# Patient Record
Sex: Female | Born: 1965 | Race: Black or African American | Hispanic: Refuse to answer
Health system: Midwestern US, Community
[De-identification: ages and names within clinical notes are randomized; demographics above are authoritative.]

## PROBLEM LIST (undated history)

## (undated) DIAGNOSIS — I10 Essential (primary) hypertension: Secondary | ICD-10-CM

## (undated) DIAGNOSIS — Z973 Presence of spectacles and contact lenses: Secondary | ICD-10-CM

## (undated) DIAGNOSIS — D563 Thalassemia minor: Secondary | ICD-10-CM

## (undated) DIAGNOSIS — Z87442 Personal history of urinary calculi: Secondary | ICD-10-CM

## (undated) DIAGNOSIS — C50919 Malignant neoplasm of unspecified site of unspecified female breast: Secondary | ICD-10-CM

## (undated) DIAGNOSIS — K219 Gastro-esophageal reflux disease without esophagitis: Secondary | ICD-10-CM

## (undated) DIAGNOSIS — F319 Bipolar disorder, unspecified: Secondary | ICD-10-CM

## (undated) DIAGNOSIS — Z8719 Personal history of other diseases of the digestive system: Secondary | ICD-10-CM

## (undated) DIAGNOSIS — R51 Headache: Secondary | ICD-10-CM

## (undated) DIAGNOSIS — K859 Acute pancreatitis without necrosis or infection, unspecified: Secondary | ICD-10-CM

## (undated) DIAGNOSIS — U071 COVID-19: Secondary | ICD-10-CM

## (undated) DIAGNOSIS — I639 Cerebral infarction, unspecified: Secondary | ICD-10-CM

## (undated) DIAGNOSIS — I2510 Atherosclerotic heart disease of native coronary artery without angina pectoris: Secondary | ICD-10-CM

## (undated) DIAGNOSIS — IMO0001 Reserved for inherently not codable concepts without codable children: Secondary | ICD-10-CM

## (undated) DIAGNOSIS — T8859XA Other complications of anesthesia, initial encounter: Secondary | ICD-10-CM

## (undated) DIAGNOSIS — J45909 Unspecified asthma, uncomplicated: Secondary | ICD-10-CM

## (undated) DIAGNOSIS — R519 Headache, unspecified: Secondary | ICD-10-CM

## (undated) DIAGNOSIS — N200 Calculus of kidney: Secondary | ICD-10-CM

## (undated) DIAGNOSIS — D571 Sickle-cell disease without crisis: Secondary | ICD-10-CM

## (undated) DIAGNOSIS — M1990 Unspecified osteoarthritis, unspecified site: Secondary | ICD-10-CM

## (undated) DIAGNOSIS — J189 Pneumonia, unspecified organism: Secondary | ICD-10-CM

## (undated) DIAGNOSIS — J40 Bronchitis, not specified as acute or chronic: Secondary | ICD-10-CM

## (undated) DIAGNOSIS — G4730 Sleep apnea, unspecified: Secondary | ICD-10-CM

## (undated) DIAGNOSIS — F329 Major depressive disorder, single episode, unspecified: Secondary | ICD-10-CM

## (undated) DIAGNOSIS — F32A Depression, unspecified: Secondary | ICD-10-CM

## (undated) DIAGNOSIS — F450 Somatization disorder: Secondary | ICD-10-CM

## (undated) DIAGNOSIS — F419 Anxiety disorder, unspecified: Secondary | ICD-10-CM

## (undated) HISTORY — PX: TUBAL LIGATION: SHX77

## (undated) HISTORY — PX: PARTIAL HYSTERECTOMY: SHX80

## (undated) HISTORY — DX: Gastro-esophageal reflux disease without esophagitis: K21.9

## (undated) HISTORY — PX: MASTECTOMY: SHX3

## (undated) HISTORY — PX: CHOLECYSTECTOMY: SHX55

## (undated) HISTORY — PX: BREAST LUMPECTOMY: SHX2

## (undated) HISTORY — PX: PORT A CATH REVISION: SHX6033

## (undated) HISTORY — PX: CARDIAC CATHETERIZATION: SHX172

## (undated) HISTORY — PX: COLONOSCOPY W/ BIOPSIES AND POLYPECTOMY: SHX1376

---

## 1999-06-15 DIAGNOSIS — N951 Menopausal and female climacteric states: Secondary | ICD-10-CM | POA: Insufficient documentation

## 2000-06-17 DIAGNOSIS — K861 Other chronic pancreatitis: Secondary | ICD-10-CM | POA: Insufficient documentation

## 2000-10-04 DIAGNOSIS — B9681 Helicobacter pylori [H. pylori] as the cause of diseases classified elsewhere: Secondary | ICD-10-CM | POA: Insufficient documentation

## 2000-11-07 DIAGNOSIS — K279 Peptic ulcer, site unspecified, unspecified as acute or chronic, without hemorrhage or perforation: Secondary | ICD-10-CM | POA: Insufficient documentation

## 2001-10-30 DIAGNOSIS — S339XXA Sprain of unspecified parts of lumbar spine and pelvis, initial encounter: Secondary | ICD-10-CM | POA: Insufficient documentation

## 2001-10-30 DIAGNOSIS — S139XXA Sprain of joints and ligaments of unspecified parts of neck, initial encounter: Secondary | ICD-10-CM | POA: Insufficient documentation

## 2007-11-11 DIAGNOSIS — I10 Essential (primary) hypertension: Secondary | ICD-10-CM | POA: Insufficient documentation

## 2007-11-20 DIAGNOSIS — D571 Sickle-cell disease without crisis: Secondary | ICD-10-CM | POA: Insufficient documentation

## 2007-11-20 DIAGNOSIS — F1910 Other psychoactive substance abuse, uncomplicated: Secondary | ICD-10-CM | POA: Insufficient documentation

## 2007-11-26 ENCOUNTER — Ambulatory Visit: Payer: Self-pay | Admitting: Pulmonary Disease

## 2007-11-26 ENCOUNTER — Emergency Department (HOSPITAL_COMMUNITY): Admission: EM | Admit: 2007-11-26 | Discharge: 2007-11-26 | Payer: Self-pay | Admitting: Emergency Medicine

## 2007-11-26 DIAGNOSIS — L02219 Cutaneous abscess of trunk, unspecified: Secondary | ICD-10-CM | POA: Insufficient documentation

## 2007-11-26 DIAGNOSIS — R0602 Shortness of breath: Secondary | ICD-10-CM | POA: Insufficient documentation

## 2007-11-26 DIAGNOSIS — I10 Essential (primary) hypertension: Secondary | ICD-10-CM | POA: Insufficient documentation

## 2007-11-26 DIAGNOSIS — D568 Other thalassemias: Secondary | ICD-10-CM | POA: Insufficient documentation

## 2007-11-26 DIAGNOSIS — F17200 Nicotine dependence, unspecified, uncomplicated: Secondary | ICD-10-CM | POA: Insufficient documentation

## 2007-11-26 DIAGNOSIS — I2510 Atherosclerotic heart disease of native coronary artery without angina pectoris: Secondary | ICD-10-CM | POA: Insufficient documentation

## 2007-11-26 DIAGNOSIS — L03319 Cellulitis of trunk, unspecified: Secondary | ICD-10-CM

## 2007-12-16 ENCOUNTER — Ambulatory Visit: Payer: Self-pay | Admitting: Pulmonary Disease

## 2007-12-16 ENCOUNTER — Inpatient Hospital Stay (HOSPITAL_COMMUNITY): Admission: AD | Admit: 2007-12-16 | Discharge: 2007-12-25 | Payer: Self-pay | Admitting: Pulmonary Disease

## 2007-12-17 ENCOUNTER — Ambulatory Visit: Payer: Self-pay | Admitting: Gastroenterology

## 2007-12-18 ENCOUNTER — Encounter: Payer: Self-pay | Admitting: Gastroenterology

## 2008-01-13 ENCOUNTER — Ambulatory Visit: Payer: Self-pay | Admitting: Gastroenterology

## 2008-01-14 ENCOUNTER — Ambulatory Visit: Payer: Self-pay | Admitting: Pulmonary Disease

## 2008-01-14 DIAGNOSIS — K3184 Gastroparesis: Secondary | ICD-10-CM | POA: Insufficient documentation

## 2008-01-19 ENCOUNTER — Encounter: Payer: Self-pay | Admitting: Gastroenterology

## 2008-01-22 ENCOUNTER — Ambulatory Visit: Payer: Self-pay | Admitting: Pulmonary Disease

## 2008-01-22 ENCOUNTER — Emergency Department (HOSPITAL_COMMUNITY): Admission: EM | Admit: 2008-01-22 | Discharge: 2008-01-22 | Payer: Self-pay | Admitting: Emergency Medicine

## 2008-01-22 ENCOUNTER — Telehealth: Payer: Self-pay | Admitting: Emergency Medicine

## 2008-02-09 ENCOUNTER — Ambulatory Visit: Payer: Self-pay | Admitting: Gastroenterology

## 2008-02-09 ENCOUNTER — Encounter (INDEPENDENT_AMBULATORY_CARE_PROVIDER_SITE_OTHER): Payer: Self-pay | Admitting: *Deleted

## 2008-02-09 ENCOUNTER — Ambulatory Visit: Payer: Self-pay | Admitting: Cardiology

## 2008-02-09 DIAGNOSIS — Z860100 Personal history of colon polyps, unspecified: Secondary | ICD-10-CM | POA: Insufficient documentation

## 2008-02-09 DIAGNOSIS — R1013 Epigastric pain: Secondary | ICD-10-CM | POA: Insufficient documentation

## 2008-02-10 ENCOUNTER — Telehealth: Payer: Self-pay | Admitting: Gastroenterology

## 2008-02-10 ENCOUNTER — Encounter (INDEPENDENT_AMBULATORY_CARE_PROVIDER_SITE_OTHER): Payer: Self-pay | Admitting: Internal Medicine

## 2008-02-10 ENCOUNTER — Ambulatory Visit: Payer: Self-pay | Admitting: Surgery

## 2008-02-11 ENCOUNTER — Inpatient Hospital Stay (HOSPITAL_COMMUNITY): Admission: EM | Admit: 2008-02-11 | Discharge: 2008-02-12 | Payer: Self-pay | Admitting: Emergency Medicine

## 2008-02-16 ENCOUNTER — Telehealth: Payer: Self-pay | Admitting: Gastroenterology

## 2008-03-01 ENCOUNTER — Telehealth: Payer: Self-pay | Admitting: Gastroenterology

## 2008-04-26 ENCOUNTER — Emergency Department (HOSPITAL_COMMUNITY): Admission: EM | Admit: 2008-04-26 | Discharge: 2008-04-26 | Payer: Self-pay | Admitting: Emergency Medicine

## 2008-05-10 ENCOUNTER — Telehealth (INDEPENDENT_AMBULATORY_CARE_PROVIDER_SITE_OTHER): Payer: Self-pay | Admitting: *Deleted

## 2008-05-16 ENCOUNTER — Ambulatory Visit: Payer: Self-pay | Admitting: Internal Medicine

## 2008-05-16 ENCOUNTER — Inpatient Hospital Stay (HOSPITAL_COMMUNITY): Admission: EM | Admit: 2008-05-16 | Discharge: 2008-05-18 | Payer: Self-pay | Admitting: *Deleted

## 2008-05-17 ENCOUNTER — Encounter (INDEPENDENT_AMBULATORY_CARE_PROVIDER_SITE_OTHER): Payer: Self-pay | Admitting: *Deleted

## 2008-06-05 ENCOUNTER — Emergency Department (HOSPITAL_COMMUNITY): Admission: EM | Admit: 2008-06-05 | Discharge: 2008-06-05 | Payer: Self-pay | Admitting: Emergency Medicine

## 2008-06-19 ENCOUNTER — Inpatient Hospital Stay (HOSPITAL_COMMUNITY): Admission: EM | Admit: 2008-06-19 | Discharge: 2008-06-22 | Payer: Self-pay | Admitting: Emergency Medicine

## 2008-06-23 ENCOUNTER — Ambulatory Visit: Payer: Self-pay | Admitting: Gastroenterology

## 2008-07-01 ENCOUNTER — Emergency Department (HOSPITAL_COMMUNITY): Admission: EM | Admit: 2008-07-01 | Discharge: 2008-07-01 | Payer: Self-pay | Admitting: Emergency Medicine

## 2008-07-16 ENCOUNTER — Ambulatory Visit (HOSPITAL_COMMUNITY): Admission: RE | Admit: 2008-07-16 | Discharge: 2008-07-16 | Payer: Self-pay | Admitting: Cardiovascular Disease

## 2008-08-10 ENCOUNTER — Observation Stay (HOSPITAL_COMMUNITY): Admission: EM | Admit: 2008-08-10 | Discharge: 2008-08-11 | Payer: Self-pay | Admitting: Emergency Medicine

## 2008-08-23 ENCOUNTER — Observation Stay (HOSPITAL_COMMUNITY): Admission: EM | Admit: 2008-08-23 | Discharge: 2008-08-23 | Payer: Self-pay | Admitting: Emergency Medicine

## 2008-08-25 ENCOUNTER — Inpatient Hospital Stay (HOSPITAL_COMMUNITY): Admission: EM | Admit: 2008-08-25 | Discharge: 2008-08-26 | Payer: Self-pay | Admitting: Emergency Medicine

## 2008-08-31 ENCOUNTER — Inpatient Hospital Stay (HOSPITAL_COMMUNITY): Admission: EM | Admit: 2008-08-31 | Discharge: 2008-09-05 | Payer: Self-pay | Admitting: Emergency Medicine

## 2008-09-15 ENCOUNTER — Encounter: Payer: Self-pay | Admitting: Emergency Medicine

## 2008-09-16 ENCOUNTER — Ambulatory Visit: Payer: Self-pay | Admitting: Psychiatry

## 2008-09-16 ENCOUNTER — Inpatient Hospital Stay (HOSPITAL_COMMUNITY): Admission: AD | Admit: 2008-09-16 | Discharge: 2008-09-17 | Payer: Self-pay | Admitting: Psychiatry

## 2008-09-18 ENCOUNTER — Emergency Department (HOSPITAL_COMMUNITY): Admission: EM | Admit: 2008-09-18 | Discharge: 2008-09-18 | Payer: Self-pay | Admitting: Emergency Medicine

## 2008-10-15 ENCOUNTER — Emergency Department (HOSPITAL_COMMUNITY): Admission: EM | Admit: 2008-10-15 | Discharge: 2008-10-15 | Payer: Self-pay | Admitting: Emergency Medicine

## 2008-11-02 ENCOUNTER — Emergency Department (HOSPITAL_COMMUNITY): Admission: EM | Admit: 2008-11-02 | Discharge: 2008-11-02 | Payer: Self-pay | Admitting: Emergency Medicine

## 2008-11-17 ENCOUNTER — Emergency Department (HOSPITAL_COMMUNITY): Admission: EM | Admit: 2008-11-17 | Discharge: 2008-11-17 | Payer: Self-pay | Admitting: Emergency Medicine

## 2008-11-23 ENCOUNTER — Emergency Department (HOSPITAL_COMMUNITY): Admission: EM | Admit: 2008-11-23 | Discharge: 2008-11-23 | Payer: Self-pay | Admitting: Emergency Medicine

## 2008-11-25 ENCOUNTER — Emergency Department (HOSPITAL_COMMUNITY): Admission: EM | Admit: 2008-11-25 | Discharge: 2008-11-25 | Payer: Self-pay | Admitting: Emergency Medicine

## 2008-11-30 ENCOUNTER — Emergency Department (HOSPITAL_COMMUNITY): Admission: EM | Admit: 2008-11-30 | Discharge: 2008-11-30 | Payer: Self-pay | Admitting: Emergency Medicine

## 2009-02-14 ENCOUNTER — Inpatient Hospital Stay (HOSPITAL_COMMUNITY): Admission: EM | Admit: 2009-02-14 | Discharge: 2009-02-15 | Payer: Self-pay | Admitting: Emergency Medicine

## 2009-02-14 ENCOUNTER — Ambulatory Visit: Payer: Self-pay | Admitting: Family Medicine

## 2009-03-09 ENCOUNTER — Inpatient Hospital Stay (HOSPITAL_COMMUNITY): Admission: EM | Admit: 2009-03-09 | Discharge: 2009-03-11 | Payer: Self-pay | Admitting: Emergency Medicine

## 2009-03-16 ENCOUNTER — Emergency Department (HOSPITAL_COMMUNITY): Admission: EM | Admit: 2009-03-16 | Discharge: 2009-03-16 | Payer: Self-pay | Admitting: Emergency Medicine

## 2009-03-26 ENCOUNTER — Emergency Department (HOSPITAL_COMMUNITY): Admission: EM | Admit: 2009-03-26 | Discharge: 2009-03-26 | Payer: Self-pay | Admitting: Emergency Medicine

## 2009-03-31 ENCOUNTER — Emergency Department (HOSPITAL_COMMUNITY): Admission: EM | Admit: 2009-03-31 | Discharge: 2009-03-31 | Payer: Self-pay | Admitting: Family Medicine

## 2009-04-20 ENCOUNTER — Observation Stay (HOSPITAL_COMMUNITY): Admission: EM | Admit: 2009-04-20 | Discharge: 2009-04-21 | Payer: Self-pay | Admitting: Emergency Medicine

## 2009-04-20 HISTORY — DX: Depression, unspecified: F32.A

## 2009-04-20 HISTORY — DX: Anxiety disorder, unspecified: F41.9

## 2009-04-20 HISTORY — DX: Thalassemia minor: D56.3

## 2009-04-20 HISTORY — DX: Somatization disorder: F45.0

## 2009-04-20 HISTORY — DX: Major depressive disorder, single episode, unspecified: F32.9

## 2009-04-27 ENCOUNTER — Emergency Department (HOSPITAL_COMMUNITY): Admission: EM | Admit: 2009-04-27 | Discharge: 2009-04-28 | Payer: Self-pay | Admitting: Emergency Medicine

## 2009-05-08 ENCOUNTER — Emergency Department (HOSPITAL_COMMUNITY): Admission: EM | Admit: 2009-05-08 | Discharge: 2009-05-08 | Payer: Self-pay | Admitting: Emergency Medicine

## 2009-05-11 ENCOUNTER — Emergency Department (HOSPITAL_COMMUNITY): Admission: EM | Admit: 2009-05-11 | Discharge: 2009-05-11 | Payer: Self-pay | Admitting: Family Medicine

## 2009-05-17 ENCOUNTER — Emergency Department (HOSPITAL_COMMUNITY): Admission: EM | Admit: 2009-05-17 | Discharge: 2009-05-17 | Payer: Self-pay | Admitting: Emergency Medicine

## 2009-05-25 ENCOUNTER — Emergency Department (HOSPITAL_COMMUNITY): Admission: EM | Admit: 2009-05-25 | Discharge: 2009-05-25 | Payer: Self-pay | Admitting: Emergency Medicine

## 2009-05-31 ENCOUNTER — Ambulatory Visit: Payer: Medicare Other | Admitting: Internal Medicine

## 2009-06-07 ENCOUNTER — Inpatient Hospital Stay: Payer: Medicare Other | Admitting: Internal Medicine

## 2009-06-14 ENCOUNTER — Emergency Department (HOSPITAL_COMMUNITY): Admission: EM | Admit: 2009-06-14 | Discharge: 2009-06-14 | Payer: Self-pay | Admitting: Emergency Medicine

## 2009-06-28 ENCOUNTER — Ambulatory Visit: Payer: Medicare Other | Admitting: Internal Medicine

## 2009-08-09 ENCOUNTER — Emergency Department (HOSPITAL_COMMUNITY): Admission: EM | Admit: 2009-08-09 | Discharge: 2009-08-09 | Payer: Self-pay | Admitting: Emergency Medicine

## 2009-09-21 DIAGNOSIS — I2510 Atherosclerotic heart disease of native coronary artery without angina pectoris: Secondary | ICD-10-CM | POA: Insufficient documentation

## 2009-11-18 IMAGING — CR DG CHEST 2V
2 series · 2 of 2 positions shown · non-contrast
Comparison: 06/19/2008 and earlier.

CLINICAL DATA: 42-year-old female with chest pain, body aches and
fever.
Sickle cell disease.

CHEST - 2 VIEW

[w chest pa]
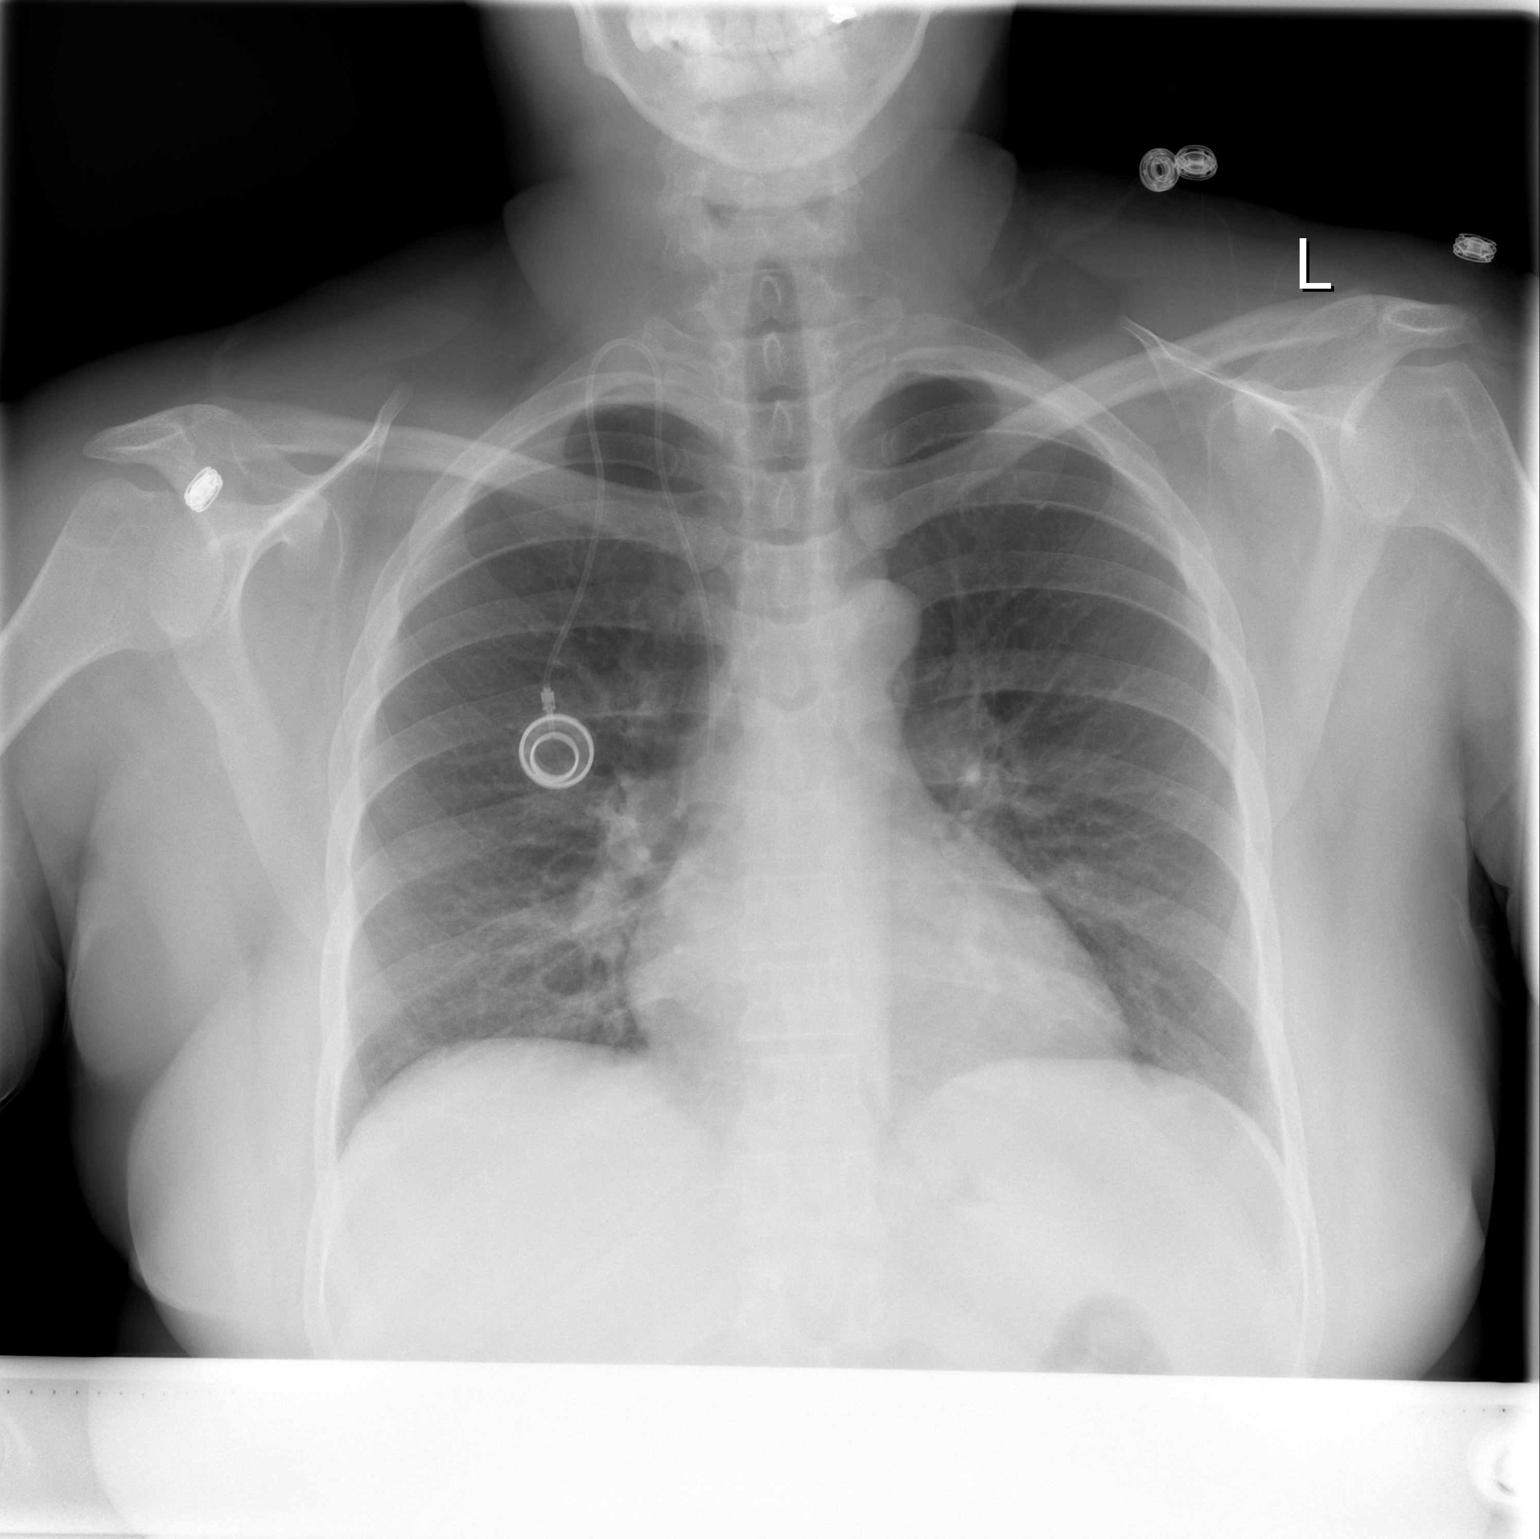

[w chest lat]
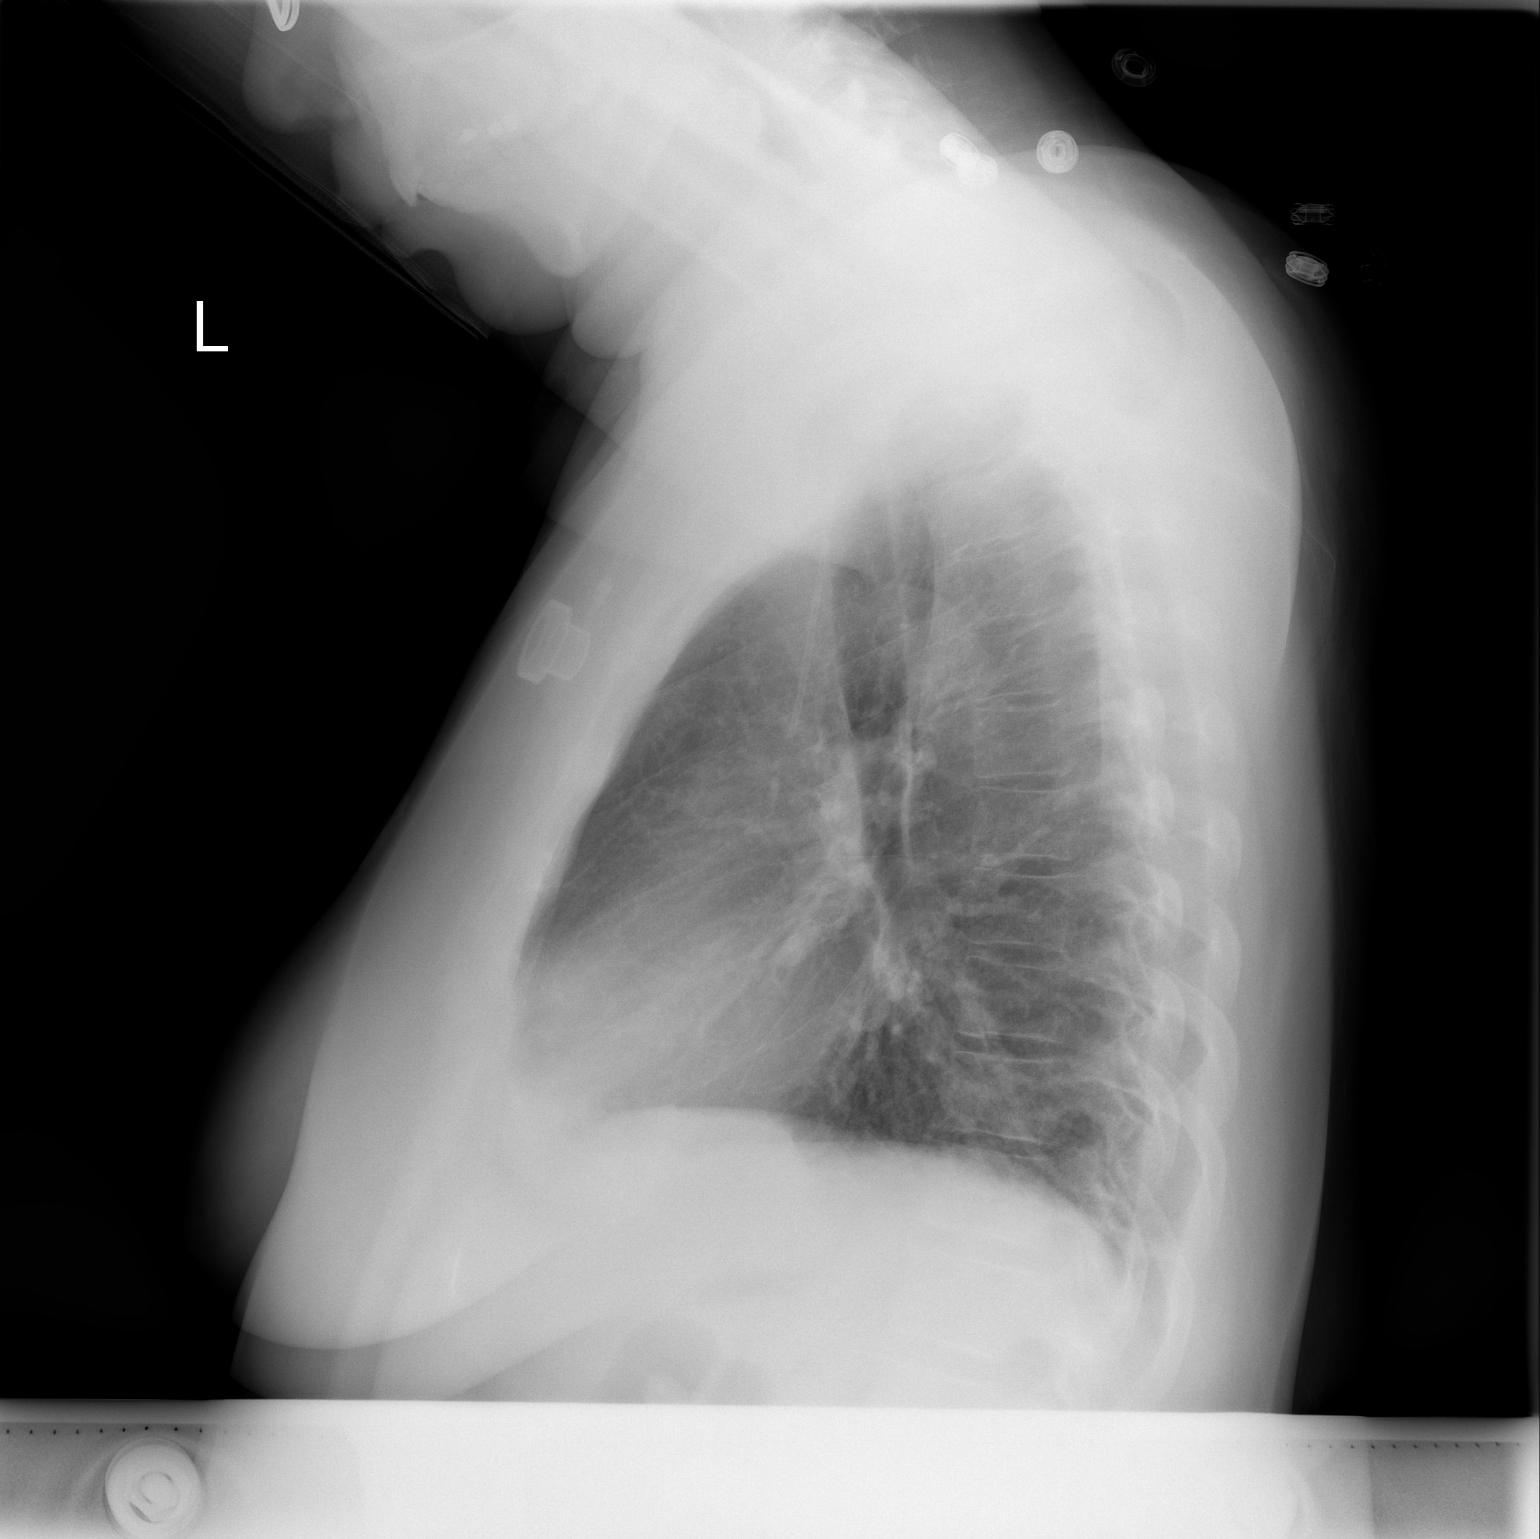

[2 of 2 positions shown; findings below may reference images not displayed]

FINDINGS: Stable right chest Port-A-Cath.  Slightly better lung
inflation.  Stable cardiac size and mediastinal contours.  Cardiac
size within normal limits.  No pneumothorax, pulmonary edema,
pleural effusion or confluent airspace opacity.  Mild chronic
increased interstitial markings/vascular congestion is stable. No
acute osseous abnormality identified.
IMPRESSION: Stable. No acute cardiopulmonary abnormality.

## 2009-12-22 DIAGNOSIS — G8929 Other chronic pain: Secondary | ICD-10-CM | POA: Insufficient documentation

## 2009-12-22 DIAGNOSIS — E876 Hypokalemia: Secondary | ICD-10-CM | POA: Insufficient documentation

## 2009-12-22 DIAGNOSIS — R112 Nausea with vomiting, unspecified: Secondary | ICD-10-CM | POA: Insufficient documentation

## 2009-12-22 DIAGNOSIS — F1120 Opioid dependence, uncomplicated: Secondary | ICD-10-CM | POA: Insufficient documentation

## 2009-12-22 DIAGNOSIS — K219 Gastro-esophageal reflux disease without esophagitis: Secondary | ICD-10-CM | POA: Insufficient documentation

## 2009-12-22 HISTORY — DX: Nausea with vomiting, unspecified: R11.2

## 2010-05-21 ENCOUNTER — Encounter: Payer: Self-pay | Admitting: Gastroenterology

## 2010-05-21 ENCOUNTER — Encounter: Payer: Self-pay | Admitting: Obstetrics

## 2010-07-16 LAB — COMPREHENSIVE METABOLIC PANEL
ALT: 12 U/L (ref 0–35)
AST: 14 U/L (ref 0–37)
Albumin: 3.8 g/dL (ref 3.5–5.2)
Albumin: 3.8 g/dL (ref 3.5–5.2)
BUN: 9 mg/dL (ref 6–23)
CO2: 24 mEq/L (ref 19–32)
Chloride: 101 mEq/L (ref 96–112)
Chloride: 107 mEq/L (ref 96–112)
Creatinine, Ser: 0.56 mg/dL (ref 0.4–1.2)
GFR calc Af Amer: >60 mL/min (ref >60)
GFR calc non Af Amer: >60 mL/min (ref >60)
Sodium: 133 mEq/L — ABNORMAL LOW (ref 135–145)
Total Bilirubin: 0.5 mg/dL (ref 0.3–1.2)
Total Bilirubin: 0.8 mg/dL (ref 0.3–1.2)

## 2010-07-16 LAB — URINALYSIS, ROUTINE W REFLEX MICROSCOPIC
Hgb urine dipstick: NEGATIVE
Nitrite: NEGATIVE
Specific Gravity, Urine: 1.019 (ref 1.005–1.030)
Urobilinogen, UA: 0.2 mg/dL (ref 0.0–1.0)

## 2010-07-16 LAB — CBC
HCT: 30 % — ABNORMAL LOW (ref 36.0–46.0)
HCT: 30.3 % — ABNORMAL LOW (ref 36.0–46.0)
Hemoglobin: 9.8 g/dL — ABNORMAL LOW (ref 12.0–15.0)
Hemoglobin: 9.9 g/dL — ABNORMAL LOW (ref 12.0–15.0)
MCHC: 32.4 g/dL (ref 30.0–36.0)
MCHC: 33 g/dL (ref 30.0–36.0)
MCV: 71.5 fL — ABNORMAL LOW (ref 78.0–100.0)
MCV: 71.7 fL — ABNORMAL LOW (ref 78.0–100.0)
Platelets: 329 10*3/uL (ref 150–400)
Platelets: 409 10*3/uL — ABNORMAL HIGH (ref 150–400)
RBC: 4.2 MIL/uL (ref 3.87–5.11)
RBC: 4.22 MIL/uL (ref 3.87–5.11)
RDW: 18.5 % — ABNORMAL HIGH (ref 11.5–15.5)
RDW: 19.2 % — ABNORMAL HIGH (ref 11.5–15.5)
WBC: 6.2 10*3/uL (ref 4.0–10.5)
WBC: 6.7 10*3/uL (ref 4.0–10.5)

## 2010-07-16 LAB — DIFFERENTIAL
Basophils Absolute: 0 10*3/uL (ref 0.0–0.1)
Basophils Absolute: 0 10*3/uL (ref 0.0–0.1)
Eosinophils Relative: 1 % (ref 0–5)
Lymphocytes Relative: 34 % (ref 12–46)
Lymphs Abs: 2.5 10*3/uL (ref 0.7–4.0)
Monocytes Absolute: 0.4 10*3/uL (ref 0.1–1.0)
Monocytes Relative: 6 % (ref 3–12)
Neutro Abs: 3.7 10*3/uL (ref 1.7–7.7)
Neutro Abs: 3.7 10*3/uL (ref 1.7–7.7)
Neutrophils Relative %: 59 % (ref 43–77)

## 2010-07-16 LAB — POCT CARDIAC MARKERS
CKMB, poc: <1 ng/mL — ABNORMAL LOW (ref 1.0–8.0)
Myoglobin, poc: 44.3 ng/mL (ref 12–200)
Troponin i, poc: <0.05 ng/mL (ref 0.00–0.09)

## 2010-07-16 LAB — LIPASE, BLOOD
Lipase: 30 U/L (ref 11–59)
Lipase: 31 U/L (ref 11–59)

## 2010-07-16 LAB — RAPID URINE DRUG SCREEN, HOSP PERFORMED
Barbiturates: NOT DETECTED
Opiates: NOT DETECTED

## 2010-07-16 LAB — STREP A DNA PROBE

## 2010-07-16 LAB — POCT PREGNANCY, URINE: Preg Test, Ur: NEGATIVE

## 2010-07-19 LAB — URINE MICROSCOPIC-ADD ON

## 2010-07-19 LAB — URINALYSIS, ROUTINE W REFLEX MICROSCOPIC
Glucose, UA: NEGATIVE mg/dL
Glucose, UA: NEGATIVE mg/dL
Protein, ur: NEGATIVE mg/dL
Specific Gravity, Urine: 1.03 (ref 1.005–1.030)
Specific Gravity, Urine: 1.024 (ref 1.005–1.030)
Urobilinogen, UA: 1 mg/dL (ref 0.0–1.0)
Urobilinogen, UA: 1 mg/dL (ref 0.0–1.0)

## 2010-07-19 LAB — DIFFERENTIAL
Basophils Absolute: 0 10*3/uL (ref 0.0–0.1)
Lymphs Abs: 2.4 10*3/uL (ref 0.7–4.0)
Monocytes Absolute: 0.6 10*3/uL (ref 0.1–1.0)
Monocytes Relative: 8 % (ref 3–12)
Neutro Abs: 3.9 10*3/uL (ref 1.7–7.7)

## 2010-07-19 LAB — COMPREHENSIVE METABOLIC PANEL
ALT: 14 U/L (ref 0–35)
AST: 15 U/L (ref 0–37)
Albumin: 3.9 g/dL (ref 3.5–5.2)
Alkaline Phosphatase: 53 U/L (ref 39–117)
CO2: 22 mEq/L (ref 19–32)
Chloride: 107 mEq/L (ref 96–112)
Creatinine, Ser: 0.68 mg/dL (ref 0.4–1.2)
GFR calc Af Amer: >60 mL/min (ref >60)
GFR calc non Af Amer: >60 mL/min (ref >60)
Potassium: 3.4 mEq/L — ABNORMAL LOW (ref 3.5–5.1)
Sodium: 137 mEq/L (ref 135–145)
Total Bilirubin: 0.8 mg/dL (ref 0.3–1.2)

## 2010-07-19 LAB — PREGNANCY, URINE: Preg Test, Ur: NEGATIVE

## 2010-07-19 LAB — CBC
Platelets: 355 10*3/uL (ref 150–400)
RBC: 4.02 MIL/uL (ref 3.87–5.11)
WBC: 6.9 10*3/uL (ref 4.0–10.5)

## 2010-07-19 LAB — POCT CARDIAC MARKERS: Myoglobin, poc: 58.2 ng/mL (ref 12–200)

## 2010-07-31 LAB — DIFFERENTIAL
Band Neutrophils: 0 % (ref 0–10)
Basophils Absolute: 0 10*3/uL (ref 0.0–0.1)
Basophils Relative: 0 % (ref 0–1)
Basophils Relative: 0 % (ref 0–1)
Blasts: 0 %
Eosinophils Absolute: 0.1 10*3/uL (ref 0.0–0.7)
Eosinophils Relative: 1 % (ref 0–5)
Metamyelocytes Relative: 0 %
Monocytes Absolute: 0.5 10*3/uL (ref 0.1–1.0)
Monocytes Relative: 7 % (ref 3–12)
Promyelocytes Absolute: 0 %

## 2010-07-31 LAB — COMPREHENSIVE METABOLIC PANEL
ALT: 15 U/L (ref 0–35)
AST: 22 U/L (ref 0–37)
Albumin: 4 g/dL (ref 3.5–5.2)
Albumin: 4.5 g/dL (ref 3.5–5.2)
Alkaline Phosphatase: 56 U/L (ref 39–117)
BUN: 7 mg/dL (ref 6–23)
Chloride: 105 mEq/L (ref 96–112)
Creatinine, Ser: 0.63 mg/dL (ref 0.4–1.2)
GFR calc Af Amer: >60 mL/min (ref >60)
Potassium: 3.1 mEq/L — ABNORMAL LOW (ref 3.5–5.1)
Potassium: 3.6 mEq/L (ref 3.5–5.1)
Sodium: 137 mEq/L (ref 135–145)
Total Bilirubin: 1.1 mg/dL (ref 0.3–1.2)
Total Protein: 7 g/dL (ref 6.0–8.3)

## 2010-07-31 LAB — CBC
HCT: 33.9 % — ABNORMAL LOW (ref 36.0–46.0)
Hemoglobin: 9.6 g/dL — ABNORMAL LOW (ref 12.0–15.0)
MCHC: 31.4 g/dL (ref 30.0–36.0)
MCV: 72.7 fL — ABNORMAL LOW (ref 78.0–100.0)
Platelets: 360 10*3/uL (ref 150–400)
RBC: 4.2 MIL/uL (ref 3.87–5.11)
WBC: 7.9 10*3/uL (ref 4.0–10.5)

## 2010-07-31 LAB — CK TOTAL AND CKMB (NOT AT ARMC)
CK, MB: 0.9 ng/mL (ref 0.3–4.0)
CK, MB: 0.9 ng/mL (ref 0.3–4.0)
Total CK: 53 U/L (ref 7–177)
Total CK: 64 U/L (ref 7–177)

## 2010-07-31 LAB — BASIC METABOLIC PANEL
Calcium: 8.5 mg/dL (ref 8.4–10.5)
GFR calc Af Amer: >60 mL/min (ref >60)
GFR calc non Af Amer: >60 mL/min (ref >60)
Glucose, Bld: 146 mg/dL — ABNORMAL HIGH (ref 70–99)
Sodium: 133 mEq/L — ABNORMAL LOW (ref 135–145)

## 2010-07-31 LAB — TECHNOLOGIST SMEAR REVIEW

## 2010-07-31 LAB — URINALYSIS, ROUTINE W REFLEX MICROSCOPIC
Hgb urine dipstick: NEGATIVE
Nitrite: NEGATIVE
Protein, ur: NEGATIVE mg/dL
Specific Gravity, Urine: 1.031 — ABNORMAL HIGH (ref 1.005–1.030)
Urobilinogen, UA: 1 mg/dL (ref 0.0–1.0)

## 2010-07-31 LAB — LIPID PANEL
HDL: 34 mg/dL — ABNORMAL LOW (ref >39)
Triglycerides: 71 mg/dL (ref <150)

## 2010-07-31 LAB — CARDIAC PANEL(CRET KIN+CKTOT+MB+TROPI): Troponin I: 0.01 ng/mL (ref 0.00–0.06)

## 2010-07-31 LAB — RETICULOCYTES
RBC.: 3.81 MIL/uL — ABNORMAL LOW (ref 3.87–5.11)
Retic Count, Absolute: 57.2 10*3/uL (ref 19.0–186.0)
Retic Ct Pct: 1.5 % (ref 0.4–3.1)

## 2010-07-31 LAB — LIPASE, BLOOD: Lipase: 37 U/L (ref 11–59)

## 2010-07-31 LAB — HCG, QUANTITATIVE, PREGNANCY: hCG, Beta Chain, Quant, S: 12 m[IU]/mL — ABNORMAL HIGH (ref <5)

## 2010-07-31 LAB — TROPONIN I: Troponin I: 0.01 ng/mL (ref 0.00–0.06)

## 2010-08-02 LAB — CBC
HCT: 28.5 % — ABNORMAL LOW (ref 36.0–46.0)
HCT: 26.9 % — ABNORMAL LOW (ref 36.0–46.0)
Hemoglobin: 9.3 g/dL — ABNORMAL LOW (ref 12.0–15.0)
Hemoglobin: 8.7 g/dL — ABNORMAL LOW (ref 12.0–15.0)
Hemoglobin: 9.8 g/dL — ABNORMAL LOW (ref 12.0–15.0)
MCHC: 32.6 g/dL (ref 30.0–36.0)
MCHC: 32.8 g/dL (ref 30.0–36.0)
MCV: 70.7 fL — ABNORMAL LOW (ref 78.0–100.0)
MCV: 71.1 fL — ABNORMAL LOW (ref 78.0–100.0)
Platelets: 304 10*3/uL (ref 150–400)
Platelets: 320 10*3/uL (ref 150–400)
RBC: 3.81 MIL/uL — ABNORMAL LOW (ref 3.87–5.11)
RBC: 4.34 MIL/uL (ref 3.87–5.11)
RDW: 18.3 % — ABNORMAL HIGH (ref 11.5–15.5)
RDW: 18.2 % — ABNORMAL HIGH (ref 11.5–15.5)
WBC: 6 10*3/uL (ref 4.0–10.5)

## 2010-08-02 LAB — CK TOTAL AND CKMB (NOT AT ARMC)
CK, MB: 6.4 ng/mL — ABNORMAL HIGH (ref 0.3–4.0)
Relative Index: 3.5 — ABNORMAL HIGH (ref 0.0–2.5)
Total CK: 184 U/L — ABNORMAL HIGH (ref 7–177)

## 2010-08-02 LAB — DIFFERENTIAL
Basophils Relative: 1 % (ref 0–1)
Basophils Relative: 1 % (ref 0–1)
Eosinophils Absolute: 0 10*3/uL (ref 0.0–0.7)
Eosinophils Relative: 2 % (ref 0–5)
Eosinophils Relative: 0 % (ref 0–5)
Lymphocytes Relative: 27 % (ref 12–46)
Lymphocytes Relative: 33 % (ref 12–46)
Lymphs Abs: 1.8 10*3/uL (ref 0.7–4.0)
Monocytes Absolute: 0.6 10*3/uL (ref 0.1–1.0)
Monocytes Absolute: 0.5 10*3/uL (ref 0.1–1.0)
Monocytes Relative: 5 % (ref 3–12)
Monocytes Relative: 6 % (ref 3–12)
Neutro Abs: 4.4 10*3/uL (ref 1.7–7.7)
Neutrophils Relative %: 61 % (ref 43–77)
Neutrophils Relative %: 60 % (ref 43–77)

## 2010-08-02 LAB — URINALYSIS, ROUTINE W REFLEX MICROSCOPIC
Glucose, UA: NEGATIVE mg/dL
Protein, ur: NEGATIVE mg/dL
Specific Gravity, Urine: 1.017 (ref 1.005–1.030)
Urobilinogen, UA: 0.2 mg/dL (ref 0.0–1.0)

## 2010-08-02 LAB — CARDIAC PANEL(CRET KIN+CKTOT+MB+TROPI)
Relative Index: 1.4 (ref 0.0–2.5)
Relative Index: 2.5 (ref 0.0–2.5)
Total CK: 185 U/L — ABNORMAL HIGH (ref 7–177)
Total CK: 203 U/L — ABNORMAL HIGH (ref 7–177)
Troponin I: <0.01 ng/mL (ref 0.00–0.06)

## 2010-08-02 LAB — COMPREHENSIVE METABOLIC PANEL
ALT: 15 U/L (ref 0–35)
ALT: 16 U/L (ref 0–35)
AST: 26 U/L (ref 0–37)
Albumin: 3.7 g/dL (ref 3.5–5.2)
Albumin: 3.9 g/dL (ref 3.5–5.2)
Albumin: 3.7 g/dL (ref 3.5–5.2)
Alkaline Phosphatase: 47 U/L (ref 39–117)
Alkaline Phosphatase: 49 U/L (ref 39–117)
Alkaline Phosphatase: 57 U/L (ref 39–117)
BUN: 10 mg/dL (ref 6–23)
BUN: 10 mg/dL (ref 6–23)
Calcium: 8.8 mg/dL (ref 8.4–10.5)
Calcium: 9.1 mg/dL (ref 8.4–10.5)
Chloride: 106 mEq/L (ref 96–112)
Creatinine, Ser: 0.69 mg/dL (ref 0.4–1.2)
Creatinine, Ser: 0.71 mg/dL (ref 0.4–1.2)
GFR calc Af Amer: >60 mL/min (ref >60)
Glucose, Bld: 90 mg/dL (ref 70–99)
Glucose, Bld: 137 mg/dL — ABNORMAL HIGH (ref 70–99)
Glucose, Bld: 85 mg/dL (ref 70–99)
Potassium: 3.9 mEq/L (ref 3.5–5.1)
Potassium: 4 mEq/L (ref 3.5–5.1)
Sodium: 139 mEq/L (ref 135–145)
Sodium: 140 mEq/L (ref 135–145)
Total Bilirubin: 0.8 mg/dL (ref 0.3–1.2)
Total Protein: 7 g/dL (ref 6.0–8.3)
Total Protein: 7.1 g/dL (ref 6.0–8.3)
Total Protein: 7.2 g/dL (ref 6.0–8.3)

## 2010-08-02 LAB — POCT CARDIAC MARKERS
CKMB, poc: <1 ng/mL — ABNORMAL LOW (ref 1.0–8.0)
CKMB, poc: 3.3 ng/mL (ref 1.0–8.0)
Myoglobin, poc: 44.6 ng/mL (ref 12–200)
Troponin i, poc: <0.05 ng/mL (ref 0.00–0.09)
Troponin i, poc: <0.05 ng/mL (ref 0.00–0.09)

## 2010-08-02 LAB — CULTURE, BLOOD (ROUTINE X 2)

## 2010-08-02 LAB — RAPID URINE DRUG SCREEN, HOSP PERFORMED
Amphetamines: NOT DETECTED
Benzodiazepines: NOT DETECTED

## 2010-08-02 LAB — LIPASE, BLOOD: Lipase: 32 U/L (ref 11–59)

## 2010-08-02 LAB — URINE MICROSCOPIC-ADD ON

## 2010-08-03 LAB — COMPREHENSIVE METABOLIC PANEL
ALT: 15 U/L (ref 0–35)
AST: 16 U/L (ref 0–37)
Albumin: 3.9 g/dL (ref 3.5–5.2)
Alkaline Phosphatase: 56 U/L (ref 39–117)
CO2: 24 mEq/L (ref 19–32)
Chloride: 109 mEq/L (ref 96–112)
Creatinine, Ser: 0.62 mg/dL (ref 0.4–1.2)
GFR calc Af Amer: >60 mL/min (ref >60)
GFR calc non Af Amer: >60 mL/min (ref >60)
Potassium: 3.6 mEq/L (ref 3.5–5.1)
Total Bilirubin: 0.6 mg/dL (ref 0.3–1.2)

## 2010-08-03 LAB — CARDIAC PANEL(CRET KIN+CKTOT+MB+TROPI)
CK, MB: 0.5 ng/mL (ref 0.3–4.0)
CK, MB: 0.5 ng/mL (ref 0.3–4.0)
Relative Index: INVALID (ref 0.0–2.5)
Troponin I: 0.01 ng/mL (ref 0.00–0.06)

## 2010-08-03 LAB — CK TOTAL AND CKMB (NOT AT ARMC)
CK, MB: 0.8 ng/mL (ref 0.3–4.0)
Relative Index: INVALID (ref 0.0–2.5)
Total CK: 60 U/L (ref 7–177)

## 2010-08-03 LAB — LIPID PANEL
LDL Cholesterol: 57 mg/dL (ref 0–99)
Total CHOL/HDL Ratio: 3 RATIO
Triglycerides: 82 mg/dL (ref <150)
VLDL: 16 mg/dL (ref 0–40)

## 2010-08-03 LAB — CBC
Hemoglobin: 8.8 g/dL — ABNORMAL LOW (ref 12.0–15.0)
MCV: 71.8 fL — ABNORMAL LOW (ref 78.0–100.0)
Platelets: 298 10*3/uL (ref 150–400)
RBC: 3.77 MIL/uL — ABNORMAL LOW (ref 3.87–5.11)
RBC: 4.02 MIL/uL (ref 3.87–5.11)
RDW: 18.8 % — ABNORMAL HIGH (ref 11.5–15.5)
WBC: 5.7 10*3/uL (ref 4.0–10.5)

## 2010-08-03 LAB — TROPONIN I: Troponin I: 0.01 ng/mL (ref 0.00–0.06)

## 2010-08-03 LAB — BASIC METABOLIC PANEL
BUN: 9 mg/dL (ref 6–23)
Calcium: 8.9 mg/dL (ref 8.4–10.5)
Chloride: 110 mEq/L (ref 96–112)
Creatinine, Ser: 0.71 mg/dL (ref 0.4–1.2)
GFR calc Af Amer: >60 mL/min (ref >60)
GFR calc non Af Amer: >60 mL/min (ref >60)

## 2010-08-03 LAB — POCT CARDIAC MARKERS: Myoglobin, poc: 55.3 ng/mL (ref 12–200)

## 2010-08-03 LAB — TSH: TSH: 1.101 u[IU]/mL (ref 0.350–4.500)

## 2010-08-05 LAB — CBC
HCT: 28.4 % — ABNORMAL LOW (ref 36.0–46.0)
Hemoglobin: 9.1 g/dL — ABNORMAL LOW (ref 12.0–15.0)
RBC: 3.94 MIL/uL (ref 3.87–5.11)
RDW: 16.8 % — ABNORMAL HIGH (ref 11.5–15.5)

## 2010-08-05 LAB — COMPREHENSIVE METABOLIC PANEL
Alkaline Phosphatase: 50 U/L (ref 39–117)
BUN: 12 mg/dL (ref 6–23)
CO2: 24 mEq/L (ref 19–32)
GFR calc non Af Amer: >60 mL/min (ref >60)
Glucose, Bld: 119 mg/dL — ABNORMAL HIGH (ref 70–99)
Potassium: 3.2 mEq/L — ABNORMAL LOW (ref 3.5–5.1)
Total Bilirubin: 0.7 mg/dL (ref 0.3–1.2)
Total Protein: 6.2 g/dL (ref 6.0–8.3)

## 2010-08-05 LAB — POCT CARDIAC MARKERS
CKMB, poc: <1 ng/mL — ABNORMAL LOW (ref 1.0–8.0)
Myoglobin, poc: 26.1 ng/mL (ref 12–200)
Myoglobin, poc: 40.7 ng/mL (ref 12–200)

## 2010-08-05 LAB — DIFFERENTIAL
Basophils Absolute: 0 10*3/uL (ref 0.0–0.1)
Basophils Relative: 0 % (ref 0–1)
Eosinophils Absolute: 0.1 10*3/uL (ref 0.0–0.7)
Neutro Abs: 2.4 10*3/uL (ref 1.7–7.7)
Neutrophils Relative %: 49 % (ref 43–77)

## 2010-08-05 LAB — URINALYSIS, ROUTINE W REFLEX MICROSCOPIC
Glucose, UA: NEGATIVE mg/dL
Ketones, ur: NEGATIVE mg/dL
Nitrite: NEGATIVE
Protein, ur: NEGATIVE mg/dL

## 2010-08-05 LAB — RAPID URINE DRUG SCREEN, HOSP PERFORMED
Amphetamines: NOT DETECTED
Benzodiazepines: NOT DETECTED
Tetrahydrocannabinol: NOT DETECTED

## 2010-08-05 LAB — LIPASE, BLOOD: Lipase: 38 U/L (ref 11–59)

## 2010-08-05 LAB — URINE MICROSCOPIC-ADD ON

## 2010-08-06 LAB — DIFFERENTIAL
Basophils Absolute: 0 10*3/uL (ref 0.0–0.1)
Basophils Absolute: 0 10*3/uL (ref 0.0–0.1)
Basophils Absolute: 0 10*3/uL (ref 0.0–0.1)
Basophils Relative: 0 % (ref 0–1)
Basophils Relative: 0 % (ref 0–1)
Basophils Relative: 1 % (ref 0–1)
Eosinophils Absolute: 0 10*3/uL (ref 0.0–0.7)
Eosinophils Absolute: 0 10*3/uL (ref 0.0–0.7)
Eosinophils Absolute: 0.1 10*3/uL (ref 0.0–0.7)
Eosinophils Absolute: 0 10*3/uL (ref 0.0–0.7)
Eosinophils Relative: 1 % (ref 0–5)
Eosinophils Relative: 1 % (ref 0–5)
Eosinophils Relative: 1 % (ref 0–5)
Lymphocytes Relative: 33 % (ref 12–46)
Lymphocytes Relative: 34 % (ref 12–46)
Lymphocytes Relative: 35 % (ref 12–46)
Lymphocytes Relative: 38 % (ref 12–46)
Lymphs Abs: 2 10*3/uL (ref 0.7–4.0)
Lymphs Abs: 2.1 10*3/uL (ref 0.7–4.0)
Lymphs Abs: 2.4 10*3/uL (ref 0.7–4.0)
Lymphs Abs: 1.7 10*3/uL (ref 0.7–4.0)
Monocytes Absolute: 0.4 10*3/uL (ref 0.1–1.0)
Monocytes Absolute: 0.5 10*3/uL (ref 0.1–1.0)
Monocytes Absolute: 0.5 10*3/uL (ref 0.1–1.0)
Monocytes Relative: 6 % (ref 3–12)
Monocytes Relative: 8 % (ref 3–12)
Monocytes Relative: 8 % (ref 3–12)
Neutro Abs: 3.5 10*3/uL (ref 1.7–7.7)
Neutro Abs: 3.7 10*3/uL (ref 1.7–7.7)
Neutro Abs: 3.8 10*3/uL (ref 1.7–7.7)
Neutrophils Relative %: 56 % (ref 43–77)
Neutrophils Relative %: 57 % (ref 43–77)
Neutrophils Relative %: 60 % (ref 43–77)
Neutrophils Relative %: 53 % (ref 43–77)

## 2010-08-06 LAB — POCT CARDIAC MARKERS
CKMB, poc: <1 ng/mL — ABNORMAL LOW (ref 1.0–8.0)
CKMB, poc: <1 ng/mL — ABNORMAL LOW (ref 1.0–8.0)
Myoglobin, poc: 50.8 ng/mL (ref 12–200)
Myoglobin, poc: 48.8 ng/mL (ref 12–200)
Troponin i, poc: <0.05 ng/mL (ref 0.00–0.09)
Troponin i, poc: <0.05 ng/mL (ref 0.00–0.09)
Troponin i, poc: <0.05 ng/mL (ref 0.00–0.09)

## 2010-08-06 LAB — CBC
HCT: 31 % — ABNORMAL LOW (ref 36.0–46.0)
Hemoglobin: 10.1 g/dL — ABNORMAL LOW (ref 12.0–15.0)
Hemoglobin: 10.5 g/dL — ABNORMAL LOW (ref 12.0–15.0)
Hemoglobin: 9.7 g/dL — ABNORMAL LOW (ref 12.0–15.0)
MCHC: 32.5 g/dL (ref 30.0–36.0)
MCV: 71 fL — ABNORMAL LOW (ref 78.0–100.0)
Platelets: 314 10*3/uL (ref 150–400)
Platelets: 339 10*3/uL (ref 150–400)
Platelets: 341 10*3/uL (ref 150–400)
RBC: 4.17 MIL/uL (ref 3.87–5.11)
RBC: 4.36 MIL/uL (ref 3.87–5.11)
RDW: 16.5 % — ABNORMAL HIGH (ref 11.5–15.5)
RDW: 16.8 % — ABNORMAL HIGH (ref 11.5–15.5)
WBC: 6.2 10*3/uL (ref 4.0–10.5)
WBC: 4.5 10*3/uL (ref 4.0–10.5)

## 2010-08-06 LAB — URINALYSIS, ROUTINE W REFLEX MICROSCOPIC
Bilirubin Urine: NEGATIVE
Glucose, UA: NEGATIVE mg/dL
Glucose, UA: NEGATIVE mg/dL
Glucose, UA: NEGATIVE mg/dL
Ketones, ur: 15 mg/dL — AB
Ketones, ur: NEGATIVE mg/dL
Nitrite: NEGATIVE
Nitrite: POSITIVE — AB
Protein, ur: 100 mg/dL — AB
Protein, ur: 30 mg/dL — AB
Protein, ur: 100 mg/dL — AB
Specific Gravity, Urine: 1.009 (ref 1.005–1.030)
Specific Gravity, Urine: 1.022 (ref 1.005–1.030)
Urobilinogen, UA: 0.2 mg/dL (ref 0.0–1.0)
Urobilinogen, UA: 2 mg/dL — ABNORMAL HIGH (ref 0.0–1.0)
Urobilinogen, UA: 1 mg/dL (ref 0.0–1.0)
pH: 6.5 (ref 5.0–8.0)
pH: 8.5 — ABNORMAL HIGH (ref 5.0–8.0)

## 2010-08-06 LAB — BASIC METABOLIC PANEL
BUN: 7 mg/dL (ref 6–23)
BUN: 9 mg/dL (ref 6–23)
CO2: 23 mEq/L (ref 19–32)
Calcium: 9.3 mg/dL (ref 8.4–10.5)
Calcium: 9.6 mg/dL (ref 8.4–10.5)
Chloride: 107 mEq/L (ref 96–112)
Creatinine, Ser: 0.84 mg/dL (ref 0.4–1.2)
Creatinine, Ser: 0.72 mg/dL (ref 0.4–1.2)
GFR calc Af Amer: >60 mL/min (ref >60)
GFR calc non Af Amer: >60 mL/min (ref >60)
GFR calc non Af Amer: >60 mL/min (ref >60)
GFR calc non Af Amer: >60 mL/min (ref >60)
Glucose, Bld: 87 mg/dL (ref 70–99)
Glucose, Bld: 92 mg/dL (ref 70–99)
Potassium: 3.6 mEq/L (ref 3.5–5.1)
Sodium: 138 mEq/L (ref 135–145)
Sodium: 138 mEq/L (ref 135–145)

## 2010-08-06 LAB — URINE CULTURE: Colony Count: >=100000

## 2010-08-06 LAB — URINE MICROSCOPIC-ADD ON

## 2010-08-06 LAB — COMPREHENSIVE METABOLIC PANEL WITH GFR
ALT: 17 U/L (ref 0–35)
AST: 22 U/L (ref 0–37)
Albumin: 3.8 g/dL (ref 3.5–5.2)
Alkaline Phosphatase: 55 U/L (ref 39–117)
BUN: 7 mg/dL (ref 6–23)
CO2: 26 meq/L (ref 19–32)
Calcium: 9.4 mg/dL (ref 8.4–10.5)
Chloride: 106 meq/L (ref 96–112)
Creatinine, Ser: 0.7 mg/dL (ref 0.4–1.2)
GFR calc non Af Amer: >60 mL/min
Glucose, Bld: 88 mg/dL (ref 70–99)
Potassium: 4 meq/L (ref 3.5–5.1)
Sodium: 139 meq/L (ref 135–145)
Total Bilirubin: 1 mg/dL (ref 0.3–1.2)
Total Protein: 7.1 g/dL (ref 6.0–8.3)

## 2010-08-06 LAB — HEPATIC FUNCTION PANEL
Albumin: 4.1 g/dL (ref 3.5–5.2)
Alkaline Phosphatase: 55 U/L (ref 39–117)
Indirect Bilirubin: 1.2 mg/dL — ABNORMAL HIGH (ref 0.3–0.9)
Total Bilirubin: 1.4 mg/dL — ABNORMAL HIGH (ref 0.3–1.2)
Total Protein: 7.2 g/dL (ref 6.0–8.3)

## 2010-08-06 LAB — RAPID URINE DRUG SCREEN, HOSP PERFORMED
Benzodiazepines: POSITIVE — AB
Cocaine: POSITIVE — AB
Tetrahydrocannabinol: NOT DETECTED

## 2010-08-06 LAB — POCT PREGNANCY, URINE: Preg Test, Ur: NEGATIVE

## 2010-08-06 LAB — LIPASE, BLOOD
Lipase: 29 U/L (ref 11–59)
Lipase: 31 U/L (ref 11–59)

## 2010-08-06 LAB — BRAIN NATRIURETIC PEPTIDE: Pro B Natriuretic peptide (BNP): <30 pg/mL (ref 0.0–100.0)

## 2010-08-06 LAB — HEMOCCULT GUIAC POC 1CARD (OFFICE): Fecal Occult Bld: NEGATIVE

## 2010-08-06 LAB — WET PREP, GENITAL: Yeast Wet Prep HPF POC: NONE SEEN

## 2010-08-07 LAB — POCT CARDIAC MARKERS
CKMB, poc: <1 ng/mL — ABNORMAL LOW (ref 1.0–8.0)
Myoglobin, poc: 42.2 ng/mL (ref 12–200)
Myoglobin, poc: 50.7 ng/mL (ref 12–200)
Troponin i, poc: <0.05 ng/mL (ref 0.00–0.09)

## 2010-08-07 LAB — RAPID URINE DRUG SCREEN, HOSP PERFORMED
Amphetamines: NOT DETECTED
Benzodiazepines: POSITIVE — AB
Cocaine: POSITIVE — AB

## 2010-08-07 LAB — CBC
HCT: 32.4 % — ABNORMAL LOW (ref 36.0–46.0)
MCHC: 32 g/dL (ref 30.0–36.0)
Platelets: 353 10*3/uL (ref 150–400)
RDW: 17.4 % — ABNORMAL HIGH (ref 11.5–15.5)

## 2010-08-07 LAB — DIFFERENTIAL
Lymphocytes Relative: 32 % (ref 12–46)
Lymphs Abs: 1.9 10*3/uL (ref 0.7–4.0)
Monocytes Absolute: 0.5 10*3/uL (ref 0.1–1.0)
Monocytes Relative: 8 % (ref 3–12)
Neutro Abs: 3.4 10*3/uL (ref 1.7–7.7)

## 2010-08-07 LAB — COMPREHENSIVE METABOLIC PANEL
Albumin: 3.9 g/dL (ref 3.5–5.2)
Alkaline Phosphatase: 56 U/L (ref 39–117)
BUN: 8 mg/dL (ref 6–23)
Calcium: 9.5 mg/dL (ref 8.4–10.5)
Creatinine, Ser: 0.62 mg/dL (ref 0.4–1.2)
Potassium: 3.9 mEq/L (ref 3.5–5.1)
Total Protein: 7 g/dL (ref 6.0–8.3)

## 2010-08-07 LAB — URINALYSIS, ROUTINE W REFLEX MICROSCOPIC
Glucose, UA: NEGATIVE mg/dL
Hgb urine dipstick: NEGATIVE
Ketones, ur: NEGATIVE mg/dL
Protein, ur: NEGATIVE mg/dL
pH: 7 (ref 5.0–8.0)

## 2010-08-07 LAB — ETHANOL: Alcohol, Ethyl (B): <5 mg/dL (ref 0–10)

## 2010-08-08 LAB — CBC
HCT: 28.7 % — ABNORMAL LOW (ref 36.0–46.0)
HCT: 28.9 % — ABNORMAL LOW (ref 36.0–46.0)
Hemoglobin: 9.3 g/dL — ABNORMAL LOW (ref 12.0–15.0)
Hemoglobin: 9.3 g/dL — ABNORMAL LOW (ref 12.0–15.0)
Hemoglobin: 10.7 g/dL — ABNORMAL LOW (ref 12.0–15.0)
Hemoglobin: 9.3 g/dL — ABNORMAL LOW (ref 12.0–15.0)
Hemoglobin: 9.5 g/dL — ABNORMAL LOW (ref 12.0–15.0)
MCHC: 32 g/dL (ref 30.0–36.0)
MCHC: 32.3 g/dL (ref 30.0–36.0)
MCHC: 32.4 g/dL (ref 30.0–36.0)
MCHC: 32.1 g/dL (ref 30.0–36.0)
MCV: 72 fL — ABNORMAL LOW (ref 78.0–100.0)
MCV: 72.3 fL — ABNORMAL LOW (ref 78.0–100.0)
MCV: 71.7 fL — ABNORMAL LOW (ref 78.0–100.0)
Platelets: 294 10*3/uL (ref 150–400)
Platelets: 298 10*3/uL (ref 150–400)
RBC: 4.28 MIL/uL (ref 3.87–5.11)
RBC: 4.06 MIL/uL (ref 3.87–5.11)
RBC: 4.67 MIL/uL (ref 3.87–5.11)
RDW: 17.6 % — ABNORMAL HIGH (ref 11.5–15.5)
RDW: 17.6 % — ABNORMAL HIGH (ref 11.5–15.5)
RDW: 17.4 % — ABNORMAL HIGH (ref 11.5–15.5)
RDW: 17.5 % — ABNORMAL HIGH (ref 11.5–15.5)
WBC: 6.4 10*3/uL (ref 4.0–10.5)
WBC: 4.6 10*3/uL (ref 4.0–10.5)

## 2010-08-08 LAB — LIPASE, BLOOD
Lipase: 25 U/L (ref 11–59)
Lipase: 30 U/L (ref 11–59)

## 2010-08-08 LAB — DIFFERENTIAL
Basophils Absolute: 0 10*3/uL (ref 0.0–0.1)
Basophils Absolute: 0 10*3/uL (ref 0.0–0.1)
Basophils Relative: 0 % (ref 0–1)
Basophils Relative: 1 % (ref 0–1)
Eosinophils Absolute: 0 10*3/uL (ref 0.0–0.7)
Eosinophils Absolute: 0.1 10*3/uL (ref 0.0–0.7)
Eosinophils Relative: 1 % (ref 0–5)
Lymphocytes Relative: 25 % (ref 12–46)
Lymphocytes Relative: 38 % (ref 12–46)
Lymphocytes Relative: 30 % (ref 12–46)
Lymphs Abs: 1.6 10*3/uL (ref 0.7–4.0)
Lymphs Abs: 1.9 10*3/uL (ref 0.7–4.0)
Monocytes Absolute: 0.4 10*3/uL (ref 0.1–1.0)
Monocytes Absolute: 0.4 10*3/uL (ref 0.1–1.0)
Monocytes Absolute: 0.4 10*3/uL (ref 0.1–1.0)
Monocytes Absolute: 0.4 10*3/uL (ref 0.1–1.0)
Monocytes Relative: 6 % (ref 3–12)
Monocytes Relative: 7 % (ref 3–12)
Neutro Abs: 2.9 10*3/uL (ref 1.7–7.7)
Neutro Abs: 3 10*3/uL (ref 1.7–7.7)
Neutro Abs: 3.9 10*3/uL (ref 1.7–7.7)
Neutrophils Relative %: 54 % (ref 43–77)
Neutrophils Relative %: 54 % (ref 43–77)
Neutrophils Relative %: 62 % (ref 43–77)

## 2010-08-08 LAB — URINALYSIS, ROUTINE W REFLEX MICROSCOPIC
Bilirubin Urine: NEGATIVE
Bilirubin Urine: NEGATIVE
Glucose, UA: NEGATIVE mg/dL
Glucose, UA: NEGATIVE mg/dL
Ketones, ur: NEGATIVE mg/dL
Ketones, ur: NEGATIVE mg/dL
Nitrite: NEGATIVE
Protein, ur: 100 mg/dL — AB
Specific Gravity, Urine: 1.019 (ref 1.005–1.030)
Urobilinogen, UA: 1 mg/dL (ref 0.0–1.0)
pH: 6.5 (ref 5.0–8.0)
pH: 6 (ref 5.0–8.0)

## 2010-08-08 LAB — URINE CULTURE

## 2010-08-08 LAB — URINE MICROSCOPIC-ADD ON

## 2010-08-08 LAB — COMPREHENSIVE METABOLIC PANEL
ALT: 14 U/L (ref 0–35)
ALT: 15 U/L (ref 0–35)
AST: 16 U/L (ref 0–37)
AST: 15 U/L (ref 0–37)
Albumin: 3.4 g/dL — ABNORMAL LOW (ref 3.5–5.2)
Albumin: 3.5 g/dL (ref 3.5–5.2)
Albumin: 3.9 g/dL (ref 3.5–5.2)
Alkaline Phosphatase: 55 U/L (ref 39–117)
Alkaline Phosphatase: 52 U/L (ref 39–117)
BUN: 9 mg/dL (ref 6–23)
BUN: 11 mg/dL (ref 6–23)
CO2: 25 mEq/L (ref 19–32)
CO2: 26 mEq/L (ref 19–32)
CO2: 24 mEq/L (ref 19–32)
Calcium: 9.2 mg/dL (ref 8.4–10.5)
Calcium: 9.3 mg/dL (ref 8.4–10.5)
Chloride: 109 mEq/L (ref 96–112)
Chloride: 109 mEq/L (ref 96–112)
Chloride: 111 mEq/L (ref 96–112)
Creatinine, Ser: 0.66 mg/dL (ref 0.4–1.2)
Creatinine, Ser: 0.66 mg/dL (ref 0.4–1.2)
GFR calc Af Amer: >60 mL/min (ref >60)
GFR calc Af Amer: >60 mL/min (ref >60)
GFR calc non Af Amer: >60 mL/min (ref >60)
GFR calc non Af Amer: >60 mL/min (ref >60)
GFR calc non Af Amer: >60 mL/min (ref >60)
Glucose, Bld: 102 mg/dL — ABNORMAL HIGH (ref 70–99)
Glucose, Bld: 83 mg/dL (ref 70–99)
Potassium: 3.4 mEq/L — ABNORMAL LOW (ref 3.5–5.1)
Potassium: 3.6 mEq/L (ref 3.5–5.1)
Sodium: 139 mEq/L (ref 135–145)
Sodium: 142 mEq/L (ref 135–145)
Total Bilirubin: 0.6 mg/dL (ref 0.3–1.2)
Total Bilirubin: 0.5 mg/dL (ref 0.3–1.2)
Total Protein: 7.3 g/dL (ref 6.0–8.3)

## 2010-08-08 LAB — APTT: aPTT: 42 seconds — ABNORMAL HIGH (ref 24–37)

## 2010-08-08 LAB — TSH: TSH: 0.698 u[IU]/mL (ref 0.350–4.500)

## 2010-08-08 LAB — RETICULOCYTES
RBC.: 4.6 MIL/uL (ref 3.87–5.11)
Retic Count, Absolute: 112.6 10*3/uL (ref 19.0–186.0)
Retic Count, Absolute: 92.4 10*3/uL (ref 19.0–186.0)
Retic Count, Absolute: 73.6 10*3/uL (ref 19.0–186.0)
Retic Ct Pct: 1.6 % (ref 0.4–3.1)

## 2010-08-08 LAB — PROTIME-INR
INR: 1.1 (ref 0.00–1.49)
Prothrombin Time: 14.1 seconds (ref 11.6–15.2)

## 2010-08-08 LAB — CK TOTAL AND CKMB (NOT AT ARMC)
CK, MB: 0.7 ng/mL (ref 0.3–4.0)
CK, MB: 0.5 ng/mL (ref 0.3–4.0)
Relative Index: INVALID (ref 0.0–2.5)
Total CK: 47 U/L (ref 7–177)

## 2010-08-08 LAB — CARDIAC PANEL(CRET KIN+CKTOT+MB+TROPI)
CK, MB: 0.5 ng/mL (ref 0.3–4.0)
Relative Index: INVALID (ref 0.0–2.5)
Relative Index: INVALID (ref 0.0–2.5)
Total CK: 38 U/L (ref 7–177)
Troponin I: 0.01 ng/mL (ref 0.00–0.06)
Troponin I: 0.02 ng/mL (ref 0.00–0.06)

## 2010-08-08 LAB — SAVE SMEAR

## 2010-08-08 LAB — BASIC METABOLIC PANEL
BUN: 8 mg/dL (ref 6–23)
Chloride: 107 mEq/L (ref 96–112)
Chloride: 109 mEq/L (ref 96–112)
GFR calc non Af Amer: >60 mL/min (ref >60)
GFR calc non Af Amer: >60 mL/min (ref >60)
Glucose, Bld: 100 mg/dL — ABNORMAL HIGH (ref 70–99)
Potassium: 3.9 mEq/L (ref 3.5–5.1)
Potassium: 4.1 mEq/L (ref 3.5–5.1)
Sodium: 139 mEq/L (ref 135–145)
Sodium: 139 mEq/L (ref 135–145)

## 2010-08-08 LAB — RAPID URINE DRUG SCREEN, HOSP PERFORMED
Barbiturates: NOT DETECTED
Benzodiazepines: POSITIVE — AB

## 2010-08-08 LAB — GLUCOSE, CAPILLARY: Glucose-Capillary: 99 mg/dL (ref 70–99)

## 2010-08-08 LAB — POCT CARDIAC MARKERS

## 2010-08-08 LAB — BRAIN NATRIURETIC PEPTIDE: Pro B Natriuretic peptide (BNP): <30 pg/mL (ref 0.0–100.0)

## 2010-08-08 LAB — PREGNANCY, URINE: Preg Test, Ur: NEGATIVE

## 2010-08-08 LAB — TROPONIN I: Troponin I: 0.01 ng/mL (ref 0.00–0.06)

## 2010-08-08 LAB — H. PYLORI ANTIBODY, IGG: H Pylori IgG: 3.8 {ISR} — ABNORMAL HIGH

## 2010-08-09 LAB — COMPREHENSIVE METABOLIC PANEL
ALT: 12 U/L (ref 0–35)
ALT: 13 U/L (ref 0–35)
ALT: 15 U/L (ref 0–35)
AST: 16 U/L (ref 0–37)
AST: 17 U/L (ref 0–37)
CO2: 25 mEq/L (ref 19–32)
Calcium: 9.4 mg/dL (ref 8.4–10.5)
Calcium: 9.5 mg/dL (ref 8.4–10.5)
Chloride: 107 mEq/L (ref 96–112)
GFR calc Af Amer: >60 mL/min (ref >60)
GFR calc Af Amer: >60 mL/min (ref >60)
GFR calc Af Amer: >60 mL/min (ref >60)
GFR calc non Af Amer: >60 mL/min (ref >60)
Glucose, Bld: 83 mg/dL (ref 70–99)
Glucose, Bld: 91 mg/dL (ref 70–99)
Sodium: 138 mEq/L (ref 135–145)
Sodium: 141 mEq/L (ref 135–145)
Sodium: 139 mEq/L (ref 135–145)
Total Bilirubin: 1.1 mg/dL (ref 0.3–1.2)
Total Protein: 6.8 g/dL (ref 6.0–8.3)
Total Protein: 7 g/dL (ref 6.0–8.3)

## 2010-08-09 LAB — RAPID URINE DRUG SCREEN, HOSP PERFORMED: Tetrahydrocannabinol: NOT DETECTED

## 2010-08-09 LAB — URINALYSIS, ROUTINE W REFLEX MICROSCOPIC
Bilirubin Urine: NEGATIVE
Glucose, UA: NEGATIVE mg/dL
Glucose, UA: NEGATIVE mg/dL
Ketones, ur: 40 mg/dL — AB
Leukocytes, UA: NEGATIVE
pH: 6 (ref 5.0–8.0)
pH: 6.5 (ref 5.0–8.0)

## 2010-08-09 LAB — CBC
Hemoglobin: 10.6 g/dL — ABNORMAL LOW (ref 12.0–15.0)
MCHC: 32.2 g/dL (ref 30.0–36.0)
MCHC: 32.5 g/dL (ref 30.0–36.0)
MCV: 72.6 fL — ABNORMAL LOW (ref 78.0–100.0)
Platelets: 294 10*3/uL (ref 150–400)
RBC: 4.47 MIL/uL (ref 3.87–5.11)
RDW: 17.3 % — ABNORMAL HIGH (ref 11.5–15.5)
RDW: 17.4 % — ABNORMAL HIGH (ref 11.5–15.5)
WBC: 6 10*3/uL (ref 4.0–10.5)

## 2010-08-09 LAB — DIFFERENTIAL
Basophils Absolute: 0 10*3/uL (ref 0.0–0.1)
Eosinophils Absolute: 0.1 10*3/uL (ref 0.0–0.7)
Eosinophils Absolute: 0.1 10*3/uL (ref 0.0–0.7)
Eosinophils Absolute: 0 10*3/uL (ref 0.0–0.7)
Eosinophils Relative: 1 % (ref 0–5)
Lymphocytes Relative: 35 % (ref 12–46)
Lymphs Abs: 1.8 10*3/uL (ref 0.7–4.0)
Lymphs Abs: 2 10*3/uL (ref 0.7–4.0)
Monocytes Relative: 7 % (ref 3–12)
Monocytes Relative: 8 % (ref 3–12)
Neutrophils Relative %: 57 % (ref 43–77)
Neutrophils Relative %: 61 % (ref 43–77)

## 2010-08-09 LAB — WOUND CULTURE

## 2010-08-09 LAB — URINE CULTURE: Colony Count: >=100000

## 2010-08-09 LAB — CK TOTAL AND CKMB (NOT AT ARMC)
CK, MB: 0.7 ng/mL (ref 0.3–4.0)
Relative Index: INVALID (ref 0.0–2.5)
Total CK: 56 U/L (ref 7–177)

## 2010-08-09 LAB — LACTIC ACID, PLASMA: Lactic Acid, Venous: 0.7 mmol/L (ref 0.5–2.2)

## 2010-08-09 LAB — POCT CARDIAC MARKERS: Myoglobin, poc: 43.4 ng/mL (ref 12–200)

## 2010-08-09 LAB — HEMOGLOBINOPATHY EVALUATION
Hemoglobin Other: 0 % (ref 0.0–0.0)
Hgb A: 92.7 % — ABNORMAL LOW (ref 96.8–97.8)

## 2010-08-09 LAB — URINE MICROSCOPIC-ADD ON

## 2010-08-09 LAB — RETICULOCYTES: RBC.: 4.56 MIL/uL (ref 3.87–5.11)

## 2010-08-09 LAB — LIPASE, BLOOD: Lipase: 35 U/L (ref 11–59)

## 2010-08-10 LAB — COMPREHENSIVE METABOLIC PANEL
ALT: 13 U/L (ref 0–35)
AST: 15 U/L (ref 0–37)
Alkaline Phosphatase: 51 U/L (ref 39–117)
CO2: 25 mEq/L (ref 19–32)
Chloride: 107 mEq/L (ref 96–112)
Creatinine, Ser: 0.64 mg/dL (ref 0.4–1.2)
GFR calc Af Amer: >60 mL/min (ref >60)
GFR calc non Af Amer: >60 mL/min (ref >60)
Potassium: 3.4 mEq/L — ABNORMAL LOW (ref 3.5–5.1)
Sodium: 140 mEq/L (ref 135–145)
Total Bilirubin: 0.5 mg/dL (ref 0.3–1.2)

## 2010-08-10 LAB — DIFFERENTIAL
Basophils Absolute: 0 10*3/uL (ref 0.0–0.1)
Basophils Relative: 0 % (ref 0–1)
Eosinophils Absolute: 0 10*3/uL (ref 0.0–0.7)
Eosinophils Relative: 1 % (ref 0–5)

## 2010-08-10 LAB — CBC
HCT: 31.2 % — ABNORMAL LOW (ref 36.0–46.0)
MCV: 72.6 fL — ABNORMAL LOW (ref 78.0–100.0)
Platelets: 316 10*3/uL (ref 150–400)
WBC: 6 10*3/uL (ref 4.0–10.5)

## 2010-08-10 LAB — RETICULOCYTES: Retic Ct Pct: 1.6 % (ref 0.4–3.1)

## 2010-08-13 IMAGING — CR DG CHEST 2V
2 series · 2 of 2 positions shown · non-contrast
Comparison: 03/17/19 [DATE]

CLINICAL DATA: Chest pain.Chronic pancreatitis.  Nephrolithiasis.

CHEST - 2 VIEW

[w chest pa]
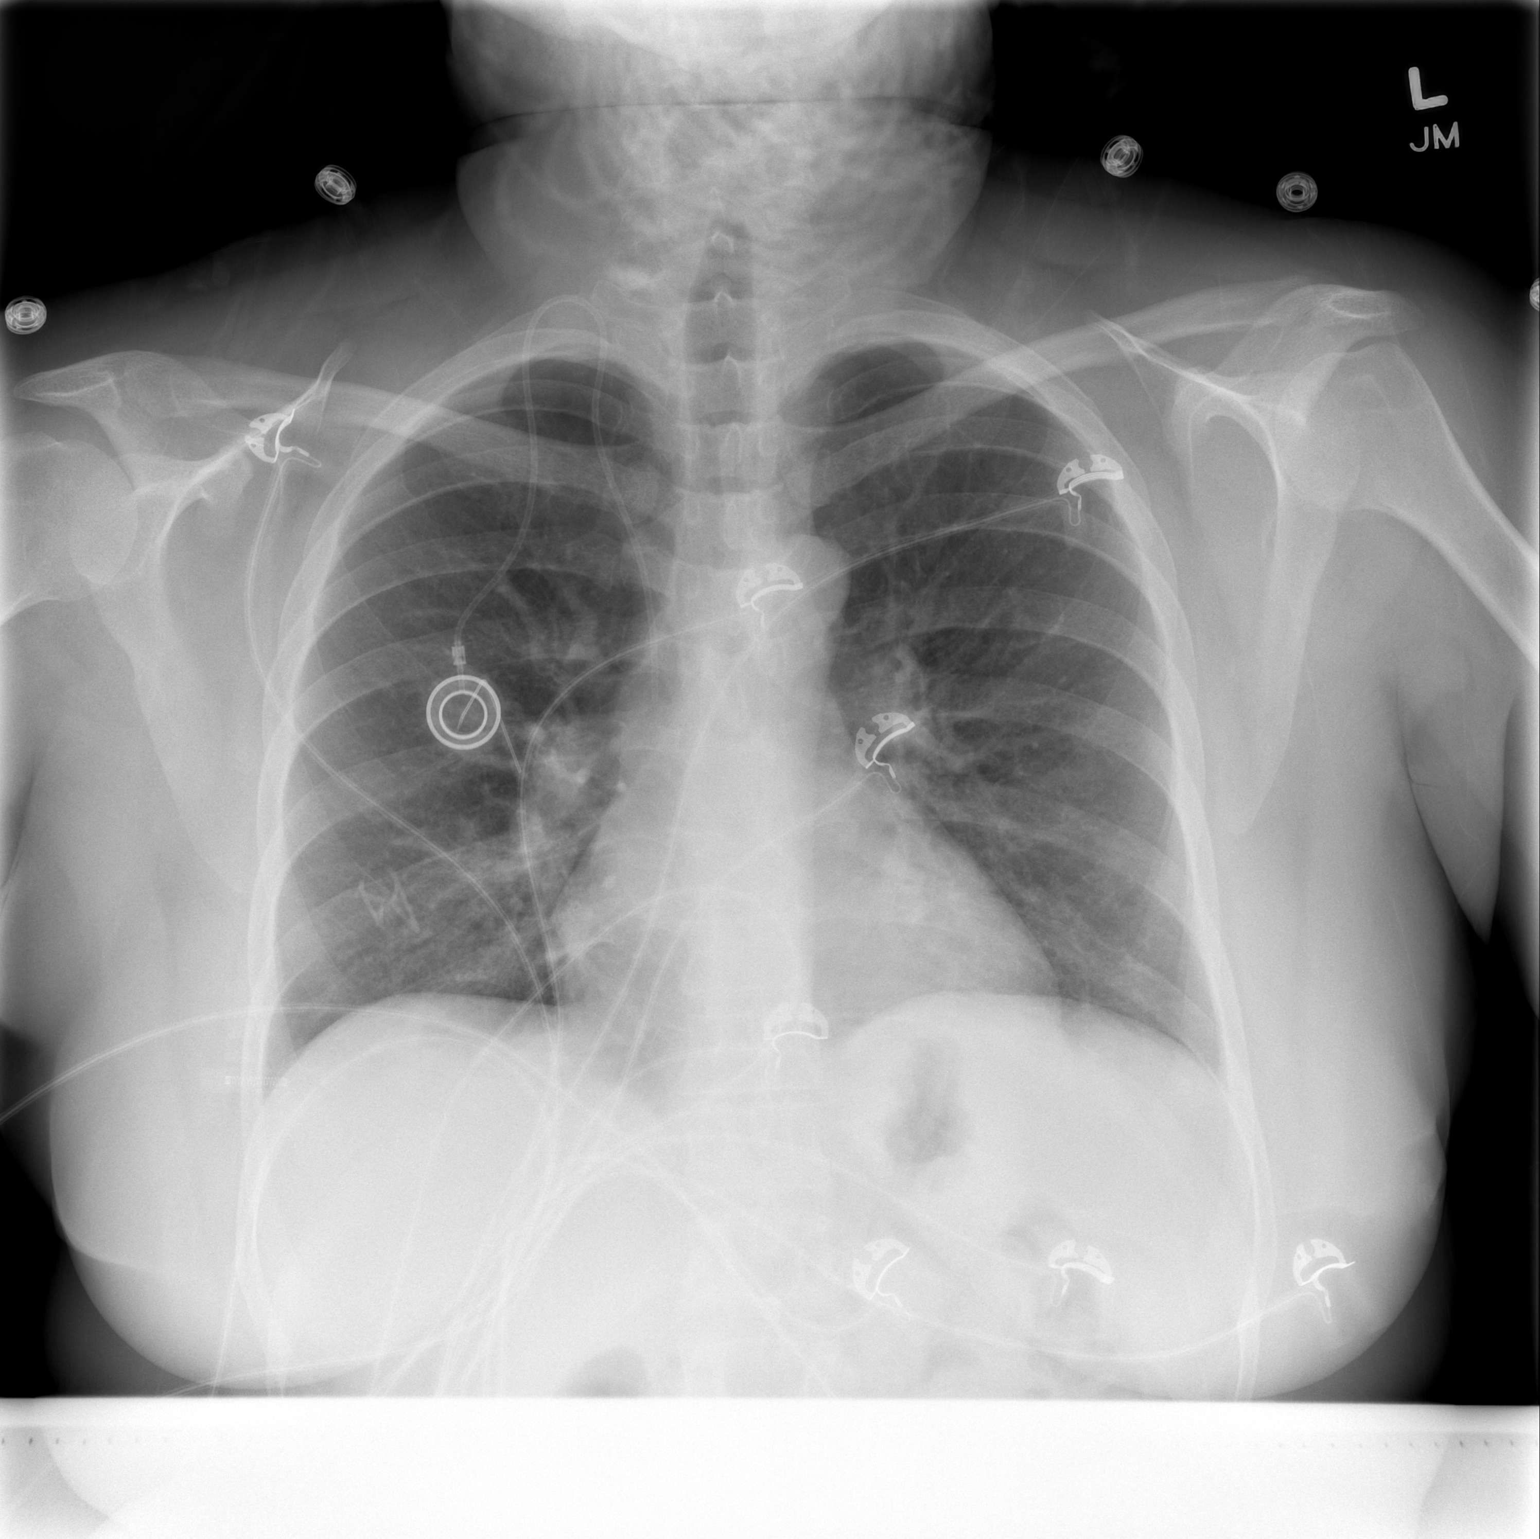

[w chest lat]
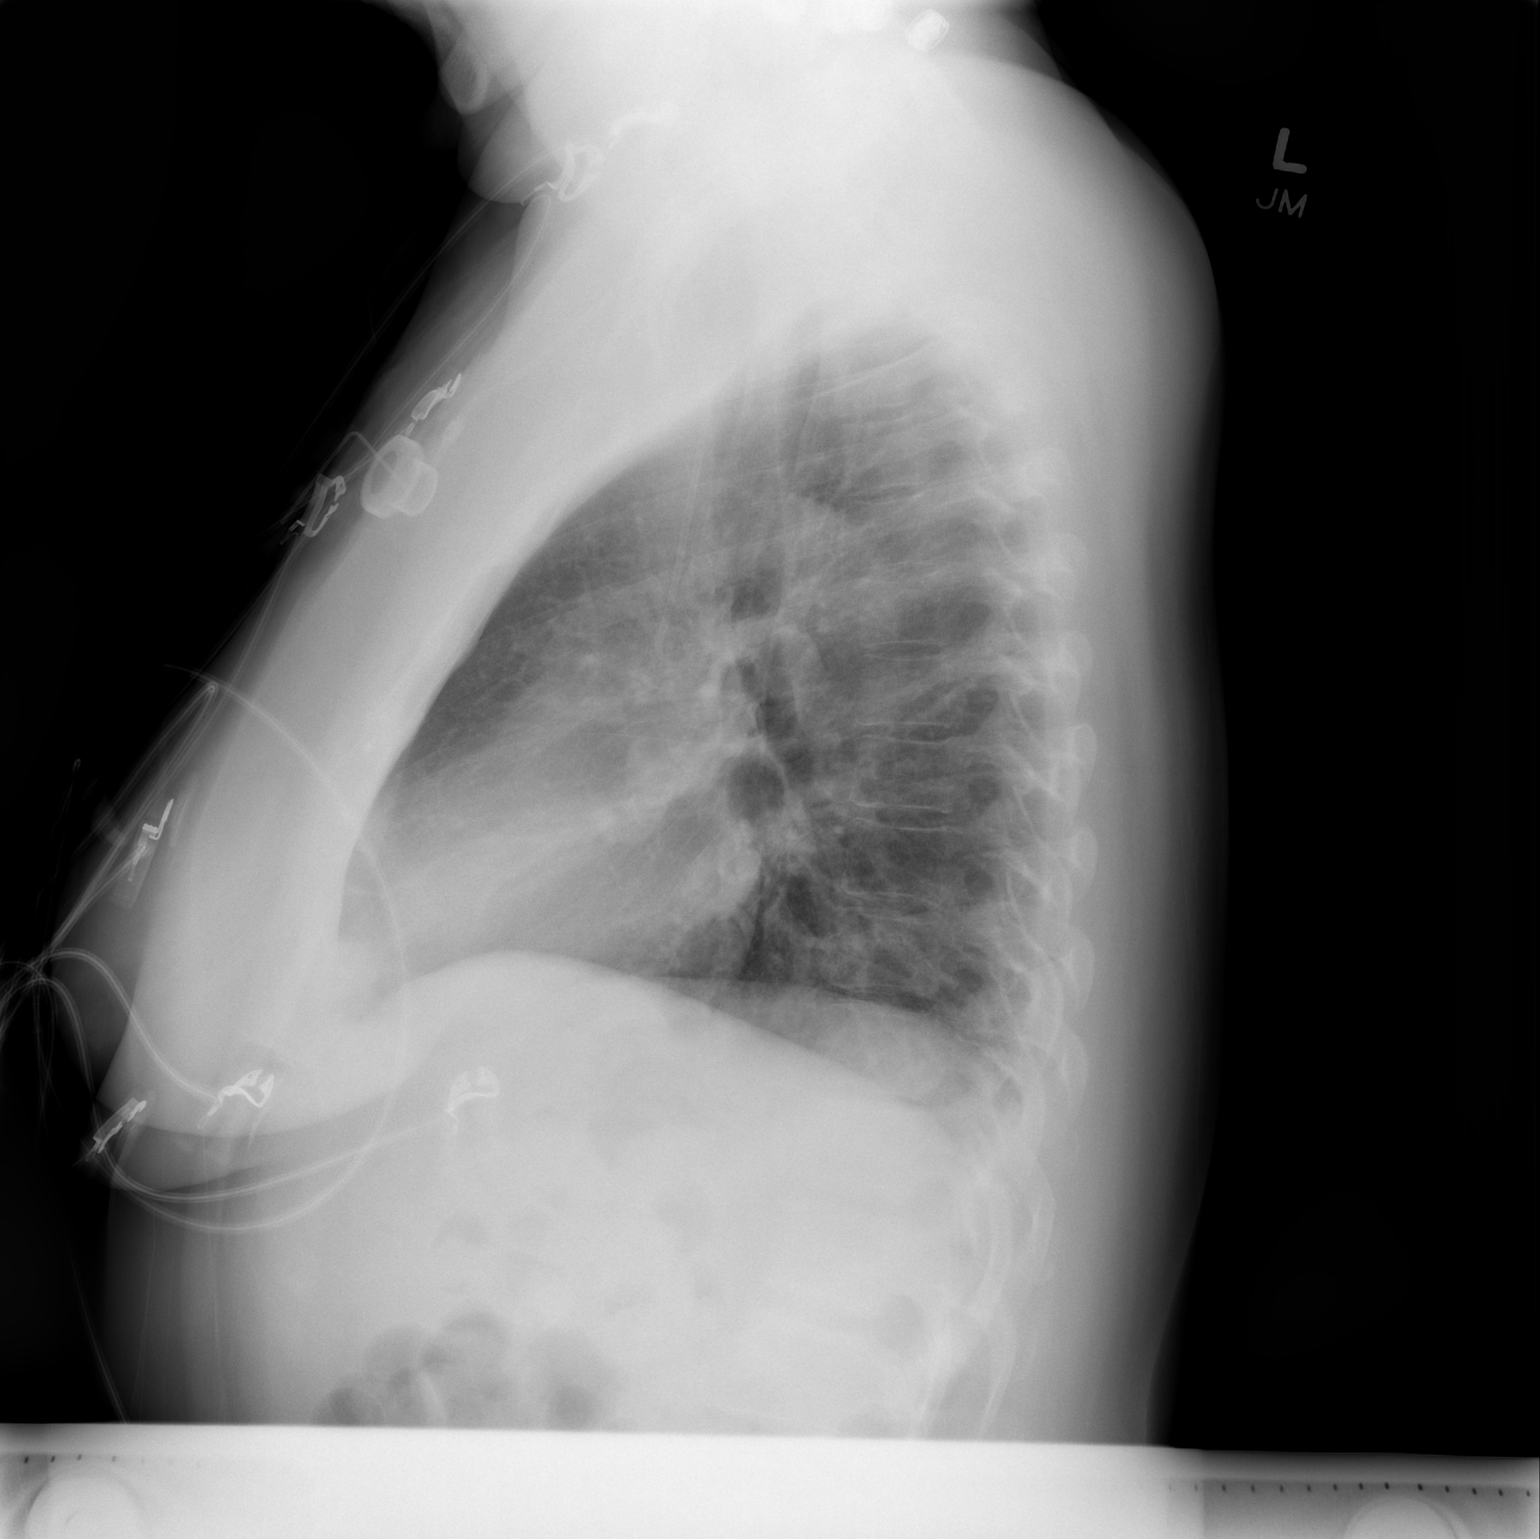

[2 of 2 positions shown; findings below may reference images not displayed]

FINDINGS: Heart size is normal.  Both lungs are clear.  There is no
evidence of pleural effusion.  Right-sided Port-A-Cath remains in
appropriate position.
IMPRESSION: Stable exam.  No active cardiopulmonary disease.

## 2010-08-14 LAB — COMPREHENSIVE METABOLIC PANEL
ALT: 17 U/L (ref 0–35)
Albumin: 3.3 g/dL — ABNORMAL LOW (ref 3.5–5.2)
Alkaline Phosphatase: 54 U/L (ref 39–117)
CO2: 23 mEq/L (ref 19–32)
Calcium: 8.6 mg/dL (ref 8.4–10.5)
Calcium: 9.2 mg/dL (ref 8.4–10.5)
Creatinine, Ser: 0.72 mg/dL (ref 0.4–1.2)
GFR calc Af Amer: >60 mL/min (ref >60)
GFR calc non Af Amer: >60 mL/min (ref >60)
Glucose, Bld: 100 mg/dL — ABNORMAL HIGH (ref 70–99)
Potassium: 2.8 mEq/L — ABNORMAL LOW (ref 3.5–5.1)
Sodium: 136 mEq/L (ref 135–145)
Total Protein: 6.3 g/dL (ref 6.0–8.3)

## 2010-08-14 LAB — BASIC METABOLIC PANEL
BUN: 11 mg/dL (ref 6–23)
CO2: 23 mEq/L (ref 19–32)
Calcium: 8.9 mg/dL (ref 8.4–10.5)
Calcium: 9.1 mg/dL (ref 8.4–10.5)
Chloride: 108 mEq/L (ref 96–112)
Creatinine, Ser: 0.72 mg/dL (ref 0.4–1.2)
Creatinine, Ser: 0.75 mg/dL (ref 0.4–1.2)
GFR calc Af Amer: >60 mL/min (ref >60)
GFR calc non Af Amer: >60 mL/min (ref >60)

## 2010-08-14 LAB — POCT CARDIAC MARKERS
CKMB, poc: <1 ng/mL — ABNORMAL LOW (ref 1.0–8.0)
Myoglobin, poc: 39.6 ng/mL (ref 12–200)

## 2010-08-14 LAB — URINE CULTURE

## 2010-08-14 LAB — DIFFERENTIAL
Lymphocytes Relative: 30 % (ref 12–46)
Lymphs Abs: 2.6 10*3/uL (ref 0.7–4.0)
Neutro Abs: 5.5 10*3/uL (ref 1.7–7.7)
Neutrophils Relative %: 63 % (ref 43–77)

## 2010-08-14 LAB — CARDIAC PANEL(CRET KIN+CKTOT+MB+TROPI)
CK, MB: 0.6 ng/mL (ref 0.3–4.0)
CK, MB: 0.6 ng/mL (ref 0.3–4.0)
Relative Index: INVALID (ref 0.0–2.5)
Relative Index: INVALID (ref 0.0–2.5)
Relative Index: INVALID (ref 0.0–2.5)
Total CK: 47 U/L (ref 7–177)
Total CK: 50 U/L (ref 7–177)
Troponin I: <0.01 ng/mL (ref 0.00–0.06)

## 2010-08-14 LAB — URINALYSIS, ROUTINE W REFLEX MICROSCOPIC
Nitrite: POSITIVE — AB
Specific Gravity, Urine: >1.03 — ABNORMAL HIGH (ref 1.005–1.030)
Urobilinogen, UA: 2 mg/dL — ABNORMAL HIGH (ref 0.0–1.0)

## 2010-08-14 LAB — TSH: TSH: 0.853 u[IU]/mL (ref 0.350–4.500)

## 2010-08-14 LAB — LIPASE, BLOOD: Lipase: 33 U/L (ref 11–59)

## 2010-08-14 LAB — D-DIMER, QUANTITATIVE: D-Dimer, Quant: 0.22 ug/mL-FEU (ref 0.00–0.48)

## 2010-08-14 LAB — PREGNANCY, URINE: Preg Test, Ur: NEGATIVE

## 2010-08-14 LAB — URINE MICROSCOPIC-ADD ON

## 2010-08-14 LAB — CBC
MCHC: 31.5 g/dL (ref 30.0–36.0)
MCV: 71.6 fL — ABNORMAL LOW (ref 78.0–100.0)
RBC: 4.49 MIL/uL (ref 3.87–5.11)

## 2010-08-14 LAB — HEMOGLOBIN A1C: Mean Plasma Glucose: 105 mg/dL

## 2010-08-14 LAB — LIPID PANEL: VLDL: 31 mg/dL (ref 0–40)

## 2010-08-15 LAB — CK TOTAL AND CKMB (NOT AT ARMC)
CK, MB: 0.6 ng/mL (ref 0.3–4.0)
CK, MB: 0.7 ng/mL (ref 0.3–4.0)
Total CK: 78 U/L (ref 7–177)
Total CK: 79 U/L (ref 7–177)

## 2010-08-15 LAB — BASIC METABOLIC PANEL
BUN: 5 mg/dL — ABNORMAL LOW (ref 6–23)
Chloride: 109 mEq/L (ref 96–112)
Creatinine, Ser: 0.83 mg/dL (ref 0.4–1.2)
GFR calc Af Amer: >60 mL/min (ref >60)
GFR calc Af Amer: >60 mL/min (ref >60)
GFR calc non Af Amer: >60 mL/min (ref >60)
Potassium: 3.1 mEq/L — ABNORMAL LOW (ref 3.5–5.1)
Potassium: 3.6 mEq/L (ref 3.5–5.1)

## 2010-08-15 LAB — CBC
HCT: 30.4 % — ABNORMAL LOW (ref 36.0–46.0)
HCT: 26.8 % — ABNORMAL LOW (ref 36.0–46.0)
HCT: 27.3 % — ABNORMAL LOW (ref 36.0–46.0)
HCT: 34.8 % — ABNORMAL LOW (ref 36.0–46.0)
Hemoglobin: 9.9 g/dL — ABNORMAL LOW (ref 12.0–15.0)
Hemoglobin: 11.2 g/dL — ABNORMAL LOW (ref 12.0–15.0)
Hemoglobin: 9 g/dL — ABNORMAL LOW (ref 12.0–15.0)
MCHC: 32.4 g/dL (ref 30.0–36.0)
MCHC: 32.1 g/dL (ref 30.0–36.0)
MCV: 71.4 fL — ABNORMAL LOW (ref 78.0–100.0)
MCV: 72.8 fL — ABNORMAL LOW (ref 78.0–100.0)
MCV: 73.6 fL — ABNORMAL LOW (ref 78.0–100.0)
Platelets: 268 K/uL (ref 150–400)
Platelets: 248 10*3/uL (ref 150–400)
Platelets: 338 K/uL (ref 150–400)
RBC: 4.26 MIL/uL (ref 3.87–5.11)
RBC: 3.65 MIL/uL — ABNORMAL LOW (ref 3.87–5.11)
RBC: 3.76 MIL/uL — ABNORMAL LOW (ref 3.87–5.11)
RBC: 4.73 MIL/uL (ref 3.87–5.11)
RDW: 17.3 % — ABNORMAL HIGH (ref 11.5–15.5)
RDW: 17.7 % — ABNORMAL HIGH (ref 11.5–15.5)
WBC: 5.5 K/uL (ref 4.0–10.5)
WBC: 4.7 10*3/uL (ref 4.0–10.5)
WBC: 5 10*3/uL (ref 4.0–10.5)
WBC: 7.4 K/uL (ref 4.0–10.5)

## 2010-08-15 LAB — DIFFERENTIAL
Basophils Absolute: 0 10*3/uL (ref 0.0–0.1)
Eosinophils Absolute: 0.1 10*3/uL (ref 0.0–0.7)
Eosinophils Relative: 1 % (ref 0–5)
Lymphocytes Relative: 50 % — ABNORMAL HIGH (ref 12–46)
Lymphocytes Relative: 37 % (ref 12–46)
Lymphs Abs: 2.7 10*3/uL (ref 0.7–4.0)
Monocytes Absolute: 0.4 10*3/uL (ref 0.1–1.0)
Monocytes Relative: 7 % (ref 3–12)
Monocytes Relative: 6 % (ref 3–12)
Neutro Abs: 2.4 10*3/uL (ref 1.7–7.7)
Neutro Abs: 4.2 10*3/uL (ref 1.7–7.7)
Neutrophils Relative %: 42 % — ABNORMAL LOW (ref 43–77)

## 2010-08-15 LAB — COMPREHENSIVE METABOLIC PANEL
ALT: 18 U/L (ref 0–35)
AST: 19 U/L (ref 0–37)
Albumin: 3.7 g/dL (ref 3.5–5.2)
Alkaline Phosphatase: 59 U/L (ref 39–117)
BUN: 12 mg/dL (ref 6–23)
Calcium: 8.9 mg/dL (ref 8.4–10.5)
Chloride: 106 mEq/L (ref 96–112)
Creatinine, Ser: 0.74 mg/dL (ref 0.4–1.2)
Creatinine, Ser: 1.05 mg/dL (ref 0.4–1.2)
GFR calc Af Amer: >60 mL/min (ref >60)
GFR calc non Af Amer: >60 mL/min (ref >60)
Glucose, Bld: 133 mg/dL — ABNORMAL HIGH (ref 70–99)
Potassium: 3.6 mEq/L (ref 3.5–5.1)
Sodium: 139 mEq/L (ref 135–145)
Total Bilirubin: 1 mg/dL (ref 0.3–1.2)
Total Protein: 6.4 g/dL (ref 6.0–8.3)
Total Protein: 7 g/dL (ref 6.0–8.3)

## 2010-08-15 LAB — POCT CARDIAC MARKERS
CKMB, poc: <1 ng/mL — ABNORMAL LOW (ref 1.0–8.0)
CKMB, poc: <1 ng/mL — ABNORMAL LOW (ref 1.0–8.0)
Myoglobin, poc: 34.5 ng/mL (ref 12–200)
Myoglobin, poc: 41.6 ng/mL (ref 12–200)
Troponin i, poc: <0.05 ng/mL (ref 0.00–0.09)
Troponin i, poc: <0.05 ng/mL (ref 0.00–0.09)

## 2010-08-15 LAB — RETICULOCYTES
RBC.: 4.26 MIL/uL (ref 3.87–5.11)
Retic Count, Absolute: 80.9 K/uL (ref 19.0–186.0)
Retic Count, Absolute: 85.2 10*3/uL (ref 19.0–186.0)
Retic Ct Pct: 1.9 % (ref 0.4–3.1)

## 2010-08-15 LAB — PROTIME-INR
INR: 1.1 (ref 0.00–1.49)
Prothrombin Time: 14 seconds (ref 11.6–15.2)

## 2010-08-15 LAB — BRAIN NATRIURETIC PEPTIDE
Pro B Natriuretic peptide (BNP): <30 pg/mL (ref 0.0–100.0)
Pro B Natriuretic peptide (BNP): <30 pg/mL (ref 0.0–100.0)

## 2010-08-15 LAB — IRON AND TIBC
Saturation Ratios: 5 % — ABNORMAL LOW (ref 20–55)
TIBC: 318 ug/dL (ref 250–470)

## 2010-08-15 LAB — LIPASE, BLOOD: Lipase: 32 U/L (ref 11–59)

## 2010-08-15 LAB — CARDIAC PANEL(CRET KIN+CKTOT+MB+TROPI)
CK, MB: 0.8 ng/mL (ref 0.3–4.0)
CK, MB: 0.8 ng/mL (ref 0.3–4.0)
Total CK: 94 U/L (ref 7–177)

## 2010-08-15 LAB — TROPONIN I: Troponin I: <0.01 ng/mL (ref 0.00–0.06)

## 2010-08-15 LAB — VITAMIN B12: Vitamin B-12: 332 pg/mL (ref 211–911)

## 2010-08-15 LAB — D-DIMER, QUANTITATIVE: D-Dimer, Quant: <0.22 ug/mL-FEU (ref 0.00–0.48)

## 2010-09-12 NOTE — Discharge Summary (Signed)
NAMEMALEKA, CONTINO              ACCOUNT NO.:  [id number]   MEDICAL RECORD NO.:  [id number]          PATIENT TYPE:  INP   LOCATION:  3032                         FACILITY:  [facility]   PHYSICIAN:  Hind I Elsaid, MD      DATE OF BIRTH:  1965/05/19   DATE OF ADMISSION:  08/25/2008  DATE OF DISCHARGE:  08/26/2008                               DISCHARGE SUMMARY   DISCHARGE DIAGNOSES:  1. Diffuse pain include chest, arm, leg, and abdomen with negative      evaluation.  2. History of beta thalassemia trait, does not have sickle disease, no      evidence hemolysis with normal reticulocytes, and normal      hemoglobin.  3. Possibility of somatization disorder.  4. Bipolar disorder with major depression and anxiety.  5. History of gastroparesis.  6. Status post hysterectomy.   DISCHARGE MEDICATIONS:  The patient signed AMA.  The patient will  continue her home medication.   CONSULTATIONS:  None.   HISTORY OF PRESENT ILLNESS:  Please review the history done by Dr.  Myra Gianotti.  This is a 45 year old female history of presume sickle cell  and beta thalassemia, frequent visit to the emergency room for just pain  and diffuse joint pain, history of coronary artery disease, status post  PCI.  She had cardiac cath in March 2010, that showed normal coronaries  with no evidence of stent and ejection fraction of 60%.  She also has a  history of documented drug-seeking behavior, history of polysubstance  abuse, who presents with diffuse pain.  The patient noted to have joint  and knees, abdominal pain, chest pain, and admitted.  This is symptoms  of her sickle cell crisis.  The patient admitted for evaluation of her  symptoms.  For above symptoms, I did not see the patient has any sickle  cell crises.  The patient's hemoglobin found to be 10.6 with the  hematocrit of 32.7, and MCV of 71.8, platelets 314.  Lipase was normal.  BUN and creatinine within normal.  Reticulocyte count found to be  absolute  80.1.  The patient continued to asking for more and more pain  medication.  I felt the patient has more picture of drug-seeking  behavior.  At that time, I did not feel the patient would benefit from  increasing her pain medication, and the plan was to consult Dr.  Jeanie Sewer to evaluate her somatization, depression, and bipolar  disorder.  The same day, the patient decided to sign AMA.  Myself  notified.  The patient was awake, alert, and oriented x3.  The patient  signed against medical advice.      Hind Bosie Helper, MD  Electronically Signed     Hind Bosie Helper, MD  Electronically Signed    HIE/MEDQ  D:  10/27/2008  T:  10/28/2008  Job:  [id number]

## 2010-09-12 NOTE — Discharge Summary (Signed)
[name], CITRON              ACCOUNT NO.:  [id number]   MEDICAL RECORD NO.:  [id number]          PATIENT TYPE:  IPS   LOCATION:  0500                          [facility]ILITY[facility]  [facility]   PHYSICIAN:  Geoffery Lyons, M.D.      DATE OF BIRTH:  [birth year]   DATE OF ADMISSION:  09/16/2008  DATE OF DISCHARGE:  09/17/2008                               DISCHARGE SUMMARY   CHIEF COMPLAINT AND PRESENT ILLNESS:  This was the first admission to  [facility] for this 45 year old female, history of  depression, initially seen in the emergency room for abdominal pain.  Reported having a stroke while she was there, was seen by Neurology.  Concerned with her history of depression.  She was denying any suicidal  or homicidal ideations, was wanting to go home.  Was asking for  something for pain.  Endorsed feeling depressed, overwhelmed after her  42 year old son killed himself 1 month prior to this admission.   PAST PSYCHIATRIC HISTORY:  First time at [facility].  She sees  __________ at [facility].  Has been seen for approximately 1 month  after her son completed suicide.   SECONDARY HISTORY:  She denies any substance abuse.   MEDICAL HISTORY:  1. Coronary artery disease with stent placed in 2002.  2. Iron deficiency anemia.  3. Chronic pancreatitis.  4. Beta thalassemia.   MEDICATION:  1. Zyprexa 5 mg at bedtime.  2. Celexa 20 mg per day.  3. Ambien for sleep.  4. Trazodone for sleep.  5. Klonopin.  6. Xanax.  7. Roxicodone.   PHYSICAL EXAM:  Failed to reveal any acute findings.   __________ exam reveals alert, cooperative female, fair eye contact.  Speech is soft spoken, sometimes difficult to understand.  Mood is  depressed, affect is constricted.  Thought processes are logical,  coherent and relevant.  Denied any active suicidal or homicidal ideas.  No __________ delusions, hallucinations.  Cognition well-preserved.   ADMISSION DIAGNOSIS:  AXIS I:  Depressive  disorder not otherwise  specified.  AXIS II:  No diagnosis.  AXIS III:  History of coronary artery disease, iron-deficiency anemia,  chronic pancreatitis, beta thalassemia.  AXIS IV:  Moderate.  AXIS V:  On admission 35, high Global Assessment of Functioning in the  last year 60.   COURSE IN THE HOSPITAL:  She was admitted, started in individual and  group psychotherapy.  She claimed that she was taking Xanax 1 mg daily,  Toprol XL 25 mg per day, Celexa 20 mg per day, Zantac 150 mg twice a  day, Plavix 75 mg per day, Zyprexa 5 mg per day, Pancrease 2 caps with  all meals, oxycodone 10/325 every 6 hours as needed for pain, she had 60  filled in April, as well as Ambien 10 mg at bedtime for sleep.  As  already stated, on April 17 her son killed himself.  Endorsed that she  still has thoughts of seeing him in the casket.  She hears his voice.  Her son talks to her, tells her to good things.  After initial medical  stay she went home, things were getting better.  She developed nausea,  vomiting, joint pain.  She came to the emergency room and was admitted  to our unit.  She was given Klonopin and trazodone.  She is on  disability for her thalassemia and depression.  Living with her  boyfriend and his family.  Endorsed she stays to herself, mostly a  loner, does not like to be around people.  She wanted to be discharged,  she was not giving [place] any acute symptomatology, no active suicidal or  homicidal ideations.  She was very connected with outpatient treatment.  We went ahead and discharged to outpatient follow-up.   DISCHARGE DIAGNOSIS:  AXIS I:  Major depression with rule out psychotic  features.  AXIS II:  No diagnosis.  AXIS III:  Coronary artery disease by history, iron-deficiency anemia,  chronic pancreatitis, beta thalassemia.  AXIS IV:  Moderate.  AXIS V:  Upon discharge 50 - 55.   Discharged on:  1. Celexa 20 mg per day.  2. Plavix 75 mg per day.  3. Zantac 150 mg twice a  day.  4. Toprol XL 25 mg per day.  5. Ambien 10 at bedtime for sleep.  6. Zyprexa 5 mg at bedtime.  7. Pancrease with meals.  8. Folic acid 1 mg per day.  9. Iron 325 mg three times a day.   Follow-up at [facility].      Geoffery Lyons, M.D.  Electronically Signed     IL/MEDQ  D:  10/07/2008  T:  10/07/2008  Job:  [id number]

## 2010-09-12 NOTE — Consult Note (Signed)
[name], [name]              ACCOUNT NO.:  [id number]   MEDICAL RECORD NO.:  [id number]          PATIENT TYPE:  EMS   LOCATION:  MAJO                         FACILITY:  [facility]   PHYSICIAN:  Melvyn Novas, M.D.  DATE OF BIRTH:  Jul 15, 1965   DATE OF CONSULTATION:  09/15/2008  DATE OF DISCHARGE:  09/05/2008                                 CONSULTATION   This patient , a 45 year old right ahnded aaf, was just discharged on  Sep 05, 2008, and released into the care of her psychiatrist at the  [facility].   She has a history of diffuse pain, anxiety, somatization disorder, beta  thalassemia trait, history of suicide attempts and ideation, bipolar  disorder, chronic pancreatitis, and gastroparesis.  She status post  hysterectomy.  She has history of colon cancer and has now a Port-A-  Cath.  I do not know if she still receives chemotherapy, and it is unclear to  me where she follos with her cancer care.  .  She is status post recent Helicobacter pylori infection, treatment  was concluded on the last day of April 2010.   She is allergic to latex, penicillin, Reglan, Dilaudid, Compazine,  Zofran, Toradol, and morphine.     She was last discharged on Sep 05, 2008,wiht the following medications:  on folic acid, aspirin 81 mg, Xanax 0.5 t.i.d., Plavix 75 mg daily,  Pancrease three times a day with meals 1 tablet, Ambien at night generic  form 10 mg, Toprol-XL 25 mg daily, Celexa 20 mg daily, Zantac 150 mg  b.i.d., Zyprexa 10 mg nightly, Wellbutrin XL 150 mg in the morning, iron  sulfate 325 mg t.i.d.  The patient follows with her primary care physician, Dr. Dorothyann Peng,  and at her outpatient psychiatric care, the [facility].   Again, the patient presents today on Sep 15, 2008, to the ER as a CODE  STROKE-  where she is frequently seen.  She was sitting in the hall  waiting to be evaluated for a chief complaint of nausea, diarrhea,  vomiting, and generalized malaise.  At 19  hours and 15 minutes, the  patient suddenly claimed to have onset of left-sided weakness.  This did not involve the face, but the left arm and left leg and was  also associated with a profound numbness to all stimuli.  The patient  reported that she felt unwell but noted no pain.  She was brought to room #23 here in the ER for further evaluation and  Dr. Harrison Mons called the Code Stroke.  The patient presented with a blood pressure of 119/77, pulse of 95,  respiration of 22, and these vital signs had been taken upon her being  triaged at about 15 hours today.  It took her 2-1/2 hours of waiting in  the hallway when she developed the onset of left-sided weakness.  The evaluation by Neurology ,after the code stroke was called ,showed an  alert but anxious-appearing African American obese female, right-handed  , who  answered all our questions complacently and did not describe any  acute pain except concern for  the left-sided weakness of her  extremities.  Her Port-A-Cath was accessed by nurse during the neurologic examination  to draw blood.  The patient who was uncomfortable at this procedure was able to flex her  left hand at the wrist and squeeze my hand with the previously paralyzed  left hand.  She shows no peripheral clubbing, cyanosis, or edema.  She had  interestingly equal tone, strength, and mass of all muscles.  When she  had checked into the ER now, there was no strength provided for any of  the maneuvers we asked to performed for [place].  As we performed the NIH stroke scale with the help of the rapid response  team, it became evident that there was no speech or swallowing  difficulty, no dysarthria, no aphasia, and there were full extraocular  movements, full facial movements, full facial sensory.    Tongue and uvula were midline.  No fasciculations.  No deviation.  The patient provided no effort to turn her head to the left and provided  good effort when turning her head towards  the right -this  sternocleidomastoideus sign revealed the non-organic nature of her  complaint the best.  Again, she did not willingly provide any strength in her left upper or  lower extremities.  Her toes were downgoing bilaterally to plantar stimulation and her deep  tendon reflexes remained symmetric.    She did not feel able to resist gravity and while she maintained arm  extension initially, she suddently  showed a rapid drift down to the  mattress level. Did never hit he face.  The same is true for her lower extremities, except that she has actively  resisted having the leg lifted with so-called positive Hover's sign.    Peripheral pulses were all normal.  No clubbing, cyanosis.  No oozing.  No rash.    The patient did not change in mental status or speech or swallowing  ability.  We asked Dr. Harrison Mons to cancel  the Code Stroke.    The CT of the head showed no abnormalities.  It was reviewed after the  physical examination was completed.  I was not able to address the  patient's social or family history, but since this patient has multiple  admissions already within the last month, I gained this information from  previous admission notes.   Surgical and medical history:  According to those, she has a surgical  history of ectopic pregnancy, bilateral tubal ligation, hysterectomy,  colonoscopy, colon cancer was found in 2009 in December ----in [place],  [place].  She states that she has a Port-A-Cath placed for further treatment, but  reported also not following up for continued treatments(?) She is an  active  smoker.  Social history : She denies illegal drug use or drinking.  I am not  aware if the patient lives alone.   Her family history : is positive for coronary artery disease in her  mother,  who died of a heart attack at age 31, positive for  hypertension in two out of two sisters and two out of three brothers.   ASSESSMENT:  Non-organic left-sided weakness in a  patient with  reportedly long psychiatric history including SOMATIZATION disorder,  colon cancer, anxiety.  As to her colon cancer history, information has  been spotty and the patient was supposedly [place] in a hospital [place].  She also claims to have had methicillin-resistant Staphylococcus aureus  cellulitis, pneumonia, and sickle cell crisis.  The patient  believes that she had a stroke in the past and has stated  multiple times in previous admission and ER evaluation, yet her CT shows  no evidence of previous strokes.    In October 2009, she had undergone an MRI of the brain with diffusion-  weighted image.  Again after presenting with slurred speech and left-  sided numbness and weakness,-- it showed no abnormality.  Recommend continued psychiatric care.  The overall time spent on thi  patients care and the intensive panel of test included in a code stroke,  make this a level 5 consult.   CONVERSION  DISORDER   Neurologic followup will not be needed.      Melvyn Novas, M.D.  Electronically Signed     CD/MEDQ  D:  09/15/2008  T:  09/16/2008  Job:  [id number]   cc:   Candyce Churn. Allyne Gee, M.D.  Antonietta Breach, M.D.  [facility]

## 2010-09-12 NOTE — Consult Note (Signed)
NAMESOFIE, SCHENDEL              ACCOUNT NO.:  [id number]   MEDICAL RECORD NO.:  [id number]          PATIENT TYPE:  OBV   LOCATION:  1430                         FACILITY:  [facility]   PHYSICIAN:  Antonietta Breach, M.D.  DATE OF BIRTH:  03-08-1966   DATE OF CONSULTATION:  02/11/2008  DATE OF DISCHARGE:                                 CONSULTATION   REASON FOR CONSULTATION:  Rule out chemical-seeking. Also rule out  somatoform disorder as well as address depression and anxiety.   REQUESTING PHYSICIAN:  [facility].   HISTORY OF PRESENT ILLNESS:  [name] is a 45 year old single  female admitted to the [facility] on February 09, 2008 for  chest pain, nausea and vomiting.   Her mood is within normal limits.  Her interests are intact.  She has  solid hope for the future.  She does not have delusions or  hallucinations.  She is oriented to all spheres.  She is cooperative  with care.  However, she has continued with some somatic complaints  which are not confirmed to have any normal general medical etiologies.  She has continued to wax and wane in a nausea pattern. At some times she  will pass for ice cream while she has just been complaining of nausea.   She also presented with a left-sided paresis complaint, but neurologic  workup was not consistent with any physiologic problem.   She has been maintained on her Celexa which has been effective for anti-  depression.   PAST PSYCHIATRIC HISTORY:  She has no history of mania.  She does have a  history of anxiety which is now controlled with her Celexa.  She has not  made any suicide attempts.  She does have a history of severe depression  that did involve hallucinations. The hallucinations went away as she was  treated for depression.  She did undergo four psychiatric admissions for  depression.   FAMILY PSYCHIATRIC HISTORY:  None known.   SOCIAL HISTORY:  She has four children.  She is divorced.  She just  moved from [place] to live with her niece and her family up in  [place], [place].  She uses alcohol occasionally with no  complications.  She does not use any illegal drugs.   PAST MEDICAL HISTORY:  Please see above.  The patient does express some  indifference about her complaints of neurologic symptoms.   MEDICATIONS:  The MAR are is reviewed.   REVIEW OF SYSTEMS:  Noncontributory.   MENTAL STATUS EXAM:  [name] is alert.  Her eye contact is good.  Her attention span is normal. Concentration normal. Affect broad and  appropriate. Mood within normal limits.  She is oriented to all spheres.  Memory is intact to immediate recent and remote.  Speech within normal  limits.  Thought process logical, coherent, goal-directed.  No looseness  of associations.  Thought content:  No thoughts of harming herself. No  thoughts of harming others.  Insight is partial in regards for  depression history.  Judgment is intact.   ASSESSMENT:  AXIS I:  Depressive  disorder, not otherwise specified,  stable.  Anxiety disorder, not otherwise specified, stable.  Rule out  somatoform disorder, not otherwise specified.  AXIS II:  Deferred.  AXIS III:  See past medical history.  AXIS IV:  Primary support group with a recent move. However, the patient  feels very comfortable and supported with her family.  AXIS V:  55.   [name] is not at risk to harm herself or others.  She agrees to  call emergency services immediately for thoughts of harming herself,  thoughts of harming others or distress.   The undersigned recommended followup with a psychiatrist locally for  medication management as well as a Veterinary surgeon. The patient agreed.   RECOMMENDATIONS:  Would have the social worker set Mrs. Fraser Din with a  psychiatric pharmacotherapy management appointment as soon as possible  within the first week of discharge. Would also have the patient set up  with a local counselor who is trained and capable  in managing somatoform  disorders along with cognitive behavioral therapy combined with deep  breathing and progressive muscle relaxation.   Regarding the patient's psychotropic medication she can be discharged  with her current psychotropic medication. Regarding the Xanax for anti-  acute anxiety would only discharge her with a 1 week prescription and  enough refills to get her to her psychiatric appointment.  She agrees to  not drive if drowsy, and she understands the risk of dependence with her  Xanax. The goal will be to eliminate the Xanax need once she is under  therapy with Celexa combined with the psychotherapy mentioned above.   Regarding any tendency for substance abuse would have her enrolled in  one of the chemical dependence intensive outpatient programs locally.  They are available at [facility], [facility] or [facility].      Antonietta Breach, M.D.  Electronically Signed     JW/MEDQ  D:  02/12/2008  T:  02/12/2008  Job:  [id number]

## 2010-09-12 NOTE — H&P (Signed)
NAMEAYSLIN, KUNDERT              ACCOUNT NO.:  [id number]   MEDICAL RECORD NO.:  [id number]          PATIENT TYPE:  INP   LOCATION:  2004                         FACILITY:  [facility]   PHYSICIAN:  Coralyn Helling, MD        DATE OF BIRTH:  24-Apr-1966   DATE OF ADMISSION:  12/16/2007  DATE OF DISCHARGE:                              HISTORY & PHYSICAL   ADMISSION DIAGNOSIS:  Dyspnea.   [name] is a 45 year old female who I initially had seen on November 26, 2007, for evaluation of her dyspnea.  She had recently moved from  [place] to [place], [place] because of worsening health status.  She has a history of sickle cell with beta thalassemia as well as  coronary artery disease.  She was treated for pneumonia in March 2009 as  well as a sickle cell crisis in June 2009.  She had been getting  progressively more short of breath, and at the time of my initial  evaluation, she had a cellulitis in her right lower abdominal region and  was treated with a course of Avelox, this had improved.  She currently  is planning for chest tightness and wheezing as well as nausea,  vomiting, abdominal pain, and diarrhea.  She says that she has been  having 3-4 episodes of vomitings per day essentially every time she  eats.  She has also been having 3-4 bowel movements a day.  She denies  any hematemesis or melena.  She has been getting soreness in her sternum  as well as in her scapular region.  She does not think she is having any  more fevers.  She is not having any cough.  She continues to smoke  cigarettes.  She has been getting dizzy when she sits up.   Her past medical history is significant for:  1. Coronary disease status post angioplasty with stenting.  2. Hypertension.  3. Gastroesophageal reflux disease.  4. Pneumonia.  5. Anxiety.  6. CVA.  7. Sickle beta thalassemia.  8. MRSA cellulitis.   Past surgical history is significant for right subclavian Port-A-Cath  placement.   Family  history is significant for her sister and grandmother with  clotting disorder.  Her grandmother and sister have breast cancer.  Her  mother has sickle cell disease, colon cancer, and heart disease.  Her  father has sickle cell trait, and her daughter who has sickle cell  disease and a clotting disorder.   SOCIAL HISTORY:  She is single.  She lives with fiance.  She is  currently on disability.  She used to work as a Engineer, civil (consulting).  She currently  smokes 2-3 cigarettes a day, used to smoke up to 1-1/2 pack of  cigarettes per day.   Her current medications are Lopressor 50 mg daily, Plavix 75 mg daily,  Lortab 10/500 as needed, sublingual nitroglycerin as needed, Nexium 40  mg daily, folic acid 5 mg daily, Wellbutrin XL 150 mg b.i.d., and  furosemide 40 mg daily.   Allergies are to LATEX, PENICILLIN, REGLAN, COMPAZINE, TORADOL,  DILAUDID, and ZOFRAN.   PHYSICAL EXAMINATION:  She is seen in my office.  Temperature is 98.3, heart rate is 89, blood pressure is 98/60, ox  saturation is 98% on room air, respiratory rate is 26.  HEENT:  Pupils reactive.  There is no sinus tenderness.  She has a clear  nasal discharge.  There are no oral lesions.  NECK:  No lymphadenopathy.  HEART:  S1 and S2.  No murmur.  CHEST:  She had decreased breath sounds with no wheezing or rales.  ABDOMEN:  Soft, tender in the epigastric area, and decreased bowel  sounds.  There is no rebound or guarding.  EXTREMITIES:  There is no edema, cyanosis, or clubbing.  NEUROLOGIC:  She had normal strength.   Chest x-ray, EKG, and laboratory tests are pending.   IMPRESSION:  1. Dyspnea.  she may have underlying obstructive lung disease.  I will      check her chest x-ray and monitor her oxygen saturation.  I will      also start her empirically on nebulizer therapy.  I have also      strongly encouraged her to be completely abstinent from cigarette      use.  2. Chest pain with a history of coronary disease, hypertension,  and      cerebrovascular accident.  This could be related to underlying      coronary disease.  It is possible that it could also be related to      worsening of her sickle cell disease with pain crisis.  I will      check her EKG and cardiac enzymes.  I will continue her on      Lopressor and Plavix.  Depending upon the results of this, she may      need further cardiology evaluation.  3. Hypertension.  We will need to monitor her blood pressure and this      may limit the use of her Lopressor.  I will start her on      intravenous fluids.  4. Sickle/beta thalassemia.  I am concern that some of her symptoms      may be related to pain crisis.  I will continue her on intravenous      fluid, supplemental oxygen, and pain medications.  5. Nausea, vomiting, abdominal pain, and diarrhea.  This could be      related to her pain crisis.  I will continue her on intravenous      fluids, clear liquid diet, and Protonix.      Coralyn Helling, MD  Electronically Signed     VS/MEDQ  D:  12/16/2007  T:  12/17/2007  Job:  [id number]

## 2010-09-12 NOTE — Discharge Summary (Signed)
NAMEJESIKA, [name]              ACCOUNT NO.:  [id number]   MEDICAL RECORD NO.:  [id number]          PATIENT TYPE:  INP   LOCATION:  6709                         FACILITY:  [facility]   PHYSICIAN:  Coralyn Helling, MD        DATE OF BIRTH:  05/30/1965   DATE OF ADMISSION:  12/16/2007  DATE OF DISCHARGE:  12/25/2007                               DISCHARGE SUMMARY   ADDENDUM   Please add it to discharge job [id number].   FINAL DIAGNOSES:  1. Beta thalassemia trait.  2. Drug seeking.  3. History of coronary artery disease.  4. Gastroparesis.   DISCHARGE LABORATORY DATA:  Hemoglobin evaluation; hemoglobin A2 is 5.3,  hemoglobin S 1.9, and hemoglobin A is 92.8%.  On December 24, 2007, sodium  141, potassium 4.2, chloride 109, CO2 27, glucose 95, BUN 9, and  creatinine 0.69.   HISTORY:  [name] had been cleared for home on December 19, 2007,  however, continued to have significant hallucinations and difficulty  with orientation, necessitating transfer to intensive care for over  concern for acute psychosis and concern for self-harm.  She was seen  emergently by Dr. Jeanie Sewer, who initiated therapy with improvement in  symptomatology to the point where on December 23, 2007, she was actually  cleared for outpatient therapy and counseling.   HOSPITAL COURSE BY DISCHARGE DIAGNOSES:  1. Beta thalassemia trait.  This was initially diagnosed as a what the      patient told us was prior history of sickle cell with sickle cell      crisis.  Hemoglobin electrophoresis tests were done in consistent      with beta thalassemia trait.  It was felt likely, symptomatology      obtained was more related to pain medication seeking behaviors than      actual sickle cell crisis.  She did have atypical chest pain as      well, this was ruled out with cardiac enzymes and with no evidence      of ischemia by 12-lead EKG.  She therefore will be discharged to      home on maintenance analgesics and followup in the  outpatient      setting.  2. Drug-seeking behavior.  As mentioned above.  3. History of coronary artery disease.  No acute evidence of ischemia.      Plan for this will be to discharge to home on maintenance beta      blockade, and Plavix.  4. Gastroparesis.  This is much improved on EryPed 200 mg twice daily.      She has been followed by Dr. Christella Hartigan in the inpatient setting.  She      will be discharged to home with follow up with GI.  5. Acute delirium.  This is resolved following the additional      pharmaceutical therapy including Celexa and Zyprexa as initiated by      Dr. Jeanie Sewer.   DISCHARGE INSTRUCTIONS:   DIET:  As tolerated.   ACTIVITY:  As tolerated.   FOLLOW UP:  Dr. Rob Bunting on January 13, 2008,  at 2:30, and Dr.  Coralyn Helling on January 14, 2008, at 2:30.  She also will have an  outpatient followup with Psychiatry.   DISCHARGE MEDICATIONS:  1. Celexa 20 mg daily.  2. EryPed 5 mL twice a day.  3. Lopressor 25 mg twice a day.  4. Plavix 75 mg daily.  5. Nexium 40 mg daily.  6. Zyprexa 5 mg before bed.  7. Lortab 500/7.5 one tablet every 6 hours as needed for pain.   DISPOSITION:  [name] has met maximum benefit from inpatient  hospital care.  She will be discharged to home with outpatient follow up  as mentioned above.      Zenia Resides, NP      Coralyn Helling, MD  Electronically Signed    PB/MEDQ  D:  12/25/2007  T:  12/25/2007  Job:  [id number]

## 2010-09-12 NOTE — Consult Note (Signed)
NAMESHANELE, NISSAN              ACCOUNT NO.:  [id number]   MEDICAL RECORD NO.:  [id number]          PATIENT TYPE:  INP   LOCATION:  2040                         FACILITY:  [facility]   PHYSICIAN:  Antonietta Breach, M.D.  DATE OF BIRTH:  Apr 15, 1966   DATE OF CONSULTATION:  12/23/2007  DATE OF DISCHARGE:                                 CONSULTATION   [name] is no longer having delusions that her son has died.  She  also is no longer having auditory hallucinations.   Her memory function is intact also her orientation is intact.  She is  cooperative with staff care.   REVIEW OF SYSTEMS:  NEUROLOGIC:  No stiffness or other extrapyramidal  side effects.   EXAMINATION:  VITAL SIGNS:  Temperature 98.2, pulse 106, respiratory  rate 18, blood pressure 113/70, O2 saturation on room air 98%.   MENTAL STATUS EXAM:  [name] is alert.  She is oriented to all  spheres.  Her concentration is mildly decreased.  Her affect is mildly  flat.  Her mood is within normal limits.  Memory 3/3 immediate, 3/3 at 3-  minutes. Speech involves normal rate and prosody without dysarthria.  Thought process logical, coherent, goal-directed.  No looseness of  associations.  Thought content with no thoughts of harming herself, no  thoughts of harming others, no delusions, no hallucinations.  Insight is  intact. Judgment is intact.   ASSESSMENT:  1.)  293.00  Delirium, not otherwise specified, now  resolved.  2.)  Rule out 293.82 Psychotic, not otherwise specified, now resolved.  3.)  293.84  Anxiety disorder, not otherwise specified   [name] agrees to call emergency services immediately for any  thoughts of harming herself, thoughts of harming others or  hallucinations.   Ms.  [name] is psychiatrically cleared for discharge if she continues  with her current stable mental status exam.   RECOMMENDATIONS:  1. Would increase her Celexa for antianxiety to 20 mg daily.  2. Would continue the Zyprexa  5 mg nightly for anti psychosis at this      time, to be tried off within the first month of discharge.  3. Would discontinue her sitter.  4. Would have this patient set up with outpatient psychiatric      treatment within the first week of discharge.      Antonietta Breach, M.D.  Electronically Signed    JW/MEDQ  D:  12/23/2007  T:  12/23/2007  Job:  [id number]

## 2010-09-12 NOTE — Discharge Summary (Signed)
[name], [name]              ACCOUNT NO.:  [id number]   MEDICAL RECORD NO.:  [id number]          PATIENT TYPE:  INP   LOCATION:  4713                         FACILITY:  [facility]   PHYSICIAN:  Elmore Guise., M.D.DATE OF BIRTH:  July 30, 1965   DATE OF ADMISSION:  05/16/2008  DATE OF DISCHARGE:  05/18/2008                               DISCHARGE SUMMARY   DISCHARGE DIAGNOSES:  1. History of drug-seeking behavior.  2. History of a beta thalassemia trait.  3. History of depression and anxiety.  4. History of somatization disorder.  5. History of coronary artery disease with percutaneous coronary      intervention in 2002.   HISTORY OF PRESENT ILLNESS:  [name] is a 45 year old African  American female, who has had multiple admissions since last summer for  evaluation of chest pain.  Her admissions are very similar.  She will  come in with 10/10 chest pain and only have relief by requesting either  large doses of morphine or Demerol.  She went to [facility] for  evaluation on May 16, 2008.  Her EKG was normal.  Her enzymes were  negative.  However, because of her symptoms, a transfer was requested.  On arrival here, the patient had had nitroglycerin with no relief in her  symptoms and again required morphine for treatment.  Prior to arrival,  she had been given 14 mg of morphine to help stabilize her pain.  She  was seen by the on-call physician and was hemodynamically stable without  objective evidence of ongoing heart problems.  Her opioids were changed  to oral at her home dosage.  She was gently hydrated.  Again, her  enzymes continued to be negative.  After she was told she would not  receive any further Demerol, the patient requested to sign out against  medical advice.  However, after discussion, she decided to stay.  The  patient was seen early the next morning, her pain had resolved.  An  echocardiogram was performed, which showed vigorous LV function and  an  EF of 65%.  She had no wall motion abnormalities and no significant  valvular heart disease.  The patient was transferred to the floor for a  further evaluation.  She remained hemodynamically stable and again was  requesting IV narcotics.  After a long discussion was held with the  patient, we discussed that no further IV narcotics would be given.  She  would take her Lortab, however, on a schedule of 1-2 tablets every 6-8  hours rather than every 4-6 hours.  The patient again wanted to leave  the hospital and was subsequently discharged later during the day on  May 18, 2008.  We will set her up for an outpatient stress test to  evaluate for any ischemic heart disease.  The patient has been seen by  another cardiologist in the past and has failed to show up for routine  office visits.  I discussed that I think she is overmedicated with  significant amount of polypharmacy, and she has significant opioid  dependence.  We actually talked  about getting her some either outpatient  or inpatient help; however, the patient denies that she is having any  further problems at this time.  Her blood work during her  hospitalization showed hemoglobin A1c of 5.3, TSH is 0.8, a BUN and  creatinine of 8 and 0.7 with a potassium of 3.7.  Her cardiac markers  were negative with a peak CPK of 50.  Her BNP was negative.  Her D-dimer  also was negative.  Her CBC showed a white count of 8.8, hemoglobin of  10.1, and platelet count of 350, which is really no change from her last  hospitalization.   She will be discharged home on the following medications:  1. Toprol-XL 25 mg daily.  This is a new medicine for her ask.  I have      asked her to discontinue her Lopressor 50 mg twice daily.  2. Plavix 75 mg daily.  3. Aspirin 81 mg daily.  4. Lortab 5/500, #20 was given, this is down from her normal dosage of      10/500, which she takes every 4-6 hours.  5. She could also take her Flexeril on an  as-needed basis.  6. She was taking both Nexium and Prilosec.  We will discontinue the      Nexium, and she will only continue her Prilosec at this time.  7. We will continue her Lipitor 40 mg daily.  8. Wellbutrin 150 mg daily.  9. Ambien 10 mg nightly p.r.n.  10.Premarin 0.625 mg daily.  11.Claritin 10 mg daily.  12.Her Lasix will be changed from daily dosing to only as needed for      lower extremity edema.  I discussed I think some of her symptoms      could be from over diuresis when she has poor p.o. intake.  Her      dehydration could set off a pain crisis spell.  13.She will also continue her Zyprexa at 25 mg daily.  14.She does also take folic acid daily.   She is on multiple psychiatric meds.  I did discuss she needs to be seen  by psychiatric professional to help see if she can decrease some of her  medications.  She understands all of these recommendations.  Her  followup appointment will be with Dr. Reyes Ivan at [facility] in  2 weeks.  She is to call if she has any further problems.  I did  encourage her to have complete tobacco cessation.      Elmore Guise., M.D.  Electronically Signed     TWK/MEDQ  D:  05/18/2008  T:  05/19/2008  Job:  [id number]

## 2010-09-12 NOTE — Discharge Summary (Signed)
NAMEGEMA, RINGOLD              ACCOUNT NO.:  [id number]   MEDICAL RECORD NO.:  [id number]          PATIENT TYPE:  INP   LOCATION:  5531                         FACILITY:  [facility]   PHYSICIAN:  Coralyn Helling, MD        DATE OF BIRTH:  10-08-1965   DATE OF ADMISSION:  12/16/2007  DATE OF DISCHARGE:  12/20/2007                               DISCHARGE SUMMARY   DISCHARGE DIAGNOSES:  1. Gastroparesis with history of peptic ulcer disease and      pancreatitis.  2. Dyspnea with history of tobacco use.  3. Sickle cell disease with acute crisis.  4. History of coronary artery disease.  5. Probable situational depression.   LABORATORY DATA:  Hemoglobin 10.3, hematocrit 32.3, white blood cell  count 4, platelet count 284.  Sodium 142, potassium 3.9, chloride 113,  CO2 of 24, BUN 8, and creatinine 0.7.   CONSULTANTS:  1. Dr. Leone Payor with Gastroenterology.  2. Dr. Rob Bunting with Gastroenterology.   PROCEDURES:  Esophagogastroduodenoscopy completed on December 18, 2007.   FINDINGS:  Moderate to large amount of retained solid and liquid food  supporting gastroparesis.   BRIEF HISTORY:  The patient 45 year old female patient followed by Dr.  Craige Cotta in the outpatient setting for most of her medical therapy presented  initially for evaluation of dyspnea.  She has a known history of sickle  cell with beta thalassemia as well as coronary artery disease.  Presented with progressive shortness of breath on December 16, 2007 as  well as chest tightness, wheezing, nausea, vomiting, and abdominal pain  as well as diarrhea.  She had been complaining of progressive soreness  in her sternum as well as her scapular region.  No fevers, no coughs.  She is an active smoker.  She was admitted for further evaluation and  therapy.   HOSPITAL COURSE BY DISCHARGE DIAGNOSES:  1. Sickle cell crisis.  Ms. Diven was admitted to the regular      medical unit.  She was treated with IV aggressive hydration, and  symptomatic support with narcotics.  She has now improved upon the      time of discharge with typical follow up as indicated.  2. Gastroparesis.  This was diagnosed by EGD as mentioned above.  For      this, she has been initiated on oral erythromycin.  Upon time of      discharge, her gastric tolerance is continuing to improve, however,      she still has significant nausea.  Gastroenterology is following,      she has follow up in the outpatient setting for this.  3. Situational depression.  Unfortunately, the day Ms. Malbrough was      admitted, she also had a tragic loss of her son.  Since this time,      her hospitalization has been complicated by auditory      hallucinations, mood alteration.  For this Dr. Jeanie Sewer was      consulted.  Medication regimens have been changed.  She continues      on a Wellbutrin twice daily, and also has  Xanax p.r.n. for      situational anxiety and depression.  4. Dyspnea with chest discomfort.  Cardiac enzymes have been negative.      Symptoms have improved with symptomatic support, as well as      hydration.  She has required some p.r.n. beta agonist, however, no      other significant therapy.  5. History of coronary artery disease.  This will be managed primarily      with p.r.n. nitroglycerin.  6. Tobacco abuse.  Again, she has been counseled about the importance      of smoking cessation.   DISCHARGE INSTRUCTIONS:  Diet as tolerated.   FOLLOWUP:  Dr. Rob Bunting, [place], GI, January 13, 2008, Dr.  Coralyn Helling on December 25, 2007, at 2:30 p.m.   DISCHARGE MEDICATIONS:  1. Folic acid 1 mg daily.  2. Lopressor 25 mg twice a day.  3. Plavix 75 mg daily.  4. Nexium 40 mg daily.  5. Lortab 10/651 every 4 hours as needed for pain.  6. Wellbutrin 150 mg tablet twice a day.  7. Xanax 0.5 mg tablet every 8 hours as needed.  8. Nitroglycerin SL as directed.  9. EryPed 5 mL twice a day.   DISPOSITION:  Ms. Sonnier will be discharged to home  as soon as she is  tolerating her regular diet.  We will follow up as mentioned.      Zenia Resides, NP      Coralyn Helling, MD  Electronically Signed    PB/MEDQ  D:  12/19/2007  T:  12/20/2007  Job:  [id number]

## 2010-09-12 NOTE — H&P (Signed)
[name], [name]              ACCOUNT NO.:  [id number]   MEDICAL RECORD NO.:  [id number]          PATIENT TYPE:  INP   LOCATION:  2602                         FACILITY:  [facility]   PHYSICIAN:  Della Goo, M.D. DATE OF BIRTH:  09-Oct-1965   DATE OF ADMISSION:  06/19/2008  DATE OF DISCHARGE:                              HISTORY & PHYSICAL   PRIMARY CARE PHYSICIAN:  Unassigned.  Her primary care physician in  [place], Dr. Yetta Barre.   CHIEF COMPLAINTS:  Chest pain.   HISTORY OF PRESENT ILLNESS:  This is a 45 year old female with multiple  medical problems including sickle cell anemia and beta thalassemia,  along with coronary artery disease status post stent placement x1, who  presents to the emergency department secondary to complaints of chest  pain which is substernal and has been occurring off and on for the past  24 hours.  The patient rates the pain as being an 8-9/10.  She also  states that she has had nausea and vomiting associated with her  symptoms.  She denies having any shortness of breath or pleuritic chest  pain.  She does state that she has had increased pain in her joints and  this has been constant pain for 1 month.  She states this is similar to  her sickle cell crisis pain.   The patient was evaluated initially by the emergency department  physician and during this workup, the patient suffered an episode of  left-sided weakness and a Code Stroke was called.  The neurologist on  call was consulted to see this patient.  The patient was evaluated at  that time, had a stat CT scan of the head which returned negative.  A  CVA/TIA workup was then initiated.  The patient's symptoms resolved.   PAST MEDICAL HISTORY:  Significant for:  1. Sickle cell anemia, beta thalassemia.  Per the medical records the      patient has a self-reported history of sickle cell disease.  A      workup was performed with a hemoglobin electrophoresis which      revealed that the  patient has beta thalassemia trait only.  2. Coronary artery disease status stent placement x1.  3. Chronic pancreatitis.  4. Reported history of colon cancer.  5. Somatization disorder.  6. Anxiety and depression.  7. Reported history of a CVA in the past.  8. Documented drug seeking behavior.   PAST SURGICAL HISTORY:  History of surgery for an ectopic pregnancy.  Also history of a bilateral tubal ligation.  The patient also reports  having a colonoscopy performed in December 2009 at the [place],  [place], hospital in which she states she was told that she had colon  cancer and reports not following up for continued evaluation or  treatment.   MEDICATIONS:  At this time include:  1. Metoprolol XL 25 mg one p.o. daily.  2. Plavix 75 mg one p.o. daily.  3. Wellbutrin 150 mg one p.o. daily.  4. Lasix 40 mg one p.o. daily.  5. Xanax 0.5 mg one p.o. t.i.d.  6. Folic acid 1 mg p.o.  daily.  7. Celexa and Zyprexa, dosages need to be further verified.  8. Flexeril 10 mg one p.o. and the interval needs to be further      verified.  9. Pancrease/lipase enzymes 4500 units 1 tablet p.o. t.i.d. 30 minutes      before meals.  10.Omeprazole 20 mg one p.o. daily.  11.Aspirin 325 mg one p.o. daily.  12.Lortab 10/650 one p.o. q.6 h p.r.n.  13.Ambien 10 mg one p.o. q.h.s.  14.Also of note, this patient is on Premarin; however she does not      report having a hysterectomy.  The dosage of Premarin is 0.625 mg      one p.o. daily.   ALLERGIES:  Multiple:  The patient has reported ALLERGIES OF LATEX,  PENICILLIN, REGLAN, AND DILAUDID which cause swelling of the tongue rash  and itching.  She has allergies to COMPAZINE, ZOFRAN, TORADOL, MORPHINE  AND FENTANYL which cause itching and rash.   SOCIAL HISTORY:  The patient is a smoker.  She smokes 4 to 5 cigarettes  daily.  She denies any drinking or illicit drug usage.   FAMILY HISTORY:  Positive for coronary artery disease in her mother who   died of a heart attack.  Positive hypertension in 2 sisters and 2  brothers.  No history of diabetic disease in her family.  Positive  cancer in her grandmother.   REVIEW OF SYSTEMS:  Pertinents are mentioned above.   PHYSICAL EXAMINATION:  FINDINGS:  This is a 45 year old overweight, well-  developed female in no visible discomfort or acute distress.  VITAL SIGNS:  Temperature 98.4, blood pressure 105/74, heart rate 90,  respirations 24, O2 saturation 98% on room air.  HEENT EXAMINATION:  Normocephalic, atraumatic.  Pupils are reactive to light bilaterally.  There is no scleral icterus.  Extraocular movements are intact.  Funduscopic benign.  Nares are patent bilaterally.  Oropharynx is clear.  NECK:  Supple full range of motion.  No thyromegaly, adenopathy, or  jugular venous distention.  CARDIOVASCULAR:  Regular rate and rhythm.  Positive systolic ejection  murmur heard no gallops.  No rubs.  LUNGS:  Clear to auscultation bilaterally.  ABDOMEN: Positive bowel sounds, soft, nontender, nondistended.  EXTREMITIES: Without cyanosis, clubbing or edema.  NEUROLOGIC EXAMINATION:  Alert and oriented x3.  Cranial nerves are  intact.  There are no focal deficits on examination.   LABORATORY STUDIES:  White blood cell count 7.4, hemoglobin 11.2,  hematocrit 34.8, MCV 73.6, platelets 338, neutrophils 56%, lymphocytes  37%.  Sodium 140, potassium 3.6, chloride 106, carbon dioxide 28, BUN  12, creatinine 1.05, glucose 94, calcium level 9.0.  Point care cardiac  markers with a myoglobin of 41.6, CK-MB less than 1.0, troponin less  than 0.05.  Cardiac enzymes with a total CK of 78, CK-MB 0.6 and  troponin less than 0.01.  Beta natriuretic peptide less than 30.0, D-  dimer less than 0.22.  Pro time 14.0, INR 1.1 and PTT 28.   Chest x-ray reveals no acute disease findings.  The patient has a Port-A-  Cath present.  CT scan of the head performed and revealed no acute  intracranial abnormality.  EKG  reveals a normal sinus rhythm without and  nonspecific ST-segment changes are seen.   ASSESSMENT:  A 45 year old female being admitted with:  1. Chest pain.  2. Episodic left-sided weakness/possible transient ischemic attack.  3. Mild microcytic anemia.  4. Coronary artery disease history.   PLAN:  The patient will be  admitted to telemetry area for cardiac  monitoring.  A cardiac workup will be initiated.  The patient has been  ordered nitro paste therapy, however, she has refused this.  She has  also been placed on aspirin therapy and supplemental oxygen therapy.  The patient will also be placed on DVT and GI prophylaxis.  A CVA versus  TIA workup will be started as well.  Please refer to the consultation  note by the neurologist on call,  Dr. Roseanne Reno.  The patient's regular  medications have been reviewed and most of her medications have been  continued except a few will have to be further verified.  Medical  records from [place], [place], will also be requested to follow up on  the patient's reported history of colon cancer.   ADDENDUM:  Note the patient suffered an allergic reaction which is noted  to morphine which causes her to itch and was medicated with multiple  doses of IV Benadryl therapy without relief as well.  An order was given  to administered IV Pepcid therapy in addition to the Benadryl to help  decrease to her allergic reaction/itching; however, the patient refused  this and stated that Pepcid is a stomach medication and not an  medication for allergies.  Nursing was asked to document the patient's  refusal of the medications.      Della Goo, M.D.  Electronically Signed     HJ/MEDQ  D:  06/20/2008  T:  06/20/2008  Job:  [id number]

## 2010-09-12 NOTE — Cardiovascular Report (Signed)
[name], [name]              ACCOUNT NO.:  [id number]   MEDICAL RECORD NO.:  [id number]          PATIENT TYPE:  OIB   LOCATION:  2508                         FACILITY:  [facility]   PHYSICIAN:  Nanetta Batty, M.D.   DATE OF BIRTH:  1966-02-06   DATE OF PROCEDURE:  07/16/2008  DATE OF DISCHARGE:  07/16/2008                            CARDIAC CATHETERIZATION   [name] is a 45 year old mildly overweight divorced African American  female, mother of 4, grandmother to 1 grandchild, referred by Dr.  Concepcion Elk for a cardiovascular evaluation because of chest pain.  Ms.  Trawick is disabled because of sickle cell beta thalassemia trait as  well as depression.  Risk factors included 25-pack years of tobacco  abuse, hypertension.  Her mother did die at age 49 of an MI.  She  apparently had a stroke in [place] in 2006 and had a stent at [facility] in 2002 according to the patient.  Over the last several weeks,  she has had acceleration in substernal chest pain and with increasing  shortness of breath and diaphoresis.  This has awakened her from sleep.  She presents now for outpatient diagnostic coronary arteriography to  define her anatomy and rule out an ischemic etiology.   DESCRIPTION OF PROCEDURE:  The patient was brought to the second floor  of [facility] in the postabsorptive state.  She was  premedicated with p.o. Valium and IV Versed.  Her right groin was  prepped and shaved in the usual sterile fashion.  Xylocaine 1% was used  for local anesthesia.  A 6-French right and left Judkins diagnostic  catheters as well as a 6-French pigtail catheter were used for selective  cholangiography and left ventriculography respectively.  Visipaque dye  was used for the entirety of the case.  The aortic pressures were  monitored at the end of the case.  It should be noted that the patient  received IV Pepcid, Benadryl, and Solu-Medrol for a contrast allergy  prophylaxis.   Non-latex gloves were also worn because of LATEX allergy.  Venous sheath was placed for venous access because of poor prophylaxis.   HEMODYNAMICS:  1. Aortic systolic pressure 120, diastolic pressure 66.  2. Left ventricular systolic pressure 100 and end-diastolic pressure      of 3.   SELECTIVE CORONARY ANGIOGRAPHY:  1. Left main normal.  2. LAD normal.  3. Left circumflex normal.  4. Right coronary was dominant and normal.  5. Left ventriculography; RAO left ventriculogram was performed using      25 mL of Visipaque dye at 12 mL per second.  The overall LVEF was      estimated greater than 60% without focal wall motion abnormalities.   IMPRESSION:  [name] has normal coronary arteries, normal left  ventricular function.  I cannot visualize a stent and wonder whether  this was ever done.  Her chest pain is clearly noncardiac.  The arterial  sheath was pulled  and pressure was held in the groin to achieve hemostasis.  The patient  left the lab in stable condition.  She  will be hydrated via her venous  sheath for the next 5 hours and discharged in the morning.  The patient  left the lab in stable condition.       Nanetta Batty, M.D.  Electronically Signed     JB/MEDQ  D:  07/16/2008  T:  07/17/2008  Job:  [id number]   cc:   Second Floor [facility].  [facility].  Fleet Contras, M.D.

## 2010-09-12 NOTE — H&P (Signed)
[name], CONREY              ACCOUNT NO.:  [id number]   MEDICAL RECORD NO.:  [id number]          PATIENT TYPE:  OBV   LOCATION:  3032                         FACILITY:  [facility]   PHYSICIAN:  Darryl D. Prime, MD    DATE OF BIRTH:  01/13/1966   DATE OF ADMISSION:  08/25/2008  DATE OF DISCHARGE:                              HISTORY & PHYSICAL   CODE STATUS:  Full code.   PRIMARY CARE PHYSICIAN:  Dr. Dorothyann Peng.   CHIEF COMPLAINT:  The patient is here for pain in the chest, arms and  legs.   HISTORY OF PRESENT ILLNESS:  [name] is a 45 year old female with a  history of presumed sickle cell disease and beta thalassemia with  frequent visits to the emergency room for chest pain and diffuse joint  pain.  She notes a history of coronary artery disease status post PCI in  2002 in [place].  She had a cardiac catheterization in March 2010  showing normal coronaries with no evidence of a stent, EF was 60%.  She  has a history of documented drug seeking behavior, history of possible  polysubstance abuse who presents with diffuse pain.  The patient notes  diffuse pain in the joint knees for the last week with associated nausea  and vomiting.  She notes chest pain as well abdominal pain that has been  progressive.  She notes no fevers.  No dysuria or hematuria.  She notes  no cough.  She notes significant shortness of breath, however.  The  patient notes she saw her primary care physician and he suggested that  she get a CT scan, and she brought that order with her.  The CT scan is  of the abdomen to rule out significant abdominal disease.  The patient,  in the emergency room, was given 30 mg of IV morphine, Phenergan was  also given Benadryl, ciprofloxacin for urinary tract infection with  minimal relief.  She did come into the emergency room 2 days ago with  abdominal pain that resolved after analgesics.   PAST MEDICAL/SURGICAL HISTORY:  1. History of pneumonia in March of  2009.  2. History of anxiety and depression.  3. History of status post hysterectomy.  4. History of gastroparesis.  5. History of chronic pancreatitis.  6. History of stroke.  7. History of iron-deficiency anemia.  8. History of colon cancer per the patient, but does not follow up on      this.  9. History of bilateral tubal ligation.  10.History of ectopic pregnancy.  11.History of chest pain.   ALLERGIES:  SHE IS ALLERGIC TO LATEX, PENICILLIN, REGLAN, DILAUDID,  COMPAZINE, ZOFRAN, TORADOL, MORPHINE, ALTHOUGH SHE DOES TAKE MORPHINE  AND FENTANYL.   MEDICATIONS:  1. Xanax 0.5 mg to 1 mg three times a day as needed.  2. Aspirin 81 mg daily.  3. Celexa 10 mg daily.  4. Plavix 75 mg daily.  5. Metoprolol in the form of Toprol XL 25 mg daily.  6. Omeprazole 20 mg daily.  7. Creon before meals.  8. Wellbutrin XL 150 mg daily.  9. Iron sulfate 325 mg three times a day.  10.Lortab 10/650 every 6 hours as needed.   SOCIAL HISTORY:  History of tobacco abuse.  She denies any other illicit  drug use or any alcohol use.  She smokes about 4-6 cigarettes a day.  The patient's family history is positive for coronary artery disease and  hypertension.   REVIEW OF SYSTEMS:  A 14-point review of systems negative unless stated  above.   PHYSICAL EXAMINATION:  VITAL SIGNS: The patient's blood pressure is  118/81, pulse of 95, respiratory rate 18, temperature 97.8 with sats 95%  on room air.  GENERAL:  She is a chronically ill-appearing female sitting upright in  no acute distress.  HEENT: Normocephalic, atraumatic.  Pupils equal,  round and reactive to light.  Extraocular being intact.  No scleral  icterus.  NECK:  Supple with full range of motion.  No thyromegaly or adenopathy.  No jugular venous distention.  CARDIOVASCULAR:  Regular rhythm and rate.  She has a positive 1/6  systolic ejection murmur.  No gallops or rubs.  LUNGS:  Clear to auscultation bilaterally.  ABDOMEN:  Soft,  nontender, nondistended.  No splenomegaly.  On deep  palpation she notes diffuse abdominal pain.  EXTREMITIES:  No clubbing, cyanosis or edema.  NEUROLOGICAL:  She is alert and oriented x4.  Cranial nerves II-XII  grossly intact.  Strength and sensation grossly intact.   LABORATORY DATA:  Her urinalysis shows WBCs 21-50, but there is many  squamous cells.  RBCs 21-50, bacteria many, nitrate positive, leukocyte  esterase, large blood, bili moderate, ketone trace.  Sodium is 138 with  potassium 3.5, chloride 105, bicarb 25, BUN 6, creatinine 0.69, glucose  83, otherwise normal LFTs.  Cardiac markers at 1427 were unremarkable.  White count was 5.7 with hemoglobin of 10.6, hematocrit 32.7, platelets  314 which is around her baseline, segs are 57%.  She had a reticulocyte  count of 1.9% in percentage.  The patient is in normal range.  Urine  pregnancy test HCG was negative.  She had lipase 2 days ago but was 32.   LABORATORY DATA:  Urine drug screen is positive for benzodiazepines,  cocaine and opiates.  Her lactic acid was 0.7.  Cardiac markers drawn  again at 22, 100 were unremarkable.  The patient's abdomen acute showed  a nonobstructive bowel gas pattern.  Chest x-ray showed trachea midline.  Heart size normal with a right IJ Port-A-Cath projecting over the SVC.  Lungs are clear.   ASSESSMENT/PLAN:  This is a patient with a history of possible sickle  cell anemia with a possible sickle cell crisis now.  She does have a  history of multiple admissions for pain.  She will be admitted for pain  control, and will get a hemoglobin electrophoresis to further define her  sickle cell disease.  We will get place her on IV fluids, and will treat  her urinary tract infection with a fluoroquinolone.  Will give her  antiemetics.  DVT and GI prophylaxis  will be ordered.  Will get an EKG regarding her chest pain.  The patient  did bring an order for a CT scan of the abdomen that she was requesting.   She then decided not to have a CT of the abdomen done for uncertain  reasons when asking her.  We will follow her symptomatically.      Darryl D. Prime, MD  Electronically Signed     DDP/MEDQ  D:  08/26/2008  T:  08/26/2008  Job:  [id number]
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
## 2010-09-12 NOTE — H&P (Signed)
[name], [name]              ACCOUNT NO.:  [id number]   MEDICAL RECORD NO.:  [id number]          PATIENT TYPE:  IPS   LOCATION:  0500                          FACILITY:  [facility]   PHYSICIAN:  Geoffery Lyons, M.D.      DATE OF BIRTH:  [birth year]   DATE OF ADMISSION:  09/16/2008  DATE OF DISCHARGE:                       PSYCHIATRIC ADMISSION ASSESSMENT   A 45 year old female voluntarily admitted on Sep 16, 2008.   HISTORY OF PRESENT ILLNESS:  The patient presents with history of  depression.  The patient was initially seen in the emergency department  for abdominal pain.  Reported having a stroke while she was there.  Was  seen by Neurology.  There were concerns with her history of depression.  The patient will be admitted for decompensation.  She denies any  hallucinations.  Denies any suicidal or homicidal thoughts and was  wanting to go home.  She is asking for something for pain.  The patient also reports feeling very depressed and feeling very  overwhelmed after her 25 year old son killed himself approximately 1  month ago.   PAST PSYCHIATRIC HISTORY:  First admission to the [facility].  Has a therapist, Danice Goltz at [employer].  Has been  seeing her for approximately 1 month after her son completed suicide.   SOCIAL HISTORY:  This is a 45 year old female.  She lives in [place].  She is on disability.  She lives with her boyfriend.   FAMILY HISTORY:  Unknown.   ALCOHOL/DRUG HISTORY:  The patient smokes.  Denies any alcohol or drug  use.   PRIMARY CARE Emannuel Vise:  [facility] and Dr. Lelon Perla.   MEDICAL PROBLEMS:  1. The patient reports a history of coronary artery disease and had a      stent placed in 2002. 2.  Iron deficiency anemia.  2. Chronic pancreatitis and beta thalassemia, minor trait.   MEDICATIONS:  1. Zyprexa 5 mg at bedtime.  2. Celexa 20 mg daily.  3. Ambien for sleep.  4. Trazodone for sleep.  5. Klonopin.  6.  Xanax.  7. Roxicodone.   DRUG ALLERGIES:  1. LATEX.  2. PENICILLIN.  3. REGLAN.  4. COMPAZINE.  5. TORADOL.  6. DILAUDID.  7. ZOFRAN.   PHYSICAL EXAMINATION:  GENERAL:  This is a middle-aged female fully  assessed at [facility] Emergency Department.  She today is complaining  of some abdominal pain stating that she is in a crisis.  VITAL SIGNS:  Temperature 98.4, 80 heart rate, 20 respirations, blood  pressure is 110/60.   MENTAL STATUS EXAM:  The patient is in bed at this time.  She is fully  alert.  Fair eye contact.  Her speech is soft-spoken and sometimes  difficult to understand.  Mood is neutral.  The patient's affect is  flat.  Thought processes are coherent and goal directed.  Denies any  suicidal thoughts.  No delusional statements made.  Cognitive  functioning is intact.  The patient sometimes gives confusing history as  to providers and current medications.  Judgment and insight are fair.   AXIS I:  Depressive disorder not otherwise specified.  AXIS II:  Deferred.  AXIS III:  History of coronary artery disease, iron deficiency anemia,  chronic pancreatitis and beta thalassemia, minor trait.  AXIS IV:  Psychosocial problems related to grief with death of her son  and other medical problems.  AXIS V:  Current is 35-40.   PLAN:  Our plan at this time is to clarify her medications, resume her  medications.  We will clarify her support.  Verify her followups with  her therapist and psychiatrist.  The patient will be advised to follow  up her medical problems with her primary care Najiyah Paris and specialist as  advised.  Her tentative of stay at this time is 1-2 days.      Landry Corporal, N.P.      Geoffery Lyons, M.D.  Electronically Signed    JO/MEDQ  D:  09/17/2008  T:  09/17/2008  Job:  [id number]

## 2010-09-12 NOTE — Consult Note (Signed)
[name], [name]              ACCOUNT NO.:  [id number]   MEDICAL RECORD NO.:  [id number]          PATIENT TYPE:  INP   LOCATION:  1430                         FACILITY:  [facility]   PHYSICIAN:  Deanna Artis. Hickling, M.D.DATE OF BIRTH:  [birth year]   DATE OF CONSULTATION:  02/10/2008  DATE OF DISCHARGE:                                 CONSULTATION   CHIEF COMPLAINT:  Code stroke.   HISTORY OF PRESENT CONDITION:  I was called by rapid response nurse from  [name] at [phone].  Unfortunately, because her phone was busy, I was  unable to contact her and thus unable to find out about the nature of  the problem or where the patient was located.  I was called again by  rapid response nurse at 0550 from the patient's room (1430 at [name]).  I arrived at between 0615 and 0620 from home.  The patient was  last known normal around 0430.  At 0505, the patient while awake  experienced weakness in her left arm and called nursing stat, which  noted her to have left-sided weakness and slurred speech.   Dr. Vania Rea, who admitted her, was called to see the patient  and felt that the symptoms were functional.  Nonetheless, code stroke  was allowed to progress.   I was asked to see the patient determine the nature of her left-sided  weakness and slurred speech and to determine workup and treatment for  this patient.   PAST MEDICAL HISTORY:  Diagnosis of sickle cell in 1983.  A recent  hemoglobin electrophoresis on December 22, 2007 showed hemoglobin 82:5.3,  hemoglobin F 1.9, hemoglobin A 92.8.  This is consistent with beta  thalassemia trait.  The patient had two hospitalizations August 18-21  which showed gastroparesis, peptic ulcer disease, pancreatitis, drug  seeking behavior, coronary artery disease and situational depression.  On the 25th to 27th, the patient was admitted with delirium and  hallucinations.  She again was discharged with a diagnosis of beta  thalassemia trait,  drug-seeking behavior, coronary artery disease,  gastroparesis and delirium with hallucinations.   The patient states that she had two stroke events; one in 2006 that was  associated with stroke and multiple TIAs.  She was hospitalized in  [place] at that time and she says that MRI of the brain showed a spot.  She has a repeat MRI scan in 2008 for similar symptoms.  Symptoms were  always left-sided.   The patient came to the emergency room and was seen August 13 with 10  over 10 chest pain radiating into her neck and left arm, nausea and  vomiting and joint pain and dyspnea on exertion.  MI has been ruled out.  She is noted to be rather comfortable in bed making phone calls to  family members.   PAST SURGICAL HISTORY:  Hysterectomy.   MEDICATIONS:  Prilosec, Celexa, Zyprexa, Lopressor, Plavix, Wellbutrin,  aspirin, Premarin, Lortab and nitroglycerin.   DRUG ALLERGIES:  LATEX PENICILLIN, REGLAN, COMPAZINE, TORADOL, DILAUDID,  ZOFRAN, FENTANYL, ALL OF WHICH CAUSE HIVES.  PENICILLIN, REGLAN AND  FENTANYL ALSO  CAUSE SWELLING OF HER TONGUE.   FAMILY HISTORY:  She has four children.  Her sister and grandmother  apparently had some form of clotting disorder.  We do not know the  details.  There is also history of breast cancer.  Mother died of  myocardial infarction.  History of hypertension and cirrhosis in  siblings.  Her oldest child, apparently by her history, has sickle cell  beta thalassemia.   REVIEW OF SYSTEMS:  A 12 system review is negative except as noted above  for the symptoms discussed previously.   PHYSICAL EXAMINATION:  GENERAL:  On examination today, this is a right-  handed, pleasant woman in no acute distress.  VITAL SIGNS:  Temperature 98.2, blood pressure 114/80 resting pulse 62,  respirations 16, capillary glucose 101, oxygen saturation 100%.  Her  height is 64 inches.  I estimate her weight at 180 pounds, but  I am not  certain.  EAR, NOSE, THROAT:  No  infections.  NECK:  Supple neck.  No bruits.  LUNGS:  Clear.  HEART:  Pulses normal.  She has a systolic ejection murmur at the left  sternal border that radiates up towards the suprasternal notch.  This  may be aortic in nature.  ABDOMEN:  Protuberant.  Bowel sounds normal.  No hepatosplenomegaly.  EXTREMITIES:  Normal without edema or cyanosis.   NEUROLOGIC EXAMINATION:  The patient is awake, alert.  Her dysarthria  was more of a stuttering of her speech where she forced the words out.  She had no dysphasia.  She is able to name objects, repeat phrases and  follow commands.   CRANIAL NERVES:  Round reactive pupils.  Visual fields full to double  simultaneous stimuli, extraocular movements full and conjugate.  Symmetric facial strength, midline tongue and uvula, air conduction  greater than bone conduction bilaterally.   MOTOR EXAMINATION:  The patient had normal functional strength in all  muscle groups tested right and left.  Despite this, she had drift of her  left arm and appeared to have weakness, clumsiness in the left arm.  She  showed fairly good fine motor skills with stereoagnosis.  She is able  oppose her thumb and her fingers better with the right hand than left  hand.   She also drifted down with her left leg, but again showed excellent  strength on formal testing.   Sensory examination split the midline.  This is true for pinprick and  cold.  She also split the midline and sensed tuning fork more prominent  on the right side than the left side.  She had good stereognosis  bilaterally.  She had good proprioception bilaterally.  Deep tendon  reflexes symmetrically diminished.  The patient had bilateral flexor  plantar response.  I did not get her up to walk.  She had good finger-to-  nose and heel-knee-shin on the right.  To the limits of her weakness  place she had good finger-to-nose and good heel-knee-shin on the left  as well.   IMPRESSION:  I believe that this  is functional left-sided weakness.  It  is maybe a hysterical conversion reaction or malingering.  I cannot  tell.  (342.1)   PLAN:  Check MRI, MRA.  If positive, we will complete the workup for  stroke.  If negative, will discontinue other further workup.  I will  discuss this with Dr. Pearlean Brownie who is on-call for our practice today.  She  passed her swallowing screen.  We will sedate  her with Xanax 1 mg  because she has claustrophobia.   LABORATORY STUDIES:  Lipid profile:  Cholesterol 145, triglycerides 165,  HDL 40, LDL 72, VLDL 33.  Creatinine kinase is 61, CK-MB 0.9, troponin  less than 0.01.  Comprehensive metabolic panel:  Sodium 139, potassium  3.9, chloride 108, CO2 25, glucose 128, BUN 12, creatinine 0.65, total  bilirubin 1.0, alkaline phosphatase 61, AST 18, ALT 16, total protein  6.3, albumin 3.6, calcium 8.9.  PTT 28, prothrombin time 13.8, INR 1.0.  CBC  with differential:  White blood cell count 6800, hemoglobin 10.3  hematocrit is 32.0, MCV 72.9, platelet count 279,000.  These are very  consistent with the beta thalassemia trait.  The patient's EKG showed a  prolonged QT interval, a normal sinus rhythm.  No signs of cardiac  ischemia.      Deanna Artis. Sharene Skeans, M.D.  Electronically Signed     WHH/MEDQ  D:  02/10/2008  T:  02/10/2008  Job:  [id number]   cc:   Vania Rea, M.D.   Georga Hacking, M.D.  Fax: [phone]  Email: [email or web address]   Antonietta Breach, M.D.

## 2010-09-12 NOTE — H&P (Signed)
NAMEJAKYIA, GACCIONE              ACCOUNT NO.:  [id number]   MEDICAL RECORD NO.:  [id number]          PATIENT TYPE:  INP   LOCATION:  2912                         FACILITY:  [facility]   PHYSICIAN:  Wendi Snipes, MD DATE OF BIRTH:  August 03, 1965   DATE OF ADMISSION:  05/16/2008  DATE OF DISCHARGE:                              HISTORY & PHYSICAL   PRIMARY CARE PHYSICIAN:  Dr. Yetta Barre in [place], [place].  This  admission to Dr. Reyes Ivan, cardiology.   CHIEF COMPLAINTS:  Chest pain.   HISTORY OF PRESENT ILLNESS:  This is a 45 year old African American  female with a history of coronary disease status post PCI in 2002 and a  history of atypical chest pain and somatoform disorder who presents here  with severe substernal chest pain beginning acutely today.  The patient  reports that her pain began while she was lying in her bed approximately  45 minutes prior to her presentation to the ED at [facility].  She  reports the pain began as a pinching feeling and slowly progressed to  crushing chest pain that is now 10/10 in magnitude.  She states that her  associated symptoms are nausea, vomiting for 2 days prior to her onset  of chest pain.  She also reports mild chest pain after exertion that has  been progressive over the past few months.  She recently moved from  [place] and is here and needs to establish with a primary care  physician.   PAST MEDICAL HISTORY:  1. Coronary artery disease status post PCI in 2002.  2. Somatization disorder.  3. Depression and anxiety.  4. History of drug-seeking behavior.  5. Beta thalassemia trait.   ALLERGIES:  LATEX, PENICILLIN, REGLAN, COMPAZINE, TORADOL, DILAUDID,  ZOFRAN, and FENTANYL.   MEDICATIONS ON ADMISSION:  1. Lopressor 50 mg.  2. Plavix 75 mg.  3. Lortab 10/625 mg.  4. Nexium 40 mg.  5. Wellbutrin 150 mg.  6. Ambien 10 mg.  7. Premarin 0.625 mg.  8. Claritin.  9. Lasix 40 mg.  10.Xanax.  11.Folic acid.  12.Prilosec.  13.Zyprexa.  14.Celexa.  15.Flexeril.   FAMILY HISTORY:  Her mom died of an MI at age 97.   SOCIAL HISTORY:  She smokes 2 to 3 cigarettes per day.  She is  unemployed.  She just moved from [place], [place].  She lives in  [place].   REVIEW OF SYSTEMS:  All 14 systems were reviewed and were negative  except as mentioned in detail in the HPI.   PHYSICAL EXAM:  VITAL SIGNS:  Blood pressure is 120/70, her heart rate  is 90, her respiratory rate 16.  She is satting 96% on 2L nasal cannula.  GENERAL:  She is a 45 year old African American female appearing stated  age in no acute distress.  HEENT:  Moist mucous membranes.  Pupils equal, round, and reactive to  light and accommodation.  Anicteric sclera.  NECK:  No jugular venous distention.  No thyromegaly.  CARDIOVASCULAR:  Regular rate and rhythm.  No murmurs, rubs or gallops.  ABDOMEN:  Nontender, nondistended.  Positive bowel sounds.  No masses.  EXTREMITIES:  No clubbing, cyanosis, or edema.  NEUROLOGIC:  Alert and oriented x3.  Cranial nerves II-XII grossly  intact.  No focal neurologic deficit.  SKIN:  Warm, dry and intact.  No rashes.  PSYCH:  Mood is depressed, affect is flat.   LABORATORY:  On admission the white blood cell count is 8.8, hematocrit  is 32.1.  Her platelets 350,000.  Her potassium is 3.1.  Her creatinine  is 3.7.  Her D-dimer 0.22.  Troponin is less than 0.05.  EKG shows  normal sinus rhythm with no ST or T-wave abnormalities suggestive of  ischemia.   ASSESSMENT/PLAN:  This is a 45 year old female with coronary artery  disease status post PCI in 2002 with atypical chest pain.   No objective evidence of ischemia.  Atypical chest pain.  Her history as  above presents a dilemma.  She is not currently on maximal medical  therapy for existing coronary disease.  She is establishing routine care  here and we will need to acquire her medical records from her PCP.  In  context of her atypical chest pain,  she would likely benefit from  noninvasive risk stratification and, if she rules out and low risk, she  would need a referral for chronic pain management.  Will monitor very  carefully and continue current medical regimen for existing coronary  disease with the addition of a statin medication.  We should check her  for other age appropriate risk factors screening and modification.  We  will replace her potassium and obtain a stat EKG for continued chest  pain.  Otherwise, will continue her nitroglycerin IV infusion and  continue p.r.n. pain medications at this time.  She received up to 12 mg  IV morphine at the outside hospital without much improvement in her  symptoms.      Wendi Snipes, MD  Electronically Signed     BHH/MEDQ  D:  05/16/2008  T:  05/16/2008  Job:  [id number]

## 2010-09-12 NOTE — Discharge Summary (Signed)
NAMERAYETTA, [name]              ACCOUNT NO.:  [id number]   MEDICAL RECORD NO.:  [id number]          PATIENT TYPE:  INP   LOCATION:  3017                         FACILITY:  [facility]   PHYSICIAN:  Lonia Blood, M.D.DATE OF BIRTH:  Sep 23, 1965   DATE OF ADMISSION:  08/31/2008  DATE OF DISCHARGE:  09/05/2008                               DISCHARGE SUMMARY   PRIMARY CARE PHYSICIAN:  Robyn N. Allyne Gee, M.D.   PSYCHIATRIST:  [facility].   DISCHARGE DIAGNOSES:  1. Diffuse pains to include chest, arms, legs, abdomen.      a.     Negative evaluation.      b.     Resolved.  2. Beta thalassemia trait.      a.     The patient DOES NOT have sickle cell disease.      b.     No evidence of hemolysis whatsoever during hospital stay.      c.     Not clinically significant.  3. Transient reported suicidal ideation.      a.     Suicide precautions followed during hospital stay.      b.     Inpatient psychiatric consultation cleared the patient for       discharge home.      c.     Follow up with well established outpatient psychiatric care.  4. Bipolar disorder with major depression and anxiety disorder.  5. Reported chronic pancreatitis.  6. Gastroparesis.  7. Status post hysterectomy.  8. History of colon cancer with Port-A-Cath - details unclear.  9. Status post bilateral tubal ligation.  10.Chronic iron deficiency anemia.  11.Possible history of somatoform disorder.  12.Recent history of Helicobacter pylori infection, status post      treatment within the last 2 weeks.   ALLERGIES:  REPORTED TO LATEX, PENICILLIN REGLAN, DILAUDID, COMPAZINE,  ZOFRAN, TORADOL AND MORPHINE.   DISCHARGE MEDICATIONS:  Please note, discharge medications have not been  changed from the patient's admission regimen.  As a result, no new  prescriptions were given at discharge.  1. Folic acid 1 mg daily.  2. Aspirin 81 mg p.o. daily.  3. Xanax 0.5 mg t.i.d. p.r.n.  None provided at discharge.  4. Plavix 75  mg daily.  5. Pancrease 1 tablet three times a day with meals.  6. Ambien 10 mg q.h.s.  7. Toprol-XL 25 mg daily.  8. Celexa 20 mg daily.  9. Zantac 150 mg b.i.d.  10.Zyprexa 10 mg q.h.s.  11.Wellbutrin XL 150 mg daily.  12.Iron sulfate 325 mg t.i.d.   FOLLOW UP:  1. The patient is advised to follow up with Dr. Dorothyann Peng, her      medical doctor, within 7-10 days for routine medical reevaluation.  2. The patient is advised to follow up at the [facility] as per her      scheduled appointment for this week.  She is advised that no      adjustments will be made in her psychiatric medications by the      inpatient physician group, as it is felt most appropriate to  refer      this to her outpatient consistent psychiatric Kerrick Miler.   PROCEDURE:  None.   CONSULTATIONS:  Inpatient psychiatry.   HOSPITAL COURSE:  [name] is a 45 year old female who  presented to the hospital reporting a history of sickle cell anemia with  diffuse chest pain, arm, legs and abdominal pain.  She was requesting  pain management.  Review of recent records, however, reveal the patient  had recently undergone a hemoglobin electrophoresis which in fact was  most consistent with beta thalassemia trait and was not consistent  whatsoever with sickle cell disease.  This was confirmed with further  studies to evaluate for possible hemolysis during this stay and were all  found to be negative.  The patient was educated on the fact that she  clearly does not have sickle cell disease.  There was significant  concern that the patient's pain might in fact be psychosomatic.  The  patient was informed that titration of pain medications would not be  carried out unless a specific physiologic etiology of the patient's pain  could be identified.  With a negative physical exam and a negative  biochemical evaluation, further workup was not felt to be indicated.  An  H. pylori screen was obtained due to the  patient's complaints of  abdominal plain, and in fact the patient's antibody titer was elevated.  Further history, however, reveals that the patient was recently  diagnosed with H. pylori and apparently has just recently completed a  treatment course.  As a result, one would actually expect that the  patient's H. pylori antibody would remain elevated, and no further  intervention was necessary.  Additionally, the patient was diagnosed  with urinary tract infection.  There was no evidence of severe  pyelonephritis.  The patient has completed a 6 to 7-day course of  antibiotics during her hospital stay and further antibiotics not  indicated at this time.   At such time that a full evaluation was completed and no physiologic  basis for the patient's pain was identified, the patient then began to  report to the nurses that she was hearing voices.  She stated to the  nurses that the voices were telling her to harm herself and that she was  considering suicide.  As a result, the patient was placed on suicide  precautions.  Unfortunately, these symptoms were first noted and  communicated to the medical staff on a Friday.  Inpatient psychiatry  consultation was requested Friday around 2:00 p.m.  Due to a high census  at the [facility], the inpatient psychiatric team was not  able to evaluate the patient until the evening hours of Saturday.  At  that time the patient was then denying suicidal ideation and actually  was reporting that the voice she was hearing was that of her son who  recently committed suicide and that it was in fact comforting to her to  hear his voice.  The attending psychiatrist did not feel that titration  of the patient's psychiatric medications would be indicated.  It should  be noted that the patient made a specific request to the psychiatrist  that she be given escalating doses of Xanax.  The patient was advised  that this would be inappropriate for her  diagnosis.   On Sep 05, 2008, having been cleared by psychiatry, the patient was  deemed to be stable for discharge home.  The patient's vital signs were  stable and she  was afebrile.  She was tolerating full diets and  completely emptying her tray.  No changes are being made in her  medication regimen and no prescriptions are being provided at the time  of her  discharge.  It is stressed to the patient that she should keep her  scheduled followup at the [facility].  She reports that this  appointment is for this week and states that she will be compliant and  follow up.  She is otherwise instructed to follow up with her primary  care physician as discussed above.      Lonia Blood, M.D.  Electronically Signed     JTM/MEDQ  D:  09/05/2008  T:  09/05/2008  Job:  [id number]   cc:   Candyce Churn. Allyne Gee, M.D.  [facility]

## 2010-09-12 NOTE — Discharge Summary (Signed)
NAMEMEMPHIS, CRESWELL              ACCOUNT NO.:  [id number]   MEDICAL RECORD NO.:  [id number]          PATIENT TYPE:  INP   LOCATION:  3032                         FACILITY:  [facility]   PHYSICIAN:  Peggye Pitt, M.D. DATE OF BIRTH:  Jul 11, 1965   DATE OF ADMISSION:  06/19/2008  DATE OF DISCHARGE:  06/22/2008                               DISCHARGE SUMMARY   DISCHARGE DIAGNOSES:  1. Chest pain, ruled out for acute myocardial infarction.  2. Iron-deficiency anemia.  3. Beta thalassemia minor trait.  4. Coronary artery disease, status post percutaneous transluminal      coronary angioplasty in 2002.  5. History of chronic pancreatitis.  6. History of anxiety.  7. History of depression.  8. Reported history of cerebrovascular accident in the past.  9. Documented drug-seeking behavior.  10.Iron-deficiency anemia.  11.Transient left-sided weakness; a code stroke was called.   DISCHARGE MEDICATIONS:  1. Xanax 0.5 mg up to 3 times a day as needed for anxiety.  2. Iron sulfate 325 mg 3 times a day.  3. Aspirin 81 mg daily.  4. Wellbutrin 150 mg daily.  5. Celexa 10 mg daily.  6. Plavix 75 mg daily.  7. Metoprolol 25 mg daily.  8. Lortab 10/650 mg 1 tablet every 6 hours as needed for pain.  9. Omeprazole 20 mg daily.  10.Creon 1 tablet before meals.   DISPOSITION AND FOLLOWUP:  Mrs. Kent is discharged home in stable  condition.  Please note that she has refused all evaluation including 2-  D echoes and carotid Dopplers as well as any further cardiac workup.   CONSULTATIONS ON THIS HOSPITALIZATION:  1. [facility].  2. Noel Christmas, MD of Neurology.   IMAGES AND PROCEDURES PERFORMED DURING THIS HOSPITALIZATION:  Chest x-  ray on June 19, 2008, that showed no acute disease.  A CT scan of  the head without contrast on June 19, 2008, that showed no acute  intracranial abnormalities.   HISTORY AND PHYSICAL EXAMINATION:  For full details,  please refer to  history and physical dictated on June 20, 2008, by Dr. Della Goo.  In brief, Mrs. Apel is a 45 year old African American woman  with multiple medical problems including beta thalassemia minor trait as  well as coronary artery disease and history of drug-seeking behavior,  who presented secondary to complaints of chest pain that she described  as substernal occurring on and off for the past 24 hours.  Initially  stated that her pain radiated up to her jaw and into her left arm.  While in the emergency department, she experienced left transient  weakness and a code stroke was called and Neurology consulted the  patient.  Because of her code stroke and her chest pain, the Hospitalist  Service was called to admit her for further evaluation and management.   HOSPITAL COURSE BY ACTIVE PROBLEM:  1. Chest pain.  She ruled out for an acute myocardial infarction with      3 sets of negative cardiac enzymes as well as with an EKG that      showed no acute  EKG changes.  She did have complaints on and off      chest pain throughout the hospitalization; however, she refused any      interventions including nitroglycerin, stating that only narcotics      would relieve her pain.  On day prior to discharge, because of      continued chest pain, a cardiology consultation was performed in      the name of [facility], who saw the      patient and did not believe that the chest pain was cardiac in      origin.  Today on my interview with the patient, she is refusing      all further cardiac workup and hence, we will discharge the patient      from the hospital.  2. Iron-deficiency anemia.  She had a hemoglobin of 9.5 throughout her      hospitalization.  An anemia panel was drawn that showed a ferritin      level of 5 that is low.  For this reason, we will start her on      ferrous sulfate 325 mg 3 times a day.  3. Transient left-sided weakness.  A  code stroke was called.      Neurology was consulted.  CT scan of the head did not show any      changes, we were unable to obtain an MRI secondary to the patient      being extremely claustrophobic despite use of benzodiazepines.  She      refused further workup including carotid Dopplers and 2-D echoes.   The rest of her chronic medical issues were not a problem this  hospitalization.   VITAL SIGNS ON DAY OF DISCHARGE:  Blood pressure 110/77, heart rate 80,  respirations 19, O2 sats 97% on room air with a temperature of 98.3.   LABORATORIES ON DAY OF DISCHARGE:  Sodium 137, potassium 3.6, chloride  109, bicarb 25, BUN 5, creatinine 0.78 with a glucose of 91.  WBCs 5,  hemoglobin 9 with a platelet count of 263.      Peggye Pitt, M.D.  Electronically Signed     EH/MEDQ  D:  06/22/2008  T:  06/22/2008  Job:  [id number]   cc:   Noel Christmas, MD

## 2010-09-12 NOTE — Consult Note (Signed)
[name], [name]              ACCOUNT NO.:  [id number]   MEDICAL RECORD NO.:  [id number]          PATIENT TYPE:  INP   LOCATION:  1430                         [facility]ACILITY:  [facility]   PHYSICIAN:  Darryl D. Prime, MD    DATE OF BIRTH:  1965-12-14   DATE OF CONSULTATION:  02/10/2008  DATE OF DISCHARGE:                                 CONSULTATION   TIME SEEN:  1:30 a.m. to 2:30 a.m.   REASON FOR CONSULTATION:  Chest pain with a history of coronary artery  disease status post PCI.   HISTORY OF PRESENT ILLNESS:  [name] is a 45 year old female with a  history of coronary disease status post PCI in 2002.  She has a history  of possible stroke.  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  The patient's electrophoresis was  unremarkable for sickle cell trait or disease, however, and she did have  beta thalassemia.  The patient has a history in August 2009 of peptic  ulcer disease, pancreatitis, a history of gastroparesis.  She has a  history of depression with psychotic features after the death of her  son, admitted in August 2009 for this.  The patient presents with chest  pain.  The patient notes the chest pain started about 4:30 p.m. on  February 09, 2008 while sitting, a pressure-like sensation radiating to  he neck and to the left arm, severe, 9/10, that lasted for several  hours.  The patient notes since then the chest pain has been off and on  for several hours, associated with nausea and vomiting.  Emesis has been  significant.  She notes fever to 103, as well over the last few days,  but no cough.  The patient notes dyspnea on exertion that has been  worsening.  The patient in the emergency room was given Phenergan and  morphine.   PAST MEDICAL AND SURGICAL HISTORY:  1. History of MRSA cellulitis.  2. History of pneumonia in March of 2009.  3. History of stroke in 2006 with left-sided weakness.  4. History of anorexia.  5. History  of anxiety disorder not otherwise specified.  6. History of gastroesophageal reflux disease.  7. History of coronary artery disease status post PCI as above.  8. History of colon cancer.  9. History of a partial hysterectomy.  10.History of right subclavian Port-A-Cath.  11.History of possible narcotic dependence.   ALLERGIES:  1. LATEX.  2. PENICILLIN.  3. REGLAN.  4. COMPAZINE.  5. TORADOL.  6. DILAUDID.  7. ZOFRAN.   SOCIAL HISTORY:  The patient is single.  Lives with her fiance.  Is on  disability.  She is a former Astronomer.  She smokes on an average of 2-3  cigarettes a day to half a pack a day since the age of 7.  No alcohol  or illicit drug use.   FAMILY HISTORY:  Positive per patient of sister and grandmother with a  clotting disorder.  Mother had sickle cell disease and father had sickle  cell trait.  Daughter with sickle  cell disease.  She has family history  of colon cancer.   REVIEW OF SYSTEMS:  A 14-point review of systems was negative unless  stated above.  The patient, however, does note diffuse joint pain, as  well, associated with the chest pain.   PHYSICAL EXAMINATION:  VITAL SIGNS:  Blood pressure is 126/69, pulse of  84, respiratory rate of 18, temperature of 97.1.  Her saturations are  95% on room air.  GENERAL:  Chronically ill appearing, sitting upright in no acute  distress.  HEENT:  Normocephalic and atraumatic.  Pupils equal, round and reactive  to light with extraocular movements being intact.  The oropharynx shows  no posterior oropharyngeal lesions.  NECK:  Supple with no lymphadenopathy or thyromegaly.  Jugular venous  distention is 7-8 cm of water.  LUNGS:  Clear to auscultation bilaterally.  CARDIOVASCULAR:  Regular rhythm and rate.  There are no murmurs, rubs,  or gallops.  Normal S1 and S2.  No S3 or S4.  CHEST:  The patient has a Port-A-Cath in place in the right upper chest.  ABDOMEN:  Obese, soft, nontender, nondistended with normoactive  bowel  sounds.  EXTREMITIES:  No clubbing or cyanosis.  She has 1+ bilateral lower  extremity edema.   LABORATORY DATA:  She has a white count of 6.7 with a hemoglobin of  10.8, hematocrit 32.9.  Retic count is normal at 2.5.  The patient's  platelets were 302.  The patient's LFT's were normal.  Sodium 136,  potassium 3.9, chloride 107, bicarbonate 24, BUN 10, creatinine 0.53,  glucose 88.  Cardiac markers at 2330 were negative.  Lipase was 33.  Chest x-ray showed mild peribronchial thickening and right-sided Port-A-  Cath, unchanged from prior chest x-ray, January 22, 2008.  The patient  has an EKG showing normal sinus rhythm, an event rate of 87 beats per  minute, pulse rate interval of 140, QRS 80, QT corrected 464 with no  significant change from prior EKG.   ASSESSMENT AND PLAN:  The patient with a history of coronary artery  disease status post PCI with recurrent chest pain.  She has copious  amounts of emesis in the room of undigested food, which may be related  to her chest pain and gastroparesis, as her chest pain is atypical,  given her history of peptic ulcer disease, as well.  Aspirin and Plavix  is suggested to be continued, as well as Lopressor.  Recommend getting  cardiac markers x3, placing her on telemetry.  Given the patient's  history of peptic ulcer disease, it is recommended she undergo further  risk stratification by stress testing.  Also recommend continuing  aspirin, Plavix and Lopressor, it is suggested that she have cardiac  markers be checked x3, as you are doing, and placing her on telemetry.  Lastly, it is suggested that her LV function be assessed.      Darryl D. Prime, MD  Electronically Signed     DDP/MEDQ  D:  02/10/2008  T:  02/10/2008  Job:  [id number]

## 2010-09-12 NOTE — Consult Note (Signed)
[name], [name]              ACCOUNT NO.:  [id number]   MEDICAL RECORD NO.:  [id number]          PATIENT TYPE:  INP   LOCATION:  2602                         FACILITY:  [facility]   PHYSICIAN:  Noel Christmas, MD    DATE OF BIRTH:  [birth year]   DATE OF CONSULTATION:  06/19/2008  DATE OF DISCHARGE:                                 CONSULTATION   REFERRING PHYSICIAN:  Annye Rusk, MD   REASON FOR CONSULTATION:  Code stroke, slurred speech, and left-sided  weakness.   HISTORY OF PRESENT ILLNESS:  This is a 45 year old African American lady  with sickle cell anemia and beta thalassemia who was in the emergency  room for complaints of pain, primarily chest pain.  Following return  from x-ray, she complained of slurred speech and weakness involving the  left side.  The patient has a history of stroke several years ago, which  also affected the left side.  CT of her head showed no acute changes  including no hemorrhage.  There is also no indication of an old cerebral  infarction on today's study.  She has continued to complain of chest  pain as well as left-sided weakness.  She has been on Plavix 75 mg per  day.  She was complaining of numbness involving the left side as well.   PAST MEDICAL HISTORY:  Remarkable for chronic anxiety, coronary artery  disease, colon carcinoma, depression, old cerebral infarction,  hypertension, sickle cell anemia, and beta thalassemia.   CURRENT MEDICATIONS:  1. Lopressor 50 mg per day.  2. Plavix 75 mg per day.  3. Lortab 10/650 p.r.n.  4. Nexium 40 mg per day.  5. Wellbutrin 150 mg per day.  6. Ambien 10 mg at bed time.  7. Premarin 0.625 mg per day.  8. Claritin-D 24-Hour p.r.n.  9. Lasix 40 mg per day.  10.Xanax.  11.Folic acid.  12.Zyprexa.  13.Celexa.  14.Flexeril.   Doses of lot of medication is unclear.   FAMILY HISTORY:  Positive for coronary artery disease otherwise,  noncontributory.   PHYSICAL EXAMINATION:  GENERAL:   Appearance is that of an obese, fairly  middle aged appearing lady, who was alert and cooperative, and in no  acute distress other than, complaints of pain, primarily chest pain.  She was well oriented to time as well as place.  There was no evidence  of expressive nor receptive language problem.  Affect was somewhat flat.  HEENT:  Pupils were equal, and reactive normally to light.  Extraocular  movements and visual fields were normal.  There was no facial weakness.  The patient had asymmetry of her mouth.  However, this was due to  asymmetry of underlying upper incisors.  Hearing was normal.  Speech was  not dysarthric.  She had somewhat inconsistent hesitancy for speech  output.  However, she has no difficulty with speech when taking on the  phone, which occurred each time she was left alone in a room.  EXTREMITIES:  Motor exam showed poor voluntary effort in evaluating left  arm and left leg.  She had particular difficulty with lifting her  left  arm and left leg against gravity.  Her strength on a horizontal plane  was normal with biceps and triceps testing.  She had poor antagonistic  movement of her right lower extremity with attempting to lift the left  lower extremity.  Muscle tone was normal and symmetrical.  Deep plantar  responses were mute.  Sensory exam showed complete loss of modalities  involving face and left extremities.  There was splitting of the midline  including with perforation of frontal region.  Carotid auscultation was  normal.   CLINICAL IMPRESSION:  1. No objective signs of acute ischemic cerebral event.  2. History of old right cerebral infarction.  However, CT today was      unremarkable with no signs of an old infarct.  3. Significant clinical risk, however, was stroke including sickle      cell anemia and hypertension.   RECOMMENDATIONS:  1. MRI and MRA of the brain without contrast.  2. Carotid Doppler.  3. Echocardiogram.  4. Continue Plavix 75 mg  per day.   Thank you for asking me to evaluate Ms. Fraser Din.      Noel Christmas, MD  Electronically Signed     CS/MEDQ  D:  06/19/2008  T:  06/20/2008  Job:  [id number]

## 2010-09-12 NOTE — Consult Note (Signed)
NAMENOELIA, LENART              ACCOUNT NO.:  [id number]   MEDICAL RECORD NO.:  [id number]          PATIENT TYPE:  INP   LOCATION:  5531                         FACILITY:  [facility]   PHYSICIAN:  Antonietta Breach, M.D.  DATE OF BIRTH:  [birth year]   DATE OF CONSULTATION:  12/19/2007  DATE OF DISCHARGE:                                 CONSULTATION   Ms. Maulding has been leaving the floor.  She acknowledges that she is  leaving the floor in order to find her son who is calling to her.   She has insomnia and also continues with depression with decreased  energy, poor concentration, anhedonia and thoughts of hopelessness.  She  does not have any thoughts of harming herself or others.   She did receive Ativan 2 mg twice just to help calm her feeling on edge,  this has helped some.   REVIEW OF SYSTEMS:  NEUROLOGIC:  No slurring of speech.   LABORATORY DATA:  Sodium 142, potassium 3.9, BUN 8, creatinine 0.78,  glucose 102, WBC 4, hemoglobin 10.3, platelet count 284, SGOT 11, SGPT  15.   EXAMINATION:  VITAL SIGNS:  Temperature 98.1, pulse 85, respiratory rate  20, blood pressure 109/76, O2 saturation on room air 99%.   MENTAL STATUS EXAM:  Ms. Profit is alert.  She has good eye contact.  She is oriented to all spheres, her affect is anxious, her mood is  depressed.  Her memory is intact to immediate recent and remote.  Her  speech is soft with a mildly flat prosody, no dysarthria.  Thought  process is coherent, thought content please see the history of present  illness.  Insight is poor, judgment is impaired.   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  293.83 mood disorder not otherwise specified.  Rule out 296.33 major depressive disorder recurrent severe.  293.84 anxiety disorder not otherwise specified.   DISCUSSION:  Ms. Mermelstein does not have the capacity for self-care due to  her impaired judgment as part of her psychosis, she would be at risk  for  lethal self-neglect until her mental status improves.   RECOMMENDATIONS:  1. Would admit to an inpatient psychiatric unit if her psychosis      continues once medically cleared.  2. Would discontinue the Wellbutrin.  3. Start Celexa 10 mg q.a.m. for anti-depression/antianxiety and      increase to 20 mg q.a.m. in 3 days as tolerated.  4. Would start Zyprexa 5 mg p.o. or IM nightly for anti-psychosis and      augmenting Celexa in anti-depression and antianxiety.  5. Would check a QTC and discontinue Zyprexa if greater than 500      milliseconds.  Also, either a sitter can be utilized for preventing      elopement or the telemetry could be utilized to prevent elopement      (Ms. Fernholz is at risk to elope from the hospital under the      influence of her hallucinations).  6. Discontinue Xanax.  7. Utilize Ativan 1-2 mg p.o. IM or IV q.4 hours p.r.n.  anxiety or      agitation.  8. Ambien 10 mg p.o. nightly p.r.n. insomnia.  9. Low stimulation ego support.      Antonietta Breach, M.D.  Electronically Signed     JW/MEDQ  D:  12/20/2007  T:  12/20/2007  Job:  [id number]

## 2010-09-12 NOTE — H&P (Signed)
[name], [name]              ACCOUNT NO.:  [id number]   MEDICAL RECORD NO.:  [id number]          PATIENT TYPE:  INP   LOCATION:  3741                         FACILITY:  [facility]   PHYSICIAN:  Darryl D. Prime, MD    DATE OF BIRTH:  1965/12/29   DATE OF ADMISSION:  08/31/2008  DATE OF DISCHARGE:                              HISTORY & PHYSICAL   The patient is full code.   PRIMARY CARE PHYSICIAN:  Robyn N. Allyne Gee, M.D.   CHIEF COMPLAINT:  The patient is here for chest pain, pain in the arms  and the legs, abdominal pain, nausea and vomiting.   HISTORY OF PRESENT ILLNESS:  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  She had  an electrophoresis of her hemoglobin performed on August 26, 2008 when  she presented with sickle cell crisis showing no evidence of sickle cell  disease.  It did show signs that she may be heterozygous for beta  thalassemia.  She has a history of cocaine use, cocaine positive on last  admission, August 26, 2008, history of possible drug-seeking behavior,  history of chest pain with possible PCI per patient with stenting, who  presents with chest pain, arm and leg pain, abdominal pain, nausea,  vomiting.  The patient had a cardiac catheterization in March of 2010  showing normal coronary artery disease with no evidence of a stent.  EF  of 60%.  The patient left against medical advice when she was admitted  on last, and notes she did well up until two days ago when she developed  again abdominal pain, nausea and vomiting, unable to keep her  medications down.  The patient notes afterwards developing significant  joint pain diffusely.  The patient within the last 12 hours notes chest  pain radiating to the left arm and left neck.  She took two  nitroglycerin with no relief and decided to come in.  She denies any  diarrhea or fever.  The patient in the emergency room was given  Phenergan, aspirin,  morphine, Benadryl, sublingual nitroglycerin with no  relief of her pain.  She was then admitted.   PAST MEDICAL AND PAST SURGICAL HISTORY:  1. As above.  2. History of pneumonia in March of 2009.  3. History of anxiety.  4. History of depression.  5. Status post hysterectomy.  6. History of gastroparesis.  7. History of chronic pancreatitis.  8. History of stroke.  9. History of iron-deficiency anemia.  10.History of colon cancer per patient, but does not follow up for      this.  11.History of bilateral tubal ligation.  12.History of ectopic pregnancy.  13.History of chest pain as above.   ALLERGIES:  1. LATEX.  2. PENICILLIN.  3. REGLAN.  4. DILAUDID.  5. COMPAZINE.  6. ZOFRAN.  7. TORADOL.  8. MORPHINE (although she does tolerate fentanyl and morphine).   MEDICATIONS:  1. Xanax 0.5 mg to 1 mg three times a day.  2. Aspirin 81 mg daily.  3. Celexa 10 mg daily.  4. Plavix 75 mg daily.  5. Metoprolol in the form of Toprol XL 25 mg daily.  6. Omeprazole 20 mg daily.  7. Creon before meals.  8. Wellbutrin XL 150 mg daily.  9. Iron sulfate 325 mg three times a day.  10.Lortab 10/650 every 6 hours as needed.   SOCIAL HISTORY:  History of tobacco abuse, history of cocaine abuse, but  denies any alcohol use.  She smokes about six cigarettes a day.   FAMILY HISTORY:  Positive for coronary artery disease and hypertension.   REVIEW OF SYSTEMS:  A 14-point review of systems negative unless stated  above.   PHYSICAL EXAMINATION:  VITAL SIGNS:  Temperature is 97.3 with a blood  pressure of 98/58, pulse of 75, respiratory rate of 16, saturations of  98% on room air.  GENERAL:  She is a chronically ill-appearing female sitting upright in  no acute distress.  HEENT:  Normocephalic and atraumatic.  Pupils are equal, round and  reactive to light with extraocular movements being intact with no  scleral icterus.  NECK:  Supple with full range of motion.  No thyromegaly or   lymphadenopathy.  No jugular venous distention.  No carotid bruits.  CARDIOVASCULAR:  Regular rhythm and rate with a 1/6 systolic ejection  murmur.  No gallops or rubs.  LUNGS:  Clear to auscultation bilaterally.  ABDOMEN:  Soft.  There is mild diffuse tenderness to deep palpation.  No  tenderness to gentle palpation.  No signs of rebound.  No splenomegaly.  EXTREMITIES:  No clubbing, cyanosis or edema.  NEUROLOGIC:  She is alert and oriented x4 with cranial nerves II-XII  grossly intact.  Strength and sensation are grossly intact.   LABORATORY DATA:  B type natruretic peptide is less than 30.  Retic  count is normal.  Cardiac markers at 1541 and 1450 were negative.  Lipase was 37.  White count of 6.3 with hemoglobin of 10.7, hematocrit  33.3, platelets of 337, segs of 62, lymphocytes 30.  Sodium is 137 with  potassium of 4, chloride 108, bicarbonate 25, BUN 11, creatinine 0.66,  glucose 83, otherwise normal LFTs.  EKG showed normal sinus rhythm.  Chest x-ray showed no acute cardiopulmonary disease.   ASSESSMENT/PLAN:  This is a patient who notes possible sickle cell  disease and beta thalassemia who presents with diffuse pain.  The  hemoglobin electrophoresis suggests that she does not have sickle cell  disease.  At this time, she will be admitted for hydration, analgesics,  antiemetics and get hematology to help with this.  Will get cardiac  markers x3 and give her aspirin.  Urine drug screen is checked and again  is positive for cocaine.  DVT and GI  prophylaxis will be ordered.  The patient has a urinary tract infection.  On the last visit, left against medical advice before full treatment  could be given.  We will restart ciprofloxacin now.  The patient's urine  culture on August 25, 2008 showed signs of contamination; will get  another urine culture.      Darryl D. Prime, MD  Electronically Signed     DDP/MEDQ  D:  09/01/2008  T:  09/01/2008  Job:  [id number]

## 2010-09-12 NOTE — Consult Note (Signed)
[name], WOS              ACCOUNT NO.:  [id number]   MEDICAL RECORD NO.:  [id number]          PATIENT TYPE:  INP   LOCATION:  3032                         FACILITY:  [facility]   PHYSICIAN:  Nanetta Batty, M.D.   DATE OF BIRTH:  [birth year]   DATE OF CONSULTATION:  06/21/2008  DATE OF DISCHARGE:  06/22/2008                                 CONSULTATION   CHIEF COMPLAINT:  Chest pain.   HISTORY OF PRESENT ILLNESS:  [name] is a 45 year old female who  moved here from [place] in 2009.  She has a history of stent in 2006  at [facility] in [place].  She moved here this summer.  Since  moving here, she has had multiple admissions for different ailments  including chest pain.  Dr. Reyes Ivan had just admitted her in January of  this year for chest pain.  He had arranged for a followup with her as an  outpatient.  Dr. Reyes Ivan told me that the patient had fired him because  he would not prescribe her narcotics.  She was admitted on June 19, 2008, with multiple complaints including chest pain.  Since admission,  she has been noncompliant and has left the floor intermittently.  She  describes substernal chest pain as pressure and also is sharp.   Her past medical history is remarkable in that she has had 2 episodes of  code stroke which in fact are felt to be functional.  The patient says  she has had a previous right brain stroke, but her MRI and CTs were  negative for this.  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  Dr. Reyes Ivan indicates she does have a history of drug-seeking  behavior.  She is status post tubal ligation.   CURRENT MEDICATIONS:  1. Metoprolol 25 mg a day.  2. Plavix 75 mg a day.  3. Aspirin 325 mg a day.  4. Pancrease t.i.d.  5. Wellbutrin 150 mg a day.  6. Celexa 10 mg a day.  7. Xanax 0.5 three times a day.  8. Flexeril 10 mg 3 times a day p.r.n.  9.  Omeprazole 20 mg a day.  10.Folic acid daily.  11.Lortab p.r.n.   She has multiple drug allergies including LATEX, PENICILLIN, REGLAN,  DILAUDID, COMPAZINE, ZOFRAN, TORADOL, MORPHINE, and FENTANYL.   SOCIAL HISTORY:  She is single.  She smokes less than a half pack a day.   REVIEW OF SYSTEMS:  Essentially unremarkable except for noted above,  please refer to complete history and physical done on June 19, 2008.   PHYSICAL EXAMINATION:  VITAL SIGNS:  Blood pressure 98/55, pulse 62, and  temperature 97.3.  GENERAL:  She is an obese [place] American female, somewhat sleepy but  in no acute distress.  HEENT:  Normocephalic.  She wears glasses.  NECK:  Without JVD or bruit.  CHEST:  Clear to auscultation and percussion.  CARDIAC:  Regular rate and rhythm without obvious murmur, rub, or  gallop.  Normal S1 and S2.  ABDOMEN:  Obese and nontender.  EXTREMITIES:  Without edema.  Distal pulses are intact.  NEURO:  Grossly intact.   LABORATORY DATA:  Sodium 138, potassium 3.1, BUN 5, and creatinine 0.83.  White count 4.7, hemoglobin 8.7, hematocrit 26.8, and platelets 248.  TSH 2.2.  EKG shows sinus rhythm without acute changes.  A 2-D  echocardiogram done this admission shows normal LV function with no wall  motion abnormality.  Troponins were negative.   IMPRESSION:  1. Chest pain, question cardiac.  2. Questionable history of coronary artery disease, the patient      refused to sign a release of medical records, so we can investigate      this further.  She did give 2 different dates on 2 different      occasions of when she had her angioplasty, one was in 2002 and      again to me in 2006.  3. 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  4. Drug-seeking behavior.  5. Obesity.  6. Questionable history of previous right brain stroke, MRI and CT      were negative.   PLAN:  The patient was seen by Dr. Allyson Sabal  and myself.  We were going to  work her up based on her data from [facility], but the  patient refused to have her records released.  She was  discharged on  June 22, 2008.      Abelino Derrick, P.A.      Nanetta Batty, M.D.  Electronically Signed    LKK/MEDQ  D:  06/22/2008  T:  06/22/2008  Job:  [id number]

## 2010-09-12 NOTE — H&P (Signed)
[name], [name]              ACCOUNT NO.:  [id number]   MEDICAL RECORD NO.:  [id number]          PATIENT TYPE:  INP   LOCATION:  1430                         FACILITY:  [facility]   PHYSICIAN:  Vania Rea, M.D. DATE OF BIRTH:  1965-09-21   DATE OF ADMISSION:  02/10/2008  DATE OF DISCHARGE:                              HISTORY & PHYSICAL   PRIMARY CARE PHYSICIAN:  Dr. Naida Sleight in [place].   CHIEF COMPLAINT:  Chest pain, nausea and vomiting.   HISTORY OF PRESENT ILLNESS:  This is a 45 year old African American lady  with a self-reported history of coronary artery disease status post  stenting who previously self-reported sickle cell disease, but who was  shown to have only beta thalassemia traits on hemoglobin electrophoresis  at a previous admission.  At that admission, the patient was also  thought to have anxiety, depression and drug seeking behavior.  She is  followed up by Dr. Daiva Huge, pulmonologist, as an outpatient.  She  has not yet followed up with a psychiatrist.  The patient reports that  she was at her baseline until yesterday afternoon when she started  having severe 10/10 chest pain radiating up into her neck and down into  her left arm, unrelieved by sublingual nitirglycerine. She has also been  having nausea and vomiting and pain in all of her joints.  She denies  fever or chills.  She reports a scratchy throat, but no runny nose,  sneezing or cough.  She reports increasing dyspnea on exertion.   At her last admission in August, the patient also had upper endoscopy  which revealed gastroparesis and she was started on Ery-Ped.  However,  the patient denies any knowledge of Ery-Ped and has not been taking it.   While in the emergency room, the patient has been noted to be appearing  very comfortable, checking her cell phone messages until the physician  comes into the room, and then she is suddenly in severe pain.  Nurse  reports that the  patient has also not vomited since being in the  emergency room and has been asking for crackers.  The patient reports  that she has been vomiting.   Patient lives in [place] and reports that she is in [place] staying  with friends over the weekend, which is her reason for coming to this  emergency room.   PAST MEDICAL HISTORY:  1. 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  2. Gastroparesis.  3. Anxiety and depression.  4. History of drug seeking behavior.  5. History of dyspnea on exertion  6. Hysterectomy.   MEDICATIONS:  1. Prilosec 20 mg twice daily.  2. Celexa 20 mg daily.  3. Zyprexa 5 mg bedtime.  4. Lopressor 25 mg twice daily.  5. Plavix 75 mg daily.  6. Wellbutrin 150 mg twice daily.  7. Aspirin 81 mg daily.  8. Premarin unknown dose one daily.  9. Lortab 10 1-2 tablets every 4-6 hours.  10.Nitroglycerin tablets sublingually p.r.n.   ALLERGIES:  LATEX, ALSO ALLERGIES TO PENICILLIN, REGLAN, COMPAZINE,  TORADOL, DILAUDID, ZOFRAN AND FENTANYL.  ALL OF  THESE SUBSTANCES CAUSED  ITCHING AND HIVES.  PENICILLIN, REGLAN AND FENTANYL ALSO CALL SWELLING  OF HER TONGUE.   SOCIAL HISTORY:  She continues to smoke 2-3 cigarettes per day down from  one and a half packs per day.  No alcohol or illicit drug use.  She is  on disability, lives alone.  She has four children.   FAMILY HISTORY:  Significant for sister and grandmother with clotting  disorder and breast cancer.  Mother who died of a heart attack.  She has  six siblings, two of which have hypertension and cirrhosis.  Her oldest  child has by her report sickle beta thalassemia.   REVIEW OF SYSTEMS:  A 10-point review of systems significant for  episodic headaches, scratchiness of her throat, dyspnea on exertion more  recently, and pain without swelling in her knees, ankles, elbows and  shoulders and puffiness of her hands which is the reason she says she  takes Lasix.   PHYSICAL EXAMINATION:  GENERAL:   This is an anxious looking young  African American lady sitting up in the stretcher.  She is really not in  acute distress.  VITALS:  Temperature is 98.1, pulse 85, respirations 17, blood pressure  116/78.  She is saturating 100% on room air.  HEENT:  Pupils are round and equal.  Mucous membranes pink.  Anicteric.  She has no cervical lymphadenopathy or thyromegaly. CHEST:  Clear to  auscultation bilaterally.  CARDIOVASCULAR:  She has a regular rhythm and 3/6 systolic flow murmur.  ABDOMEN:  Obese, soft and nontender.  EXTREMITIES:  Without edema.  She has 2+ pulses bilaterally.  She has  arthritic knees, but there is no warmth or swelling.  There is no warmth  or swelling of her ankles, wrists or elbows.  She has no bony joint  tenderness.  CENTRAL NERVOUS SYSTEM: Cranial nerves II-XII are grossly intact and she  has no focal neurologic deficit.   LABORATORY DATA:  His white count is 6.7, hemoglobin is 10.8.  Her red  cell count is normal at 4.67.  Her MCV is low at 72.6, platelet count is  302, and she has a normal differential on her white count.  Serum  chemistry is completely normal.  Her liver function tests are completely  normal.  Her albumin is 3.9.  Her total bilirubin is 1.0.  Her lipase is  normal at 33.  Her reticulocyte count is completely normal.  Her cardiac  enzymes completely normal with myoglobin of 50 undetectable CK-MB and  undetectable troponins.  Chest x-ray shows no acute cardiopulmonary  disease.  EKG shows normal sinus rhythm with slightly prolonged QT.   ASSESSMENT:  1. Atypical chest pain.  2. History of gastroparesis.  3. Persistent nausea and vomiting without electrolyte derangements.  4. History of anxiety and depression   PLAN:  Will bring this lady on observation for cardiac rule out.  Will  get a copy of her medical records from [facility] in [place],  [place], and will get cardiologist to evaluate her for atypical chest  pain and  coronary artery disease. Have reinforced the importance of  following up with Psychiatrist.   NB: Patients record from [place] did not comfirm Cardiac stent, but it  is not clear records are complete. They do document bipolar disorder and  refusal to perform cardiac stress-testing.      Vania Rea, M.D.  Electronically Signed     LC/MEDQ  D:  02/10/2008  T:  02/10/2008  Job:  [id number]   cc:   Coralyn Helling, MD  [address]  [place], [place] [address]   [place], [place] Dr. Naida Sleight

## 2010-09-12 NOTE — Discharge Summary (Signed)
NAMEKYMBERLEE, VIGER              ACCOUNT NO.:  [id number]   MEDICAL RECORD NO.:  [id number]          PATIENT TYPE:  OBV   LOCATION:  1430                         FACILITY:  [facility]   PHYSICIAN:  Hind I Elsaid, MD      DATE OF BIRTH:  January 13, 1966   DATE OF ADMISSION:  02/10/2008  DATE OF DISCHARGE:  02/12/2008                               DISCHARGE SUMMARY   PRIMARY CARE PHYSICIAN:  Dr. Naida Sleight in [place], [place].   DISCHARGE DIAGNOSES:  1. Atypical chest pain.  2. History of gastroparesis.  3. Anxiety and depression.  4. Somatization disorder.  5. Anxiety disorder.  6. History of drug-seeking behavior.  7. History of beta thalassemia trait.  8. Anemia with low MCV.  Workup to be done as outpatient.  Most likely      secondary to thalassemia trait.   DISCHARGE MEDICATIONS:  1. Prilosec 20 mg twice daily.  2. Celexa 20 mg daily.  3. Zyprexa 5 mg at bedtime.  4. Lopressor 25 mg twice daily.  5. Plavix 75 mg daily.  6. Wellbutrin 150 mg twice daily.  7. Lortab 10/500 mg 1 tab q.4-6 h.  8. Xanax 0.5 mg twice daily to cover 1 week.  9. AeroBid 5 mL twice daily.   CONSULTATIONS:  1. Cardiology consulted.  2. Neurology consulted, as a code stroke was called at the middle of      the night.  3. Dr. Alben Spittle consulted.   BRIEF HISTORY:  This is a 45 year old African American female with self-  reported history of coronary artery disease who is status post a  stenting who previously reported sickle cell disease who was shown to  have only beta thalassemia trait on hemoglobin electrophoresis.  Also,  the patient has a history of anxiety and depression, drug-seeking  behavior.  The patient admitted to the hospital with chest pain  radiating up into her neck and down to her left arm, not relieved by  sublingual nitroglycerin and also admitted nausea, vomiting, and pain in  all of her joints.  Denies any fever.  Admitted for evaluation of her  chest pain.  Patient  admitted to Telemetry.  Cardiac enzymes were  recycled and EKG, which did not show any evidence of active cardiac  issue.  Cardiology did see and evaluate the patient.  As the patient has  history of drug-seeking behavior, they recommend no further inpatient  workup needed, and they recommended outpatient stress test.  On October  13th the patient admitted she complained of left-sided weakness.  Was  evaluated by the hospitalist on call where he found the patient possibly  with acute hysteria and anxiety, and he recommended CT scan of the brain  and proceed with a stroke code.  Dr. Sharene Skeans did evaluate the patient.  Recommend MRI of the brain.  MRI of the brain without acute infarct, and  the patient felt secondary to somatization disorder, and Neurology  signed off.  After that, Dr. Alben Spittle consulted for evaluation of  patient's symptoms and somatization where he recommended somatoform  disorder.  Dr. Jeanie Sewer recommended the patient  to receive Xanax for 1  week, to have an appointment with Psychiatry as outpatient.  Also he  recommended chemical dependency intensive outpatient program at the  local area at [facility], [place], or [facility].  Also, he recommended social worker for psychiatric pharmacotherapy  management appointment as soon as possible, within 1 week of discharge  would also help, and recommended the patient to contact the local  counselor who is trained and capable in managing somatoform disorder  along with cognitive behavior therapy combined with deep breathing and  progressive muscle relaxation.  Accordingly, social worker contacted and  in the process of providing the above services.   1. Nausea and vomiting.  The patient declared nausea and vomiting, but      the patient continued to eat as per nursing evaluation.  The      patient has a history of gastroparesis and will be discharged with      reglan twice daily.  No further recommendation or  workup needed.  2. Anemia, felt could be secondary to thalassemia, and the patient is      to follow this with her primary care physician.  Anyhow, her      hemoglobin remained stable from previous admission.   At this time, we felt the patient is medically stable to be discharged  home.  Follow with her primary care physician as outpatient.  Also need  to make an appointment for a stress test if she has recurrence of her  chest pain.      Hind Bosie Helper, MD  Electronically Signed     HIE/MEDQ  D:  02/12/2008  T:  02/12/2008  Job:  [id number]

## 2010-10-19 DIAGNOSIS — F17200 Nicotine dependence, unspecified, uncomplicated: Secondary | ICD-10-CM | POA: Insufficient documentation

## 2010-10-19 DIAGNOSIS — D5700 Hb-SS disease with crisis, unspecified: Secondary | ICD-10-CM | POA: Insufficient documentation

## 2010-12-11 DIAGNOSIS — D5740 Sickle-cell thalassemia without crisis: Secondary | ICD-10-CM | POA: Insufficient documentation

## 2010-12-11 DIAGNOSIS — Z79899 Other long term (current) drug therapy: Secondary | ICD-10-CM | POA: Insufficient documentation

## 2010-12-27 IMAGING — CR DG ABDOMEN ACUTE W/ 1V CHEST
3 series · 3 of 3 positions shown · non-contrast
Comparison: 09/18/2008

CLINICAL DATA: Chest pain and vomiting. History of sickle cell
disease.  History of hypertension.  History of cardiac stent
placement.

ACUTE ABDOMEN SERIES (ABDOMEN 2 VIEW & CHEST 1 VIEW)

[w chest pa]
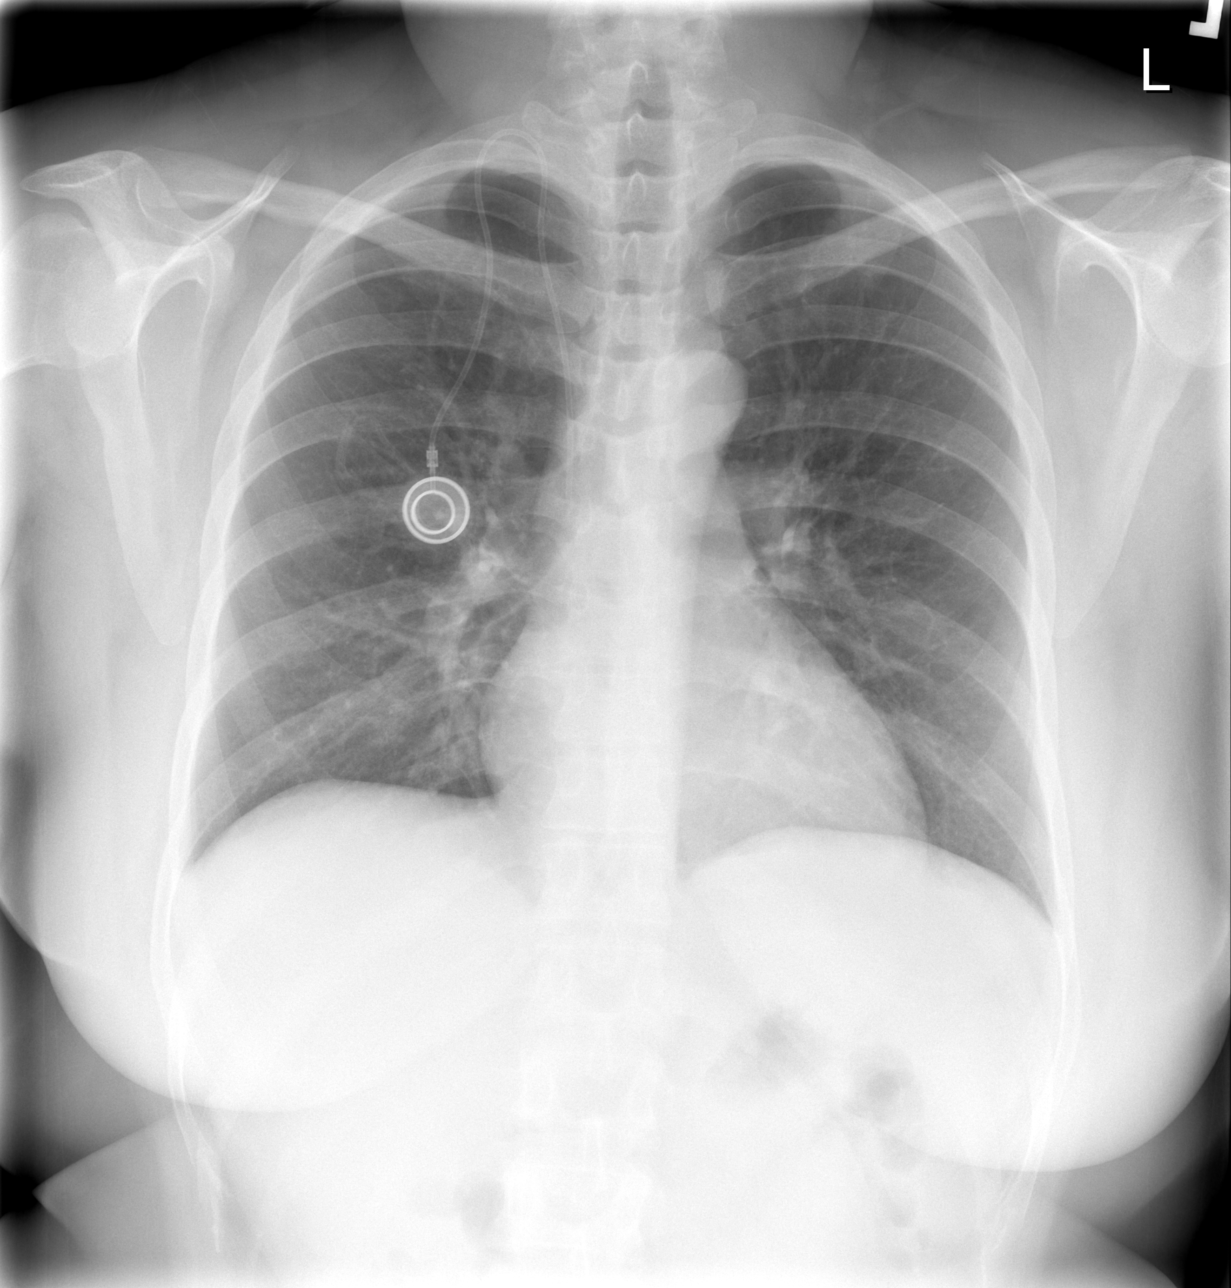

[w abdomen upright]
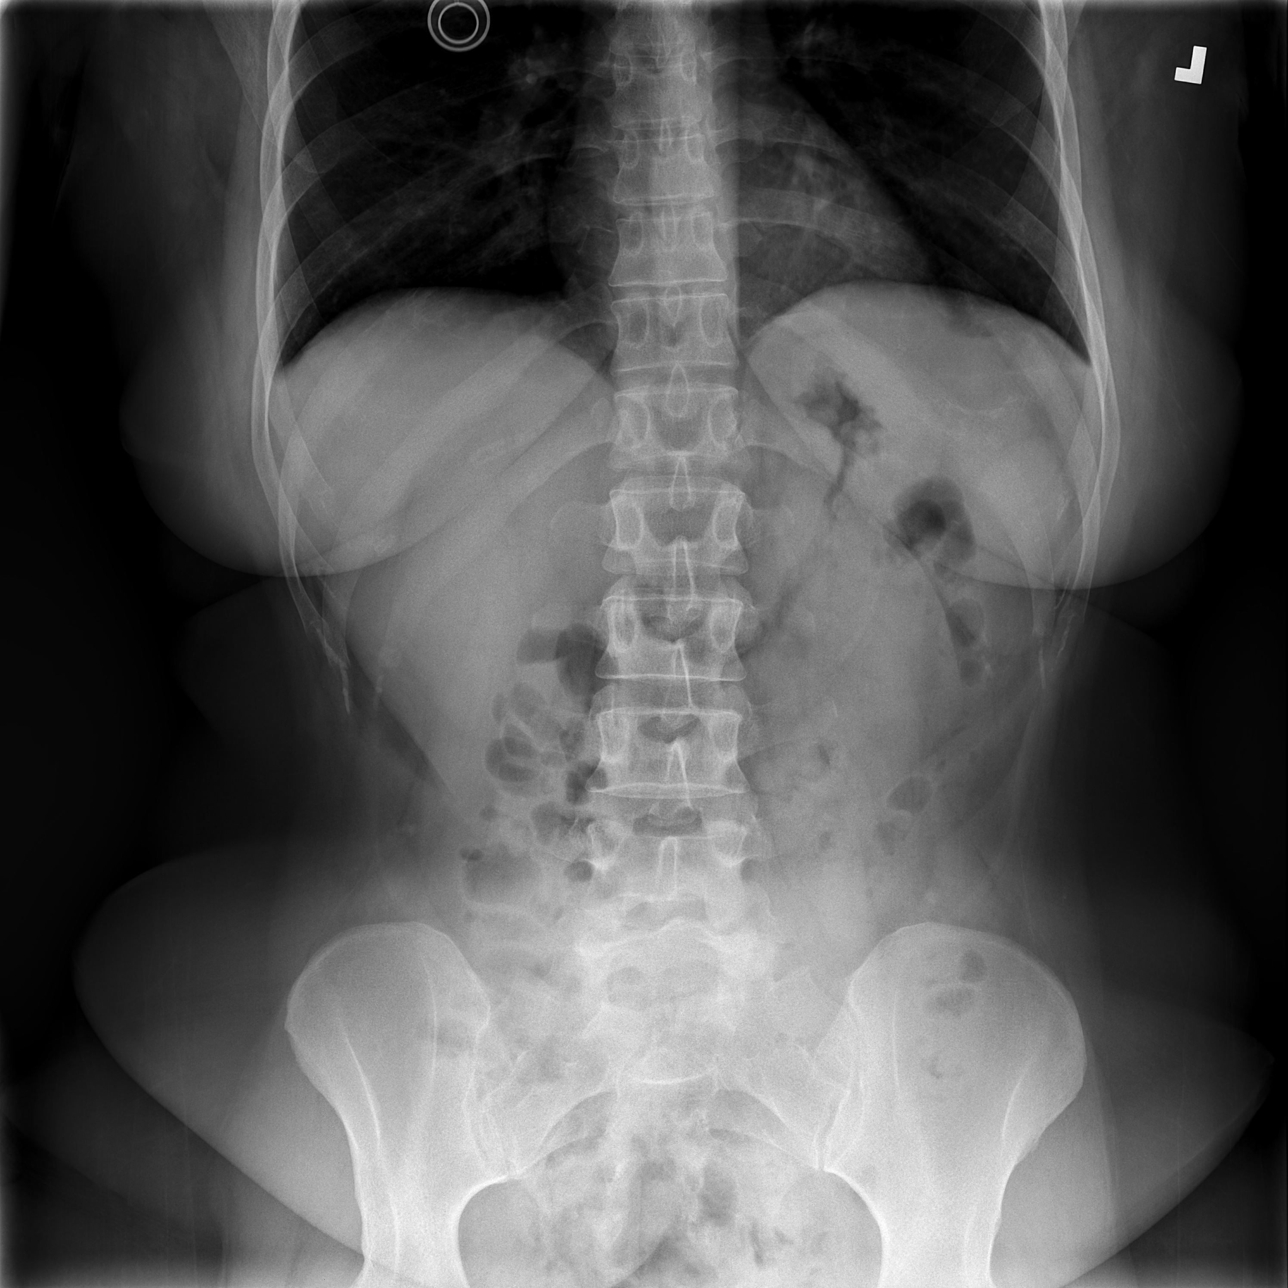

[t abdomen supine]
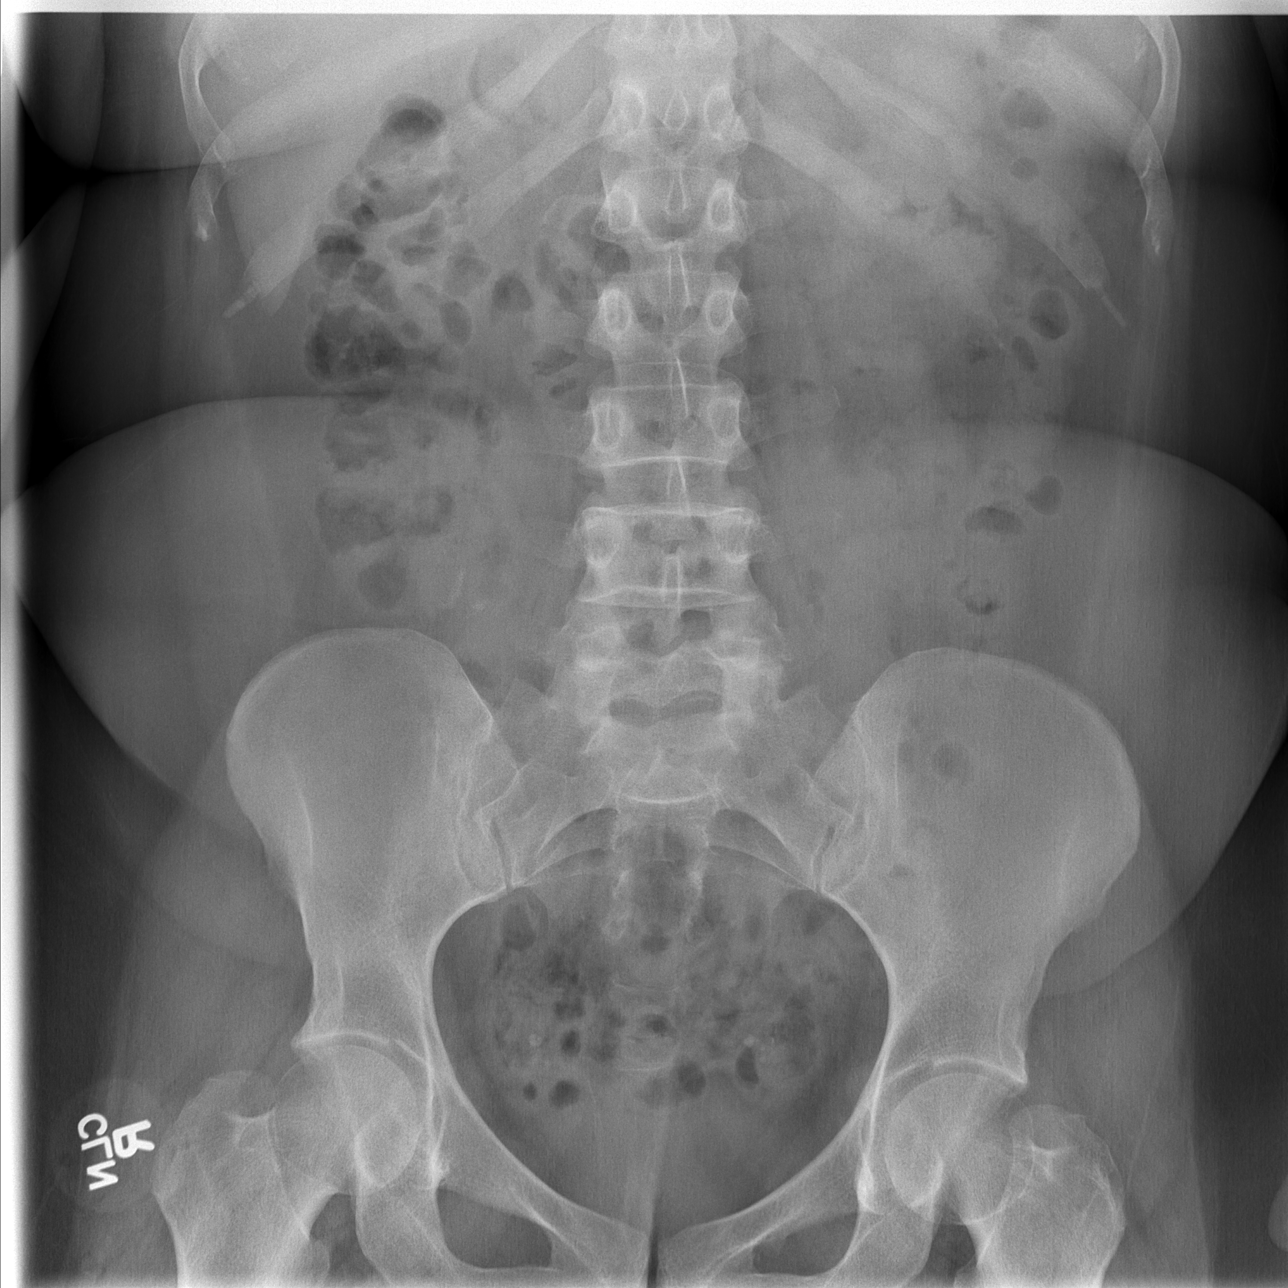

[3 of 3 positions shown; findings below may reference images not displayed]

FINDINGS: Tip of right-sided Port-A-Cath terminates in superior
vena cava.  No pneumothorax is seen. The cardiac silhouette is
normal size and shape. The lungs are well aerated and free of
infiltrates. No pleural abnormality is evident.

There is no pneumoperitoneum is evident.  No opaque calculus is
seen.  Bowel gas pattern is normal.  Phleboliths are present the
pelvis.  No skeletal lesion is seen.
IMPRESSION: Port-A-Cath in place.  No acute or active cardiopulmonary or
abdominal process is identified.  No pneumoperitoneum is seen.

## 2011-01-26 LAB — PREGNANCY, URINE: Preg Test, Ur: NEGATIVE

## 2011-01-26 LAB — URINALYSIS, ROUTINE W REFLEX MICROSCOPIC
Bilirubin Urine: NEGATIVE
Nitrite: NEGATIVE
Specific Gravity, Urine: 1.026
pH: 5.5

## 2011-01-26 LAB — DIFFERENTIAL
Basophils Absolute: 0
Lymphocytes Relative: 32
Monocytes Absolute: 0.6
Neutro Abs: 4.6

## 2011-01-26 LAB — POCT I-STAT, CHEM 8
BUN: 16
Chloride: 105
Creatinine, Ser: 1
Potassium: 3.4 — ABNORMAL LOW
Sodium: 139

## 2011-01-26 LAB — URINE MICROSCOPIC-ADD ON

## 2011-01-26 LAB — CBC
Hemoglobin: 11.6 — ABNORMAL LOW
RDW: 16.4 — ABNORMAL HIGH

## 2011-01-26 LAB — POCT CARDIAC MARKERS
CKMB, poc: <1 — ABNORMAL LOW
Myoglobin, poc: 71.6
Troponin i, poc: <0.05

## 2011-01-26 LAB — RETICULOCYTES: Retic Count, Absolute: 74.6

## 2011-01-29 LAB — URINALYSIS, ROUTINE W REFLEX MICROSCOPIC
Hgb urine dipstick: NEGATIVE
Nitrite: NEGATIVE
Protein, ur: NEGATIVE
Specific Gravity, Urine: 1.024
Urobilinogen, UA: 1

## 2011-01-29 LAB — URINE CULTURE: Colony Count: 25000

## 2011-01-29 LAB — CBC
Hemoglobin: 11 — ABNORMAL LOW
MCV: 72 — ABNORMAL LOW
RBC: 4.97
WBC: 6.9

## 2011-01-29 LAB — DIFFERENTIAL
Basophils Absolute: 0
Eosinophils Absolute: 0
Eosinophils Relative: 1
Neutrophils Relative %: 66

## 2011-01-29 LAB — URINE MICROSCOPIC-ADD ON

## 2011-01-29 LAB — COMPREHENSIVE METABOLIC PANEL
ALT: 17
AST: 20
CO2: 26
Chloride: 107
Creatinine, Ser: 0.67
GFR calc Af Amer: >60
GFR calc non Af Amer: >60
Glucose, Bld: 94
Sodium: 140
Total Bilirubin: 0.8

## 2011-01-29 LAB — SEDIMENTATION RATE: Sed Rate: 19

## 2011-01-30 LAB — CARDIAC PANEL(CRET KIN+CKTOT+MB+TROPI)
CK, MB: 0.8
CK, MB: 0.9
CK, MB: 2
Relative Index: 0.9
Total CK: 220 — ABNORMAL HIGH
Total CK: 61
Troponin I: <0.01

## 2011-01-30 LAB — CBC
HCT: 32 — ABNORMAL LOW
HCT: 33.9 — ABNORMAL LOW
Hemoglobin: 10.3 — ABNORMAL LOW
Hemoglobin: 10.8 — ABNORMAL LOW
MCHC: 31.8
MCHC: 32.2
MCV: 72.6 — ABNORMAL LOW
MCV: 72.9 — ABNORMAL LOW
MCV: 73.4 — ABNORMAL LOW
Platelets: 219
RBC: 3.62 — ABNORMAL LOW
RBC: 4.4
RBC: 4.67
RDW: 16.5 — ABNORMAL HIGH
WBC: 4.1

## 2011-01-30 LAB — COMPREHENSIVE METABOLIC PANEL
ALT: 16
ALT: 17
BUN: 10
BUN: 12
CO2: 24
CO2: 25
Calcium: 8.9
Calcium: 9.1
Creatinine, Ser: 0.53
Creatinine, Ser: 0.65
GFR calc non Af Amer: >60
GFR calc non Af Amer: >60
Glucose, Bld: 128 — ABNORMAL HIGH
Glucose, Bld: 88
Total Protein: 6.3

## 2011-01-30 LAB — DIFFERENTIAL
Eosinophils Absolute: 0.1
Lymphs Abs: 2.4
Neutrophils Relative %: 55

## 2011-01-30 LAB — HOMOCYSTEINE: Homocysteine: 6.2

## 2011-01-30 LAB — BASIC METABOLIC PANEL
BUN: 7
Calcium: 7.3 — ABNORMAL LOW
Chloride: 117 — ABNORMAL HIGH
Creatinine, Ser: 0.61
GFR calc Af Amer: >60
GFR calc non Af Amer: >60

## 2011-01-30 LAB — URINALYSIS, ROUTINE W REFLEX MICROSCOPIC
Bilirubin Urine: NEGATIVE
Nitrite: NEGATIVE
Protein, ur: NEGATIVE
Specific Gravity, Urine: 1.027
Urobilinogen, UA: 1

## 2011-01-30 LAB — LIPID PANEL
Cholesterol: 145
HDL: 41
LDL Cholesterol: 72
LDL Cholesterol: 80
Triglycerides: 133
Triglycerides: 165 — ABNORMAL HIGH

## 2011-01-30 LAB — HEMOGLOBIN A1C: Mean Plasma Glucose: 108

## 2011-01-30 LAB — PROTIME-INR
INR: 1
Prothrombin Time: 13.8

## 2011-01-30 LAB — APTT: aPTT: 28

## 2011-01-30 LAB — POTASSIUM: Potassium: 3.9

## 2011-01-30 LAB — LIPASE, BLOOD: Lipase: 33

## 2011-01-30 LAB — POCT CARDIAC MARKERS
CKMB, poc: <1 — ABNORMAL LOW
Myoglobin, poc: 65.7
Troponin i, poc: <0.05

## 2011-01-30 LAB — RETICULOCYTES
RBC.: 4.67
Retic Count, Absolute: 116.8

## 2011-01-30 LAB — GLUCOSE, CAPILLARY: Glucose-Capillary: 101 — ABNORMAL HIGH

## 2011-01-30 LAB — HCG, SERUM, QUALITATIVE: Preg, Serum: NEGATIVE

## 2011-02-02 LAB — URINALYSIS, ROUTINE W REFLEX MICROSCOPIC
Nitrite: NEGATIVE
Specific Gravity, Urine: 1.026 (ref 1.005–1.030)
Urobilinogen, UA: 1 mg/dL (ref 0.0–1.0)

## 2011-02-02 LAB — CBC
MCHC: 31.8 g/dL (ref 30.0–36.0)
RBC: 4.77 MIL/uL (ref 3.87–5.11)

## 2011-02-02 LAB — COMPREHENSIVE METABOLIC PANEL
ALT: 17 U/L (ref 0–35)
AST: 19 U/L (ref 0–37)
Alkaline Phosphatase: 72 U/L (ref 39–117)
CO2: 23 mEq/L (ref 19–32)
Calcium: 9.4 mg/dL (ref 8.4–10.5)
GFR calc Af Amer: >60 mL/min (ref >60)
GFR calc non Af Amer: >60 mL/min (ref >60)
Potassium: 3.9 mEq/L (ref 3.5–5.1)
Sodium: 136 mEq/L (ref 135–145)
Total Protein: 7.2 g/dL (ref 6.0–8.3)

## 2011-02-02 LAB — POCT CARDIAC MARKERS
CKMB, poc: 1.7 ng/mL (ref 1.0–8.0)
Troponin i, poc: <0.05 ng/mL (ref 0.00–0.09)

## 2011-02-02 LAB — DIFFERENTIAL
Eosinophils Absolute: 0.1 10*3/uL (ref 0.0–0.7)
Eosinophils Relative: 1 % (ref 0–5)
Lymphs Abs: 2 10*3/uL (ref 0.7–4.0)
Monocytes Relative: 8 % (ref 3–12)

## 2011-02-02 LAB — URINE MICROSCOPIC-ADD ON

## 2012-01-31 DIAGNOSIS — G4733 Obstructive sleep apnea (adult) (pediatric): Secondary | ICD-10-CM | POA: Insufficient documentation

## 2012-05-02 DIAGNOSIS — F319 Bipolar disorder, unspecified: Secondary | ICD-10-CM | POA: Insufficient documentation

## 2012-10-04 DIAGNOSIS — R079 Chest pain, unspecified: Secondary | ICD-10-CM

## 2012-10-15 ENCOUNTER — Inpatient Hospital Stay (HOSPITAL_COMMUNITY): Payer: Medicaid - Out of State

## 2012-10-15 ENCOUNTER — Encounter (HOSPITAL_COMMUNITY): Payer: Self-pay | Admitting: Neurology

## 2012-10-15 ENCOUNTER — Inpatient Hospital Stay (HOSPITAL_COMMUNITY)
Admission: EM | Admit: 2012-10-15 | Discharge: 2012-10-17 | DRG: 062 | Disposition: A | Discharge status: Home Health | Payer: Medicaid - Out of State | Source: Other Acute Inpatient Hospital | Attending: Neurology | Admitting: Neurology

## 2012-10-15 ENCOUNTER — Encounter (HOSPITAL_COMMUNITY): Payer: Self-pay | Admitting: Internal Medicine

## 2012-10-15 DIAGNOSIS — I6340 Cerebral infarction due to embolism of unspecified cerebral artery: Principal | ICD-10-CM

## 2012-10-15 DIAGNOSIS — Z8673 Personal history of transient ischemic attack (TIA), and cerebral infarction without residual deficits: Secondary | ICD-10-CM

## 2012-10-15 DIAGNOSIS — D563 Thalassemia minor: Secondary | ICD-10-CM | POA: Diagnosis present

## 2012-10-15 DIAGNOSIS — G8929 Other chronic pain: Secondary | ICD-10-CM | POA: Diagnosis present

## 2012-10-15 DIAGNOSIS — Z87891 Personal history of nicotine dependence: Secondary | ICD-10-CM

## 2012-10-15 DIAGNOSIS — Z79899 Other long term (current) drug therapy: Secondary | ICD-10-CM

## 2012-10-15 DIAGNOSIS — F419 Anxiety disorder, unspecified: Secondary | ICD-10-CM | POA: Insufficient documentation

## 2012-10-15 DIAGNOSIS — E119 Type 2 diabetes mellitus without complications: Secondary | ICD-10-CM | POA: Diagnosis present

## 2012-10-15 DIAGNOSIS — I10 Essential (primary) hypertension: Secondary | ICD-10-CM | POA: Diagnosis present

## 2012-10-15 DIAGNOSIS — R471 Dysarthria and anarthria: Secondary | ICD-10-CM | POA: Diagnosis present

## 2012-10-15 DIAGNOSIS — D568 Other thalassemias: Secondary | ICD-10-CM | POA: Diagnosis present

## 2012-10-15 DIAGNOSIS — F3289 Other specified depressive episodes: Secondary | ICD-10-CM | POA: Diagnosis present

## 2012-10-15 DIAGNOSIS — F450 Somatization disorder: Secondary | ICD-10-CM

## 2012-10-15 DIAGNOSIS — F411 Generalized anxiety disorder: Secondary | ICD-10-CM | POA: Diagnosis present

## 2012-10-15 DIAGNOSIS — R079 Chest pain, unspecified: Secondary | ICD-10-CM

## 2012-10-15 DIAGNOSIS — G8190 Hemiplegia, unspecified affecting unspecified side: Secondary | ICD-10-CM

## 2012-10-15 DIAGNOSIS — Z853 Personal history of malignant neoplasm of breast: Secondary | ICD-10-CM

## 2012-10-15 DIAGNOSIS — F329 Major depressive disorder, single episode, unspecified: Secondary | ICD-10-CM

## 2012-10-15 HISTORY — DX: Acute pancreatitis without necrosis or infection, unspecified: K85.90

## 2012-10-15 HISTORY — DX: Cerebral infarction, unspecified: I63.9

## 2012-10-15 HISTORY — DX: Malignant neoplasm of unspecified site of unspecified female breast: C50.919

## 2012-10-15 LAB — GLUCOSE, CAPILLARY
Glucose-Capillary: 120 mg/dL — ABNORMAL HIGH (ref 70–99)
Glucose-Capillary: 104 mg/dL — ABNORMAL HIGH (ref 70–99)

## 2012-10-15 LAB — MRSA PCR SCREENING: MRSA by PCR: NEGATIVE

## 2012-10-15 MED ORDER — ACETAMINOPHEN 325 MG PO TABS: 650.0000 mg | ORAL_TABLET | ORAL | Status: DC | PRN

## 2012-10-15 MED ORDER — HYDROCODONE-ACETAMINOPHEN 5-325 MG PO TABS
1.0000 | ORAL_TABLET | Freq: Four times a day (QID) | ORAL | Status: DC | PRN
  Administered 2012-10-15 – 2012-10-17 (×5): 1 via ORAL
  Filled 2012-10-15 (×6): qty 1

## 2012-10-15 MED ORDER — SODIUM CHLORIDE 0.9 % IV SOLN
INTRAVENOUS | Status: DC
  Administered 2012-10-16 (×2): via INTRAVENOUS

## 2012-10-15 MED ORDER — PANTOPRAZOLE SODIUM 40 MG IV SOLR
40.0000 mg | Freq: Every day | INTRAVENOUS | Status: DC
  Administered 2012-10-15: 40 mg via INTRAVENOUS
  Filled 2012-10-15 (×2): qty 40

## 2012-10-15 MED ORDER — LORAZEPAM 2 MG/ML IJ SOLN
1.0000 mg | Freq: Once | INTRAMUSCULAR | Status: DC
  Filled 2012-10-15: qty 1

## 2012-10-15 MED ORDER — SENNOSIDES-DOCUSATE SODIUM 8.6-50 MG PO TABS
1.0000 | ORAL_TABLET | Freq: Every evening | ORAL | Status: DC | PRN
  Filled 2012-10-15: qty 1

## 2012-10-15 MED ORDER — LABETALOL HCL 5 MG/ML IV SOLN: 10.0000 mg | INTRAVENOUS | Status: DC | PRN

## 2012-10-15 MED ORDER — OXYCODONE-ACETAMINOPHEN 5-325 MG PO TABS
1.0000 | ORAL_TABLET | ORAL | Status: DC | PRN
  Administered 2012-10-15 – 2012-10-17 (×7): 1 via ORAL
  Filled 2012-10-15 (×7): qty 1

## 2012-10-15 MED ORDER — ACETAMINOPHEN 650 MG RE SUPP: 650.0000 mg | RECTAL | Status: DC | PRN

## 2012-10-15 MED ORDER — STROKE: EARLY STAGES OF RECOVERY BOOK
Freq: Once | Status: AC
  Administered 2012-10-15: 14:00:00
  Filled 2012-10-15: qty 1

## 2012-10-15 MED ORDER — NICOTINE 21 MG/24HR TD PT24
21.0000 mg | MEDICATED_PATCH | Freq: Every day | TRANSDERMAL | Status: DC | PRN
  Administered 2012-10-16: 21 mg via TRANSDERMAL
  Filled 2012-10-15 (×3): qty 1

## 2012-10-15 MED ORDER — MIRTAZAPINE 15 MG PO TABS
15.0000 mg | ORAL_TABLET | Freq: Every evening | ORAL | Status: DC | PRN
Start: 2012-10-15 — End: 2012-10-17
  Filled 2012-10-15 (×2): qty 1

## 2012-10-15 MED ORDER — ALPRAZOLAM 0.5 MG PO TABS
1.0000 mg | ORAL_TABLET | Freq: Every evening | ORAL | Status: DC | PRN
  Administered 2012-10-15: 1 mg via ORAL
  Filled 2012-10-15: qty 1
  Filled 2012-10-15: qty 2

## 2012-10-15 NOTE — Progress Notes (Signed)
UR completed 

## 2012-10-15 NOTE — H&P (Signed)
Admission H&P    Chief Complaint: dysarthria and left arm and leg weakness  HPI: [name] is an 47 y.o. female who awoke this morning feeling fine.  This AM at 08:30 she was going to the mall when she noted her speech was not normal--"slurred".  After about one minute she stated her left face, arm, chest, abdomen and leg became weak and had no sensation. Patient was brought to [facility] ER where Code stroke was called and tPA was initiated.  Patient was transported to [facility] 3100 via Care link.  During this time she noted N/V.  tPA was stopped for fear of possible ICH. On arrival to [facility] ED she was brought straight to CT scanner where head CT showed no bleed or acute stroke.  She was brought up to 3100 where tPA was restarted.  She continues to have left sided numbness and left arm and leg weakness.   Date last known well: 6.18.14 Time last known well: 08:30 tPA Given: Yes NIHSS: 13  Past Medical History  Diagnosis Date  . Somatization disorder   . Depression   . Anxiety   . Beta thalassemia trait   . CVA (cerebral infarction)   . Breast cancer   . Pancreatitis     Past Surgical History  Procedure Laterality Date  . Tubal ligation    . Cesarean section    . Partial hysterectomy    . Cardiac catheterization    . Breast lumpectomy      Family History  Problem Relation Age of Onset  . Diabetes Mother   . Diabetes Father   . Hyperlipidemia Mother   . Hyperlipidemia Father   . Hypertension Mother   . Hypertension Father    Social History:  reports that she quit smoking 4 days ago. Her smoking use included Cigarettes. She smoked 0.50 packs per day. She does not have any smokeless tobacco history on file. She reports that she uses illicit drugs. Her alcohol history is not on file.  Allergies:  Allergies  Allergen Reactions  . Fentanyl     REACTION: fentanyl  . Hydromorphone Hcl   . Iohexol   . Ketorolac Tromethamine   . Latex   . Metoclopramide Hcl   . Ondansetron Hcl    . Penicillins   . Prochlorperazine Edisylate     Current Facility-Administered Medications  Medication Dose Route Frequency Provider Last Rate Last Dose  . 0.9 %  sodium chloride infusion   Intravenous Continuous Ulice Dash, PA-C 75 mL/hr at 10/15/12 1244    . acetaminophen (TYLENOL) tablet 650 mg  650 mg Oral Q4H PRN Ulice Dash, PA-C       Or  . acetaminophen (TYLENOL) suppository 650 mg  650 mg Rectal Q4H PRN Ulice Dash, PA-C      . labetalol (NORMODYNE,TRANDATE) injection 10 mg  10 mg Intravenous Q10 min PRN Ulice Dash, PA-C      . pantoprazole (PROTONIX) injection 40 mg  40 mg Intravenous QHS Ulice Dash, PA-C      . senna-docusate (Senokot-S) tablet 1 tablet  1 tablet Oral QHS PRN Ulice Dash, PA-C       Home medications: Effexor XR 75mg  daily Lasix 40 mg po daily Wellbutrin SR 150 mg daily Ventolin Folic acid Hydroxyurea 500 mg BID Zyprexa Xanax Lisinopril Celexa Zolpidem Zantac Simvastatin Tizanidine Omeprazole Promethazine Amlodipine Doxepin Mirtazapine Clindamycin   ROS: History obtained from the patient  General ROS: negative for - chills, fatigue,  fever, night sweats, weight gain or weight loss Psychological ROS: negative for - behavioral disorder, hallucinations, memory difficulties, mood swings or suicidal ideation Ophthalmic ROS: negative for - blurry vision, double vision, eye pain or loss of vision ENT ROS: negative for - epistaxis, nasal discharge, oral lesions, sore throat, tinnitus or vertigo Allergy and Immunology ROS: negative for - hives or itchy/watery eyes Hematological and Lymphatic ROS: negative for - bleeding problems, bruising or swollen lymph nodes Endocrine ROS: negative for - galactorrhea, hair pattern changes, polydipsia/polyuria or temperature intolerance Respiratory ROS: negative for - cough, hemoptysis, shortness of breath or wheezing Cardiovascular ROS: negative for - chest pain, dyspnea on exertion, edema or  irregular heartbeat Gastrointestinal ROS: negative for - abdominal pain, diarrhea, hematemesis, nausea/vomiting or stool incontinence Genito-Urinary ROS: negative for - dysuria, hematuria, incontinence or urinary frequency/urgency Musculoskeletal ROS: negative for - joint swelling or muscular weakness Neurological ROS: as noted in HPI Dermatological ROS: negative for rash and skin lesion changes    Physical Examination: Blood pressure 112/61, pulse 93, temperature 97.9 F (36.6 C), temperature source Oral, resp. rate 20, height 5\' 4"  (1.626 m), weight 98.9 kg (218 lb 0.6 oz), SpO2 100.00%.  HEENT-  Normocephalic, no lesions, without obvious abnormality.  Normal external eye and conjunctiva.  Normal TM's bilaterally.  Normal auditory canals and external ears. Normal external nose, mucus membranes and septum.  Normal pharynx. Neck supple with no masses, nodes, nodules or enlargement. Cardiovascular - S1, S2 normal Lungs - chest clear, no wheezing, rales, normal symmetric air entry, Heart exam - S1, S2 normal, no murmur, no gallop, rate regular Abdomen - soft, non-tender; bowel sounds normal; no masses,  no organomegaly Extremities - no edema, no skin discoloration, no clubbing and no cyanosis  Neurologic Examination: Mental Status: Alert, oriented, thought content appropriate.  Speech slow and slightly dysarthric without evidence of aphasia.  Able to follow 3 step commands without difficulty. Cranial Nerves: II: Discs flat bilaterally; Visual fields grossly normal, pupils equal, round, reactive to light and accommodation III,IV, VI: ptosis not present, extra-ocular motions intact bilaterally V,VII: smile symmetric, facial light touch sensation decreased from midline to left of face--splits midline to tuning fork and PP VIII: hearing normal bilaterally IX,X: gag reflex present XI: bilateral shoulder shrug XII: midline tongue extension Motor: Right : Upper extremity   5/5    Left:      Upper extremity   note  Lower extremity   5/5     Lower extremity   Note Left arm:  Patient shows no movement with arm when asked to lift off the bed, when left arm is held off the bed and patient asked to push down, she keeps arm elevated antigravity.  Patient shows intermittent ability to bicep flex and tricep extend.   Left leg:--patient shows no movement of left leg.  Positive hoover's sign noted.  Tone and bulk:normal tone throughout; no atrophy noted Sensory: Pinprick and light touch intact on the right. Splits midline from chest to abdomen and states she does not feel her left arm or leg.  No withdrawal from pain on either leg or arm.  Deep Tendon Reflexes: 2+ and symmetric throughout Plantars: Right: downgoing   Left: downgoing Cerebellar: normal finger-to-nose on right,  normal heel-to-shin test on right Gait: not tested due to receiving tPA CV: pulses palpable throughout    No results found for this or any previous visit (from the past 48 hour(s)). Ct Head Wo Contrast  10/15/2012   *RADIOLOGY REPORT*  Clinical  Data: Code stroke started t-PA and developed headache.  CT HEAD WITHOUT CONTRAST  Technique:  Contiguous axial images were obtained from the base of the skull through the vertex without contrast.  Comparison: 10/15/2012 and 07/06/2012.  Findings: No intracranial hemorrhage.  No CT evidence of large acute infarct.  No hydrocephalus.  No intracranial mass lesion detected on this unenhanced exam.  Mastoid air cells, middle ear cavities and visualized sinuses are clear.  IMPRESSION: No intracranial hemorrhage or CT evidence of large acute infarct.   Original Report Authenticated By: Lacy Duverney, M.D.    Felicie Morn PA-C [facility] [phone]  10/15/2012, 12:52 PM   Patient seen and examined.  Clinical course and management discussed.  Necessary edits performed.  I agree with the above.  Assessment and plan of care developed and discussed below.    Assessment: 47  y.o. female presenting to [facility] with complaints of left sided numbness and weakness.  Contraindications reviewed with physician in [facility] ED and none present for tPA.  Patient was within the three hour time window and tPA was administered.  Patient was then transferred to [facility] neuro ICU for further care.  While in transit the patient developed headache, nausea and vomiting.  TPA infusion was discontinued at that time and on arrival to our facility a repeat head CT was performed.  CT was reviewed and shows no evidence of acute hemorrhage.  Infusion was restarted.  Patient now showing some improvement in symptoms.    Stroke Risk Factors - smoking  Plan: 1. HgbA1c, fasting lipid panel 2. MRI, MRA  of the brain without contrast 3. PT consult, OT consult, Speech consult 4. Echocardiogram 5. Carotid dopplers 6. Prophylactic therapy-None 7. Risk factor modification 7. Telemetry monitoring  This patient is critically ill and at significant risk of neurological worsening, death and care requires constant monitoring of vital signs, hemodynamics,respiratory and cardiac monitoring, neurological assessment, discussion with family, other specialists and medical decision making of high complexity. I spent 60 minutes of neurocritical care time  in the care of  this patient.   Thana Farr, MD [facility] [phone]  10/15/2012  2:25 PM

## 2012-10-16 ENCOUNTER — Inpatient Hospital Stay (HOSPITAL_COMMUNITY): Payer: Medicaid - Out of State

## 2012-10-16 ENCOUNTER — Encounter (HOSPITAL_COMMUNITY): Payer: Self-pay | Admitting: *Deleted

## 2012-10-16 DIAGNOSIS — F411 Generalized anxiety disorder: Secondary | ICD-10-CM

## 2012-10-16 DIAGNOSIS — I6350 Cerebral infarction due to unspecified occlusion or stenosis of unspecified cerebral artery: Secondary | ICD-10-CM

## 2012-10-16 LAB — LIPID PANEL
Cholesterol: 118 mg/dL (ref 0–200)
HDL: 45 mg/dL (ref >39)
Total CHOL/HDL Ratio: 2.6 RATIO
Triglycerides: 82 mg/dL (ref <150)

## 2012-10-16 LAB — HEMOGLOBIN A1C
Hgb A1c MFr Bld: 5.7 % — ABNORMAL HIGH (ref <5.7)
Mean Plasma Glucose: 117 mg/dL — ABNORMAL HIGH (ref <117)

## 2012-10-16 LAB — GLUCOSE, CAPILLARY: Glucose-Capillary: 156 mg/dL — ABNORMAL HIGH (ref 70–99)

## 2012-10-16 MED ORDER — LORAZEPAM 2 MG/ML IJ SOLN
1.0000 mg | Freq: Once | INTRAMUSCULAR | Status: AC
  Administered 2012-10-16: 1 mg via INTRAVENOUS

## 2012-10-16 MED ORDER — ZOLPIDEM TARTRATE 5 MG PO TABS
5.0000 mg | ORAL_TABLET | Freq: Every evening | ORAL | Status: DC | PRN
  Administered 2012-10-16: 5 mg via ORAL
  Filled 2012-10-16: qty 1

## 2012-10-16 MED ORDER — DOCUSATE SODIUM 100 MG PO CAPS
100.0000 mg | ORAL_CAPSULE | Freq: Every day | ORAL | Status: DC
  Administered 2012-10-16 – 2012-10-17 (×2): 100 mg via ORAL
  Filled 2012-10-16 (×2): qty 1

## 2012-10-16 MED ORDER — CLOPIDOGREL BISULFATE 75 MG PO TABS
75.0000 mg | ORAL_TABLET | Freq: Every day | ORAL | Status: DC
  Administered 2012-10-16 – 2012-10-17 (×2): 75 mg via ORAL
  Filled 2012-10-16 (×3): qty 1

## 2012-10-16 MED ORDER — VENLAFAXINE HCL ER 75 MG PO CP24
75.0000 mg | ORAL_CAPSULE | Freq: Every day | ORAL | Status: DC
  Administered 2012-10-16 – 2012-10-17 (×2): 75 mg via ORAL
  Filled 2012-10-16 (×2): qty 1

## 2012-10-16 MED ORDER — FUROSEMIDE 80 MG PO TABS
80.0000 mg | ORAL_TABLET | Freq: Every day | ORAL | Status: DC
  Administered 2012-10-16 – 2012-10-17 (×2): 80 mg via ORAL
  Filled 2012-10-16 (×2): qty 1

## 2012-10-16 MED ORDER — PANTOPRAZOLE SODIUM 40 MG PO TBEC
40.0000 mg | DELAYED_RELEASE_TABLET | Freq: Every day | ORAL | Status: DC
  Administered 2012-10-16 – 2012-10-17 (×2): 40 mg via ORAL
  Filled 2012-10-16 (×2): qty 1

## 2012-10-16 MED ORDER — ALPRAZOLAM 0.5 MG PO TABS
1.0000 mg | ORAL_TABLET | Freq: Two times a day (BID) | ORAL | Status: DC | PRN
  Administered 2012-10-16: 1 mg via ORAL
  Filled 2012-10-16: qty 1

## 2012-10-16 MED ORDER — LORAZEPAM 2 MG/ML IJ SOLN
2.0000 mg | Freq: Once | INTRAMUSCULAR | Status: AC
  Administered 2012-10-16: 2 mg via INTRAVENOUS

## 2012-10-16 MED ORDER — ZOLPIDEM TARTRATE 5 MG PO TABS: 10.0000 mg | ORAL_TABLET | Freq: Every evening | ORAL | Status: DC | PRN

## 2012-10-16 MED ORDER — TIZANIDINE HCL 4 MG PO TABS
4.0000 mg | ORAL_TABLET | Freq: Three times a day (TID) | ORAL | Status: DC | PRN
  Administered 2012-10-16: 4 mg via ORAL
  Filled 2012-10-16 (×2): qty 1

## 2012-10-16 MED ORDER — AMLODIPINE BESYLATE 10 MG PO TABS
10.0000 mg | ORAL_TABLET | Freq: Two times a day (BID) | ORAL | Status: DC
  Administered 2012-10-16 – 2012-10-17 (×2): 10 mg via ORAL
  Filled 2012-10-16 (×5): qty 1

## 2012-10-16 MED ORDER — HYDROXYUREA 500 MG PO CAPS
500.0000 mg | ORAL_CAPSULE | Freq: Two times a day (BID) | ORAL | Status: DC
  Administered 2012-10-16 – 2012-10-17 (×3): 500 mg via ORAL
  Filled 2012-10-16 (×5): qty 1

## 2012-10-16 MED ORDER — FOLIC ACID 1 MG PO TABS
1.0000 mg | ORAL_TABLET | Freq: Every day | ORAL | Status: DC
  Administered 2012-10-16 – 2012-10-17 (×2): 1 mg via ORAL
  Filled 2012-10-16 (×2): qty 1

## 2012-10-16 MED ORDER — LISINOPRIL 10 MG PO TABS
10.0000 mg | ORAL_TABLET | Freq: Every day | ORAL | Status: DC
  Administered 2012-10-17: 10 mg via ORAL
  Filled 2012-10-16 (×2): qty 1

## 2012-10-16 NOTE — Evaluation (Signed)
Speech Language Pathology Evaluation Patient Details Name: [name] MRN: [id number] DOB: [birth year] Today's Date: 10/16/2012 Time: 1017-     Problem List:  Patient Active Problem List   Diagnosis Date Noted  . Cerebral embolism with cerebral infarction 10/15/2012  . Hemiplegia, unspecified, affecting nondominant side 10/15/2012  . Anxiety   . Beta thalassemia trait   . ABDOMINAL PAIN-EPIGASTRIC 02/09/2008  . PERSONAL HX COLONIC POLYPS 02/09/2008  . GASTROPARESIS 01/14/2008  . OTHER THALASSEMIA 11/26/2007  . TOBACCO ABUSE 11/26/2007  . HYPERTENSION 11/26/2007  . CAD 11/26/2007  . CELLULITIS AND ABSCESS OF TRUNK 11/26/2007  . DYSPNEA 11/26/2007   Past Medical History:  Past Medical History  Diagnosis Date  . Somatization disorder   . Depression   . Anxiety   . Beta thalassemia trait   . CVA (cerebral infarction)   . Breast cancer   . Pancreatitis    Past Surgical History:  Past Surgical History  Procedure Laterality Date  . Tubal ligation    . Cesarean section    . Partial hysterectomy    . Cardiac catheterization    . Breast lumpectomy     HPI:  47 y.o. who was going to the mall when she noted her speech was not normal with left face, arm, chest, abdomen and leg becoming weak without sensation. Patient was brought to [facility] ER where Code stroke was called and tPA was initiated. Patient was transported to [facility] 3100.  CT showed no bleed or acute stroke.  Pt. given Ativan prior to MRI but unable to perform due to claustrophobia.  PMH:  somatization disorder, depression, snxiety,  CVA, breast CA .  Pt. reported physical deficits following CVA in 2008 on her right side.   Assessment / Plan / Recommendation Clinical Impression  Pt. lethargic during assessment due to Ativan given prior to MRI this a.m.  Pt. demonstrated awareness of difficutly staying awake and stated "Oh, I'm sorry, I can do this."  SHe exhibited min-mild dysarthria, however difficutly to accurately  assess due to sedatives.  She reported that she is "sometimes confused" with this episode.  Very brief evaluation of basic problem solving, orientation and expression/recall of biographical information appeared functional.  ST will continue to assess in diagnostic therapy for higher level speech and cogntivive abilities.    SLP Assessment  Patient needs continued Speech Lanaguage Pathology Services    Follow Up Recommendations  Inpatient Rehab    Frequency and Duration min 2x/week  2 weeks   Pertinent Vitals/Pain none   SLP Goals  SLP Goals Potential to Achieve Goals: Good SLP Goal #1: Pt. will state 2 physical and 2 speech-cognitive deficits with min verbal cues. SLP Goal #2: Pt. will demonstrate appropriate problem solving abilities for moderately complex information with min verbal asssit.  SLP Evaluation Prior Functioning  Cognitive/Linguistic Baseline: Information not available Type of Home: House Lives With:  ("live with people") Vocation: On disability   Cognition  Overall Cognitive Status: Impaired/Different from baseline Arousal/Alertness: Lethargic Orientation Level: Oriented X4 Attention: Sustained Sustained Attention: Impaired Sustained Attention Impairment: Verbal basic Memory:  (TBA) Awareness: Impaired Awareness Impairment: Anticipatory impairment Problem Solving: Impaired Problem Solving Impairment: Functional complex Safety/Judgment:  (TBA)    Comprehension  Auditory Comprehension Overall Auditory Comprehension: Appears within functional limits for tasks assessed Conversation: Simple EffectiveTechniques: Repetition;Extra processing time Visual Recognition/Discrimination Discrimination: Not tested Reading Comprehension Reading Status: Not tested    Expression Expression Primary Mode of Expression: Verbal Verbal Expression Overall Verbal Expression: Appears within functional limits  for tasks assessed Initiation: No impairment Level of  Generative/Spontaneous Verbalization: Sentence Repetition: No impairment Naming: No impairment Interfering Components: Attention Written Expression Written Expression: Not tested   Oral / Motor Oral Motor/Sensory Function Overall Oral Motor/Sensory Function: Appears within functional limits for tasks assessed Motor Speech Overall Motor Speech: Appears within functional limits for tasks assessed Respiration: Within functional limits Phonation: Normal Resonance: Within functional limits Articulation: Within functional limitis Intelligibility: Intelligible Motor Planning: Witnin functional limits   GO     Breck Coons [employer]Ed [employer] [phone]  10/16/2012

## 2012-10-16 NOTE — Progress Notes (Signed)
Pt noted confused and not coherent after taking nap on Vicodin 1 tablet given at 1550. Speech slurred and appears hallucinating.  Had periods of lucidity alternating with confusion. Became suddenly irritable when asking questions.  Able to answer orientation questions appropriately.  Transient loss of dexterity  as evidenced by dropping cell phone,drink, and  Dishes on the floor. Denies taking medications or anything from personal bag. Again, denies suicide ideation. Reached out belongings and requested overhead light be turned on to read bible. Continue to monitor.

## 2012-10-16 NOTE — Progress Notes (Signed)
Pt in MRI awaiting test on table, stating she can not go in due to anxiety after 2mg  of ativan given IVP prior to transport. Annie Main notified and order received for 1mg  ativan IVP to be given. Pt given RX, pt placed in tunnel for approx <77minute and then requested to be removed from MRI tunnel, removed from MRI room and returned to bed. Dr Anne Hahn notified, orders given to take pt for 24hr head CT. Will continue to monitor.

## 2012-10-16 NOTE — Progress Notes (Signed)
Pt. Noted to be increasingly confused.  Refusing to answer questions.  Speech is slurred.  B/P 99/60/  P=86.  Patient's primary nurse notified.  Pt. Had had Vicodin previous to this event.  Continue to monitor.  Primary nurse completed neuro assessment.

## 2012-10-16 NOTE — Progress Notes (Signed)
VASCULAR LAB PRELIMINARY  PRELIMINARY  PRELIMINARY  PRELIMINARY  Carotid duplex  completed.    Preliminary report:  Bilateral:  Less than 39% ICA stenosis.  Vertebral artery flow is antegrade.      Janylah Belgrave, RVT 10/16/2012, 10:51 AM

## 2012-10-16 NOTE — Progress Notes (Signed)
Stroke Team Progress Note  HISTORY [name] is an 47 y.o. female who awoke this morning feeling fine. This AM at 08:30 she was going to the mall when she noted her speech was not normal--"slurred". After about one minute she stated her left face, arm, chest, abdomen and leg became weak and had no sensation. Patient was brought to [facility] ER where Code stroke was called and tPA was initiated. Patient was transported to [facility] 3100 via Care link. During this time she noted N/V. tPA was stopped for fear of possible ICH. On arrival to [facility] ED she was brought straight to CT scanner where head CT showed no bleed or acute stroke. She was brought up to 3100 where tPA was restarted. She continues to have left sided numbness and left arm and leg weakness.   The patient received TPA ending at 1200 on 10/15/12.  SUBJECTIVE  No family is at bedside. The patient is alert and cooperative. No complaints of headache at this time.  OBJECTIVE Most recent Vital Signs: Filed Vitals:   10/16/12 0400 10/16/12 0500 10/16/12 0600 10/16/12 0700  BP: 107/71 93/60 106/72 96/57  Pulse: 79 83 79 88  Temp: 97.9 F (36.6 C)     TempSrc: Oral     Resp: 21 24 23 22   Height:      Weight:      SpO2: 99% 98% 98% 100%   CBG (last 3)   Recent Labs  10/15/12 1713 10/15/12 2224  GLUCAP 120* 104*    IV Fluid Intake:   . sodium chloride 75 mL/hr at 10/15/12 1500    MEDICATIONS  . LORazepam  1 mg Intravenous Once  . pantoprazole (PROTONIX) IV  40 mg Intravenous QHS   PRN:  acetaminophen, acetaminophen, ALPRAZolam, HYDROcodone-acetaminophen, labetalol, mirtazapine, nicotine, oxyCODONE-acetaminophen, senna-docusate  Diet:  Cardiac thin liquids Activity:  Up with assistance DVT Prophylaxis:  SCD  CLINICALLY SIGNIFICANT STUDIES Basic Metabolic Panel: No results found for this basename: NA, K, CL, CO2, GLUCOSE, BUN, CREATININE, CALCIUM, MG, PHOS,  in the last 168 hours Liver Function Tests: No results found for  this basename: AST, ALT, ALKPHOS, BILITOT, PROT, ALBUMIN,  in the last 168 hours CBC: No results found for this basename: WBC, NEUTROABS, HGB, HCT, MCV, PLT,  in the last 168 hours Coagulation: No results found for this basename: LABPROT, INR,  in the last 168 hours Cardiac Enzymes: No results found for this basename: CKTOTAL, CKMB, CKMBINDEX, TROPONINI,  in the last 168 hours Urinalysis: No results found for this basename: COLORURINE, APPERANCEUR, LABSPEC, PHURINE, GLUCOSEU, HGBUR, BILIRUBINUR, KETONESUR, PROTEINUR, UROBILINOGEN, NITRITE, LEUKOCYTESUR,  in the last 168 hours Lipid Panel    Component Value Date/Time   CHOL 118 10/16/2012 0515   TRIG 82 10/16/2012 0515   HDL 45 10/16/2012 0515   CHOLHDL 2.6 10/16/2012 0515   VLDL 16 10/16/2012 0515   LDLCALC 57 10/16/2012 0515   HgbA1C  Lab Results  Component Value Date   HGBA1C  Value: 5.2 (NOTE) The ADA recommends the following therapeutic goal for glycemic control related to Hgb A1c measurement: Goal of therapy: <6.5 Hgb A1c  Reference: American Diabetes Association: Clinical Practice Recommendations 2010, Diabetes Care, 2010, 33: (Suppl  1). 04/20/2009    Urine Drug Screen:     Component Value Date/Time   LABOPIA NONE DETECTED 05/17/2009 1541   COCAINSCRNUR POSITIVE* 05/17/2009 1541   LABBENZ NONE DETECTED 05/17/2009 1541   AMPHETMU NONE DETECTED 05/17/2009 1541   THCU NONE DETECTED 05/17/2009 1541  LABBARB  Value: NONE DETECTED        DRUG SCREEN FOR MEDICAL PURPOSES ONLY.  IF CONFIRMATION IS NEEDED FOR ANY PURPOSE, NOTIFY LAB WITHIN 5 DAYS.        LOWEST DETECTABLE LIMITS FOR URINE DRUG SCREEN Drug Class       Cutoff (ng/mL) Amphetamine      1000 Barbiturate      200 Benzodiazepine   200 Tricyclics       300 Opiates          300 Cocaine          300 THC              50 05/17/2009 1541    Alcohol Level: No results found for this basename: ETH,  in the last 168 hours  Dg Chest 2 View  10/16/2012   *RADIOLOGY REPORT*  Clinical Data: Stroke.   Left-sided chest pain  CHEST - 2 VIEW  Comparison: Chest radiograph 06/08 01/2013  Findings: Right IJ Port-A-Cath remains in stable position with the distal tip in the mid superior vena cava.  Heart and mediastinal contours are stable, with borderline cardiomegaly.  Lung volumes are low.  No focal airspace opacities, effusions, or pneumothorax.  IMPRESSION: Low lung volumes.  No acute findings identified.   Original Report Authenticated By: Britta Mccreedy, M.D.   Ct Head Wo Contrast  10/15/2012   *RADIOLOGY REPORT*  Clinical Data: Code stroke started t-PA and developed headache.  CT HEAD WITHOUT CONTRAST  Technique:  Contiguous axial images were obtained from the base of the skull through the vertex without contrast.  Comparison: 10/15/2012 and 07/06/2012.  Findings: No intracranial hemorrhage.  No CT evidence of large acute infarct.  No hydrocephalus.  No intracranial mass lesion detected on this unenhanced exam.  Mastoid air cells, middle ear cavities and visualized sinuses are clear.  IMPRESSION: No intracranial hemorrhage or CT evidence of large acute infarct.   Original Report Authenticated By: Lacy Duverney, M.D.    CT of the brain   IMPRESSION:  No intracranial hemorrhage or CT evidence of large acute infarct.  MRI of the brain    MRA of the brain    2D Echocardiogram    Carotid Doppler    CXR   IMPRESSION:  Low lung volumes. No acute findings identified.  EKG    Therapy Recommendations pending  Physical Exam  General: The patient is alert and cooperative at the time of the examination.   respiratory: Lungs fields are clear  Abdomen: Positive bowel sounds, nontender  Skin: No significant peripheral edema is noted.   Neurologic Exam  Cranial nerves: Facial symmetry is present. Speech is normal, no aphasia or dysarthria is noted. Extraocular movements are full. Visual fields are full.  Motor: The patient has good strength in all 4 extremities, with the exception that there  is 4/5 weakness of the left lower extremity, some decrease in pinprick sensation of the left leg, otherwise unremarkable.  Coordination: The patient has good finger-nose-finger and heel-to-shin bilaterally, with exception of the use of the left leg.  Gait and station: The gait was not tested. No drift is seen.  Reflexes: Deep tendon reflexes are symmetric. Toes are downgoing bilaterally.    ASSESSMENT Ms. TALI CLEAVES is a 46 y.o. female presenting with a left hemiparesis. The patient received TPA, and was transferred from [facility] hospital to [facility]. CT scan of the [facility] was un[facility]able. The patient has had improvement in her  deficit, with some residual left leg weakness. The patient indicates that she has had episodes of left hemiparesis on multiple occasions in the past, the last episode was 4 months ago.  The patient was on Plavix prior to admission. The patient has a significant psychiatric history.   Left hemiparesis, rule out stroke  History of Somatization disorder  Beta thalassemia  Breast cancer  History of pancreatitis  This patient reports a history of multiple episodes in the past a left hemiparesis. There is a possibility that this current episode could be psychogenic. The patient has a chronic pain disorder, and she is on multiple opiate medications  And other psychoactive medications prior to this admission. The patient was on Plavix prior to admission.   Hospital day # 1  TREATMENT/PLAN  MRI of the brain   MRA of the head   Carotid Doppler study    2-D echocardiogram  Reconvert to Plavix therapy after 12 noon today if MRI is unremarkable  Physical and occupational therapy evaluation  Will follow clinical course  Urine drug screen, history of cocaine use  Lipid panel  Cathaleen Korol KEITH  10/16/2012 8:03 AM

## 2012-10-16 NOTE — Progress Notes (Signed)
Pt has lost her cell phone & believes it was lost in the covers when the bed was changed. Pt is irritated and refusing everything (telemetry monitoring & porta cath infusing). Pt has disconnected her porta cath from the infusion pump. MD & CCMD notified. Per MD Roseanne Reno) leave the cardiac monitor as well as the porta cath disconnected. Will continue to monitor the pt. Sanda Linger

## 2012-10-16 NOTE — Progress Notes (Signed)
Physical Therapy Evaluation Patient Details Name: [name] MRN: [id number] DOB: 1965-09-30 Today's Date: 10/16/2012 Time: 4098-1191 PT Time Calculation (min): 21 min  PT Assessment / Plan / Recommendation Clinical Impression  Pt s/p work up for CVA.  CT negative and could not do MRI.  Pt with inconsistent assessment as pt able to perform activities at times and other times, unable to perform.  Will benefit from PT to address balance and endurance issues.  If 24 hour caregiver available as per pt, should be able to go home with HHPT f/u.      PT Assessment  Patient needs continued PT services    Follow Up Recommendations  Home health PT;Supervision/Assistance - 24 hour                Equipment Recommendations  None recommended by PT         Frequency Min 3X/week    Precautions / Restrictions Precautions Precautions: Fall Restrictions Weight Bearing Restrictions: No   Pertinent Vitals/Pain VSS, no pain      Mobility  Bed Mobility Bed Mobility: Rolling Left;Left Sidelying to Sit;Sitting - Scoot to Edge of Bed Rolling Left: 4: Min assist Left Sidelying to Sit: 4: Min assist Sitting - Scoot to Edge of Bed: 4: Min assist Details for Bed Mobility Assistance: Pt did well getting to EOB.  At times, appeared to not use left UE and other times, using left UE.  Able to scoot out to EOB and then said she needed assist therefore PT assisted her.  Sat EOB without assist.  Transfers Transfers: Sit to Stand;Stand to Sit;Stand Pivot Transfers Sit to Stand: 1: +2 Total assist;With upper extremity assist;From bed Sit to Stand: Patient Percentage: 60% Stand to Sit: 1: +2 Total assist;With armrests;To chair/3-in-1;With upper extremity assist Stand to Sit: Patient Percentage: 60% Stand Pivot Transfers: 1: +2 Total assist Stand Pivot Transfers: Patient Percentage: 60% Details for Transfer Assistance: Pt needed cues for hand placement.  Took pt inct time to achieve balance initially  with pt having HHA of 2 persons.  Initially pt with all weight on her heels bil but did have the ability to pull herself forward and get her balance.  Upon transfer to chair, pt stated she could not move her left LE however pt externally rotated her foot and she was maintaining her balance.  PT assisted by facilitating movement of left LE and pt "dragged" foot forward and then took a step with the right foot.  After that step pt reached for the recliner and was then able to bring left LE rest of way around and sat abruptly in chair.  Pt had the postural stability to maintain trunk control and to weight bear bil LEs and stand tall.  Again, question the inconsistencies in this assessment as pt seemed to be able to do more at times than others.   Ambulation/Gait Ambulation/Gait Assistance: Not tested (comment) Stairs: No Wheelchair Mobility Wheelchair Mobility: No Modified Rankin (Stroke Patients Only) Pre-Morbid Rankin Score: Moderately severe disability Modified Rankin: Severe disability         PT Diagnosis: Generalized weakness  PT Problem List: Decreased activity tolerance;Decreased balance;Decreased mobility;Decreased knowledge of use of DME;Decreased safety awareness;Decreased knowledge of precautions PT Treatment Interventions: DME instruction;Gait training;Stair training;Functional mobility training;Therapeutic activities;Balance training;Therapeutic exercise;Patient/family education   PT Goals Acute Rehab PT Goals PT Goal Formulation: With patient Time For Goal Achievement: 10/30/12 Potential to Achieve Goals: Good Pt will go Supine/Side to Sit: Independently PT Goal: Supine/Side to Sit -  Progress: Goal set today Pt will go Sit to Stand: Independently PT Goal: Sit to Stand - Progress: Goal set today Pt will Ambulate: 16 - 50 feet;with modified independence;with least restrictive assistive device PT Goal: Ambulate - Progress: Goal set today Pt will Go Up / Down Stairs: 6-9  stairs;with least restrictive assistive device;with supervision PT Goal: Up/Down Stairs - Progress: Goal set today  Visit Information  Last PT Received On: 10/16/12 Assistance Needed: +2 PT/OT Co-Evaluation/Treatment: Yes    Subjective Data  Subjective: "I feel so weak." Patient Stated Goal: to go home   Prior Functioning  Home Living Lives With:  (caregiver) Available Help at Discharge: Personal care attendant;Available 24 hours/day Type of Home: House Home Access: Stairs to enter [employer] of Steps: 8 Entrance Stairs-Rails: Right;Left;Can reach both Home Layout: One level;Laundry or work area in basement;Able to live on main level with bedroom/bathroom Bathroom Shower/Tub: Network engineer: Paediatric nurse without back;Bedside commode/3-in-1 (rollator) Prior Function Level of Independence: Needs assistance Needs Assistance: Bathing;Dressing Bath: Moderate Dressing: Moderate Able to Take Stairs?: Yes Driving: No Vocation: On disability Dominant Hand: Right    Cognition  Cognition Arousal/Alertness: Awake/alert Behavior During Therapy: WFL for tasks assessed/performed Overall Cognitive Status: Impaired/Different from baseline Area of Impairment: Attention;Memory;Following commands;Safety/judgement;Awareness;Problem solving Current Attention Level: Focused Memory: Decreased short-term memory Following Commands: Follows one step commands consistently Safety/Judgement: Decreased awareness of safety Problem Solving: Requires verbal cues;Slow processing General Comments: Pt with ? slow processing at times.    Extremity/Trunk Assessment Right Lower Extremity Assessment RLE ROM/Strength/Tone: [facility] for tasks assessed Left Lower Extremity Assessment LLE ROM/Strength/Tone: [facility] for tasks assessed;Deficits LLE ROM/Strength/Tone Deficits: Difficult to assess secondary to inconsistent testing results.  At times, pt able to  hold LE against gravity but then when asked to strength test, pt had strength to push against PT but it was slightly  uncoordinated.  However PT was unable to break pt's resistance suggesting that pt has > strength but was not giving full effort.  ?psychogenic   Balance Static Sitting Balance Static Sitting - Balance Support: Bilateral upper extremity supported;Feet supported Static Sitting - Level of Assistance: 5: Stand by assistance Static Sitting - Comment/# of Minutes: 2 Static Standing Balance Static Standing - Balance Support: Bilateral upper extremity supported;During functional activity Static Standing - Level of Assistance: 1: +2 Total assist;Patient percentage (comment) (pt = 60%) Static Standing - Comment/# of Minutes: 2 minutes with bil HHA  End of Session PT - End of Session Equipment Utilized During Treatment: Gait belt;Oxygen Activity Tolerance: Patient limited by fatigue Patient left: in chair;with call bell/phone within reach Nurse Communication: Mobility status;Need for lift equipment       INGOLD,Korynne Dols 10/16/2012, 1:22 PM [facility] Acute Rehabilitation [phone] (pager)

## 2012-10-16 NOTE — Progress Notes (Signed)
Paged Dr. Thad Ranger concerning patient's need for c-pap and home med. No response. Will continue to follow up.

## 2012-10-16 NOTE — Progress Notes (Signed)
Pt expressed to nurse "very depressed" and desire to "talk to someone" if possible. Denies suicide ideation x 2. Dr  Creed Copper paged and notified. Came on the unit to speak with patient. Conversation not disclosed to nurse.

## 2012-10-16 NOTE — Evaluation (Signed)
Occupational Therapy Evaluation Patient Details Name: [name] MRN: [id number] DOB: 10-26-1965 Today's Date: 10/16/2012 Time: 4098-1191 OT Time Calculation (min): 21 min  OT Assessment / Plan / Recommendation Clinical Impression  Pt admitted with left side weakness. Stroke workup underway.  Will continue to follow acutely in order to address below problem list. Recommending HHOT with 24/7 supervision/assist.    OT Assessment  Patient needs continued OT Services    Follow Up Recommendations  Home health OT;Supervision/Assistance - 24 hour    Barriers to Discharge      Equipment Recommendations  None recommended by OT    Recommendations for Other Services    Frequency  Min 3X/week    Precautions / Restrictions Precautions Precautions: Fall Restrictions Weight Bearing Restrictions: No   Pertinent Vitals/Pain See vitals    ADL  Grooming: Performed;Wash/dry face;Set up (using RUE) Where Assessed - Grooming: Unsupported sitting Upper Body Bathing: Simulated;Minimal assistance Where Assessed - Upper Body Bathing: Unsupported sitting Lower Body Bathing: Simulated;Moderate assistance Where Assessed - Lower Body Bathing: Supported sit to stand Upper Body Dressing: Simulated;Minimal assistance Where Assessed - Upper Body Dressing: Unsupported sitting Lower Body Dressing: Performed;Maximal assistance Where Assessed - Lower Body Dressing: Supported sit to stand Toilet Transfer: Simulated;+2 Total assistance Toilet Transfer: Patient Percentage: 60% Statistician Method: Surveyor, minerals:  (bed to chair) Equipment Used: Gait belt Transfers/Ambulation Related to ADLs: +2 total assist pt=60% for SPT bed<>chair.  Pt inconsistent with use of LLE during transfer, at times havind difficulty advancing LLE but other times having no diffiulty putting weight through LLE while advancing RLE. ADL Comments: Pt somewhat inconsistent with use of LUE throughout  session.  Pt using LUE at times for bed mobility but then demo'ing incr difficulty reaching for objects.      OT Diagnosis: Generalized weakness;Paresis;Cognitive deficits  OT Problem List: Decreased strength;Decreased activity tolerance;Impaired balance (sitting and/or standing);Decreased coordination;Impaired UE functional use;Cardiopulmonary status limiting activity;Decreased knowledge of use of DME or AE;Decreased cognition OT Treatment Interventions: Self-care/ADL training;Neuromuscular education;DME and/or AE instruction;Therapeutic activities;Cognitive remediation/compensation;Patient/family education;Balance training   OT Goals Acute Rehab OT Goals OT Goal Formulation: With patient Time For Goal Achievement: 10/30/12 Potential to Achieve Goals: Good ADL Goals Pt Will Perform Grooming: with supervision;Standing at sink (using LUE) ADL Goal: Grooming - Progress: Goal set today Pt Will Perform Upper Body Dressing: with supervision;Sit to stand from chair;Sit to stand from bed ADL Goal: Upper Body Dressing - Progress: Goal set today Pt Will Perform Lower Body Dressing: with min assist;Sit to stand from chair;Sit to stand from bed ADL Goal: Lower Body Dressing - Progress: Goal set today Pt Will Transfer to Toilet: with supervision;Ambulation;with DME;Comfort height toilet ADL Goal: Toilet Transfer - Progress: Goal set today Pt Will Perform Toileting - Clothing Manipulation: with supervision;Standing;Sitting on 3-in-1 or toilet ADL Goal: Toileting - Clothing Manipulation - Progress: Goal set today Pt Will Perform Toileting - Hygiene: with supervision;Sit to stand from 3-in-1/toilet ADL Goal: Toileting - Hygiene - Progress: Goal set today  Visit Information  Last OT Received On: 10/16/12 Assistance Needed: +2 PT/OT Co-Evaluation/Treatment: Yes    Subjective Data      Prior Functioning     Home Living Lives With:  (caregiver) Available Help at Discharge: Personal care  attendant;Available 24 hours/day Type of Home: House Home Access: Stairs to enter [employer] of Steps: 8 Entrance Stairs-Rails: Right;Left;Can reach both Home Layout: One level;Laundry or work area in basement;Able to live on main level with bedroom/bathroom Bathroom Shower/Tub: Tub/shower  unit Bathroom Toilet: Standard Home Adaptive Equipment: Shower chair without back;Bedside commode/3-in-1 (rollator) Prior Function Level of Independence: Needs assistance Needs Assistance: Bathing;Dressing Bath: Moderate Dressing: Moderate Able to Take Stairs?: Yes Driving: No Vocation: On disability Dominant Hand: Right         Vision/Perception Vision - History Baseline Vision: Wears glasses all the time Patient Visual Report: No change from baseline   Cognition  Cognition Arousal/Alertness: Awake/alert Behavior During Therapy: WFL for tasks assessed/performed Overall Cognitive Status: Impaired/Different from baseline Area of Impairment: Attention;Memory;Following commands;Safety/judgement;Awareness;Problem solving Current Attention Level: Focused Memory: Decreased short-term memory Following Commands: Follows one step commands consistently Safety/Judgement: Decreased awareness of safety Problem Solving: Requires verbal cues;Slow processing General Comments: Pt with ? slow processing at times.    Extremity/Trunk Assessment Right Upper Extremity Assessment RUE ROM/Strength/Tone: [facility] for tasks assessed Left Upper Extremity Assessment LUE ROM/Strength/Tone: Deficits LUE ROM/Strength/Tone Deficits: 3+/5 throughout LUE Sensation: Deficits LUE Sensation Deficits: pt reports diminished/different sensation from RUE but still able to demo's ability to detect light touch. LUE Coordination: Deficits LUE Coordination Deficits: decreased gross and fine motor coordination     Mobility Bed Mobility Bed Mobility: Rolling Left;Left Sidelying to Sit;Sitting - Scoot to [place] of  Bed Rolling Left: 4: Min assist Left Sidelying to Sit: 4: Min assist Sitting - Scoot to [place] of Bed: 4: Min assist Details for Bed Mobility Assistance: Pt using LUE to support herself and reach for rail at times,  Transfers Transfers: Sit to Stand;Stand to Sit Sit to Stand: 1: +2 Total assist;With upper extremity assist;From bed Sit to Stand: Patient Percentage: 60% Stand to Sit: 1: +2 Total assist;With armrests;To chair/3-in-1;With upper extremity assist Stand to Sit: Patient Percentage: 60% Details for Transfer Assistance: VCs for hand placement. Bil HHA for balance and steadying with manual and verbal cues for tall upright posture.     Exercise     Balance Balance Balance Assessed: Yes Static Sitting Balance Static Sitting - Balance Support: Bilateral upper extremity supported;Feet supported Static Sitting - Level of Assistance: 5: Stand by assistance Static Sitting - Comment/# of Minutes: 2 Static Standing Balance Static Standing - Balance Support: Bilateral upper extremity supported;During functional activity Static Standing - Level of Assistance: 1: +2 Total assist;Patient percentage (comment) Static Standing - Comment/# of Minutes: 2 min with bil HHA   End of Session OT - End of Session Equipment Utilized During Treatment: Gait belt Activity Tolerance: Patient tolerated treatment well Patient left: in chair;with call bell/phone within reach Nurse Communication: Mobility status  GO   10/16/2012 Cipriano Mile OTR/L Pager [phone] Office [phone]   Cipriano Mile 10/16/2012, 4:13 PM

## 2012-10-16 NOTE — Progress Notes (Signed)
*  PRELIMINARY RESULTS* Echocardiogram 2D Echocardiogram has been performed.  [name] 10/16/2012, 2:37 PM

## 2012-10-16 NOTE — Plan of Care (Signed)
Problem: tPA Day Progression Outcomes-Only if tPA administered Goal: Pre tPA two IV sites established Outcome: Not Applicable Date Met:  10/16/12 Porta cath accessed Goal: Post tPA call MD if neuro decline - plan for STAT CT Outcome: Completed/Met Date Met:  10/16/12 HA and nausea in route Dr Thad Ranger notified

## 2012-10-16 NOTE — Progress Notes (Signed)
Attempted to page Dr. Thad Ranger a second time. Still no response. Will continue to monitor and follow up.

## 2012-10-17 DIAGNOSIS — F329 Major depressive disorder, single episode, unspecified: Secondary | ICD-10-CM

## 2012-10-17 DIAGNOSIS — Z79899 Other long term (current) drug therapy: Secondary | ICD-10-CM

## 2012-10-17 LAB — CBC WITH DIFFERENTIAL/PLATELET
Basophils Absolute: 0 10*3/uL (ref 0.0–0.1)
Lymphs Abs: 2.8 10*3/uL (ref 0.7–4.0)
MCH: 22.6 pg — ABNORMAL LOW (ref 26.0–34.0)
MCHC: 33 g/dL (ref 30.0–36.0)
MCV: 68.7 fL — ABNORMAL LOW (ref 78.0–100.0)
Monocytes Absolute: 0.4 10*3/uL (ref 0.1–1.0)
Platelets: 307 10*3/uL (ref 150–400)
RDW: 15.7 % — ABNORMAL HIGH (ref 11.5–15.5)

## 2012-10-17 LAB — BASIC METABOLIC PANEL
CO2: 27 mEq/L (ref 19–32)
Calcium: 9.1 mg/dL (ref 8.4–10.5)
Creatinine, Ser: 0.63 mg/dL (ref 0.50–1.10)
GFR calc Af Amer: >90 mL/min (ref >90)

## 2012-10-17 LAB — LIPID PANEL
Cholesterol: 128 mg/dL (ref 0–200)
Total CHOL/HDL Ratio: 2.8 RATIO
VLDL: 20 mg/dL (ref 0–40)

## 2012-10-17 LAB — RAPID URINE DRUG SCREEN, HOSP PERFORMED
Cocaine: NOT DETECTED
Opiates: POSITIVE — AB
Tetrahydrocannabinol: NOT DETECTED

## 2012-10-17 LAB — GLUCOSE, CAPILLARY: Glucose-Capillary: 148 mg/dL — ABNORMAL HIGH (ref 70–99)

## 2012-10-17 MED ORDER — SODIUM CHLORIDE 0.9 % IJ SOLN: 10.0000 mL | INTRAMUSCULAR | Status: DC | PRN

## 2012-10-17 MED ORDER — HEPARIN SOD (PORK) LOCK FLUSH 100 UNIT/ML IV SOLN
500.0000 [IU] | INTRAVENOUS | Status: AC | PRN
  Administered 2012-10-17: 500 [IU]

## 2012-10-17 MED ORDER — SODIUM CHLORIDE 0.9 % IJ SOLN: 10.0000 mL | Freq: Two times a day (BID) | INTRAMUSCULAR | Status: DC

## 2012-10-17 NOTE — Discharge Summary (Signed)
Stroke Discharge Summary  Patient ID: [name]   MRN: [id number]      DOB: 02-10-1966  Date of Admission: 10/15/2012 Date of Discharge: 10/17/2012  Attending Physician:  Darcella Cheshire, MD, Stroke MD  Consulting Physician(s):     NONE  Discharge Diagnoses:  Principal Problem:   Cerebral embolism with cerebral infarction Active Problems:   Other thalassemia   HYPERTENSION   Anxiety   Beta thalassemia trait   Hemiplegia, unspecified, affecting nondominant side   Depression with somatization   Encounter for long-term (current) use of other high-risk medications  BMI: Body mass index is 36.78 kg/(m^2).  Past Medical History  Diagnosis Date  . Somatization disorder   . Depression   . Anxiety   . Beta thalassemia trait   . CVA (cerebral infarction)   . Breast cancer   . Pancreatitis    Past Surgical History  Procedure Laterality Date  . Tubal ligation    . Cesarean section    . Partial hysterectomy    . Cardiac catheterization    . Breast lumpectomy        Medication List    STOP taking these medications       ranitidine 150 MG capsule  Commonly known as:  ZANTAC     varenicline 0.5 MG tablet  Commonly known as:  CHANTIX      TAKE these medications       albuterol 108 (90 BASE) MCG/ACT inhaler  Commonly known as:  PROVENTIL HFA;VENTOLIN HFA  Inhale 2 puffs into the lungs every 8 (eight) hours as needed for shortness of breath.     ALPRAZolam 1 MG tablet  Commonly known as:  XANAX  Take 1 mg by mouth at bedtime as needed for anxiety.     amitriptyline 25 MG tablet  Commonly known as:  ELAVIL  Take 25-50 mg by mouth at bedtime as needed (for mood or imsomnia).     amLODipine 10 MG tablet  Commonly known as:  NORVASC  Take 10 mg by mouth 2 (two) times daily.     clarithromycin 500 MG tablet  Commonly known as:  BIAXIN  Take 500 mg by mouth 2 (two) times daily.     clopidogrel 75 MG tablet  Commonly known as:  PLAVIX  Take 75 mg by  mouth daily.     docusate sodium 100 MG capsule  Commonly known as:  COLACE  Take 100 mg by mouth daily.     folic acid 1 MG tablet  Commonly known as:  FOLVITE  Take 1 mg by mouth daily.     furosemide 80 MG tablet  Commonly known as:  LASIX  Take 80 mg by mouth every morning.     hydroxyurea 500 MG capsule  Commonly known as:  HYDREA  Take 500 mg by mouth 2 (two) times daily. May take with food to minimize GI side effects.     lisinopril 10 MG tablet  Commonly known as:  PRINIVIL,ZESTRIL  Take 10 mg by mouth every morning.     metroNIDAZOLE 500 MG tablet  Commonly known as:  FLAGYL  Take 500 mg by mouth 2 (two) times daily. Started 09/29/12, for 14 days, ended 10/12/12.     mirtazapine 15 MG tablet  Commonly known as:  REMERON  Take 15 mg by mouth at bedtime as needed (for mood, appetite and insomnia).     nicotine 21 mg/24hr patch  Commonly known as:  NICODERM CQ -  dosed in mg/24 hours  Place 1 patch onto the skin daily as needed (for nicotine cravings).     nitroGLYCERIN 0.4 MG SL tablet  Commonly known as:  NITROSTAT  Place 0.4 mg under the tongue every 5 (five) minutes as needed for chest pain.     omeprazole 20 MG capsule  Commonly known as:  PRILOSEC  Take 20 mg by mouth 2 (two) times daily.     oxyCODONE 15 MG immediate release tablet  Commonly known as:  ROXICODONE  Take 15 mg by mouth every 8 (eight) hours as needed for pain.     potassium chloride SA 20 MEQ tablet  Commonly known as:  K-DUR,KLOR-CON  Take 20 mEq by mouth daily.     promethazine 25 MG tablet  Commonly known as:  PHENERGAN  Take 25 mg by mouth every 8 (eight) hours as needed for nausea.     tiZANidine 4 MG tablet  Commonly known as:  ZANAFLEX  Take 4 mg by mouth every 8 (eight) hours as needed (for muscle pains/cramps).     venlafaxine XR 75 MG 24 hr capsule  Commonly known as:  EFFEXOR-XR  Take 75 mg by mouth daily.     zolpidem 10 MG tablet  Commonly known as:  AMBIEN  Take 10  mg by mouth at bedtime as needed for sleep.        LABORATORY STUDIES CBC    Component Value Date/Time   WBC 5.5 10/17/2012 0515   RBC 3.93 10/17/2012 0515   HGB 8.9* 10/17/2012 0515   HCT 27.0* 10/17/2012 0515   PLT 307 10/17/2012 0515   MCV 68.7* 10/17/2012 0515   MCH 22.6* 10/17/2012 0515   MCHC 33.0 10/17/2012 0515   RDW 15.7* 10/17/2012 0515   LYMPHSABS 2.8 10/17/2012 0515   MONOABS 0.4 10/17/2012 0515   EOSABS 0.1 10/17/2012 0515   BASOSABS 0.0 10/17/2012 0515   CMP    Component Value Date/Time   NA 138 10/17/2012 0515   K 3.6 10/17/2012 0515   CL 103 10/17/2012 0515   CO2 27 10/17/2012 0515   GLUCOSE 106* 10/17/2012 0515   BUN 9 10/17/2012 0515   CREATININE 0.63 10/17/2012 0515   CALCIUM 9.1 10/17/2012 0515   PROT 7.5 08/09/2009 1730   ALBUMIN 3.9 08/09/2009 1730   AST 15 08/09/2009 1730   ALT 14 08/09/2009 1730   ALKPHOS 53 08/09/2009 1730   BILITOT 0.8 08/09/2009 1730   GFRNONAA >90 10/17/2012 0515   GFRAA >90 10/17/2012 0515   COAGS Lab Results  Component Value Date   INR 1.09 03/09/2009   INR 1.1 09/15/2008   INR 1.1 06/19/2008   Lipid Panel    Component Value Date/Time   CHOL 128 10/17/2012 0515   TRIG 99 10/17/2012 0515   HDL 46 10/17/2012 0515   CHOLHDL 2.8 10/17/2012 0515   VLDL 20 10/17/2012 0515   LDLCALC 62 10/17/2012 0515   HgbA1C  Lab Results  Component Value Date   HGBA1C 5.7* 10/16/2012   Cardiac Panel (last 3 results) No results found for this basename: CKTOTAL, CKMB, TROPONINI, RELINDX,  in the last 72 hours Urinalysis    Component Value Date/Time   COLORURINE YELLOW 08/09/2009 1622   APPEARANCEUR CLEAR 08/09/2009 1622   LABSPEC 1.030 08/09/2009 1622   PHURINE 6.0 08/09/2009 1622   GLUCOSEU NEGATIVE 08/09/2009 1622   HGBUR SMALL* 08/09/2009 1622   BILIRUBINUR SMALL* 08/09/2009 1622   KETONESUR 40* 08/09/2009 1622   PROTEINUR NEGATIVE 08/09/2009  1622   UROBILINOGEN 1.0 08/09/2009 1622   NITRITE NEGATIVE 08/09/2009 1622   LEUKOCYTESUR NEGATIVE 08/09/2009 1622    Urine Drug Screen     Component Value Date/Time   LABOPIA POSITIVE* 10/17/2012 0611   COCAINSCRNUR NONE DETECTED 10/17/2012 0611   LABBENZ POSITIVE* 10/17/2012 0611   AMPHETMU NONE DETECTED 10/17/2012 0611   THCU NONE DETECTED 10/17/2012 0611   LABBARB NONE DETECTED 10/17/2012 0611    Alcohol Level    Component Value Date/Time   ETH  Value: <5        LOWEST DETECTABLE LIMIT FOR SERUM ALCOHOL IS 5 mg/dL FOR MEDICAL PURPOSES ONLY 10/15/2008 1330    SIGNIFICANT DIAGNOSTIC STUDIES Dg Chest 2 View  10/16/2012  Low lung volumes. No acute findings identified. Original Report Authenticated By: Britta Mccreedy, M.D.   Ct Head Wo Contrast  10/16/2012 Negative exam. Original Report Authenticated By: Davonna Belling, M.D.  10/15/2012 No intracranial hemorrhage or CT evidence of large acute infarct. Original Report Authenticated By: Lacy Duverney, M.D.   2D Echocardiogram EF 60%. No cardiac source of embolism identified.   Carotid Doppler The vertebral arteries appear patent with antegrade flow. Findings consistent with less than 39 percent stenosis involving the right internal carotid artery and the left internal carotid artery     History of Present Illness   LOVEAH LIKE is an 47 y.o. female who awoke on date of admission morning feeling fine. At 08:30 she was going to the mall when she noted her speech was not normal--"slurred". After about one minute she stated her left face, arm, chest, abdomen and leg became weak and had no sensation. Patient was brought to [facility] ER where Code stroke was called and tPA was initiated. Patient was transported to [facility] 3100 via Care link. During this time she noted N/V. tPA was stopped for fear of possible ICH. On arrival to [facility] ED she was brought straight to CT scanner where head CT showed no bleed or acute stroke. She was brought up to 3100 where tPA was restarted. She continued to have left sided numbness and left arm and leg weakness.   Date last known well: 6.18.14   Time last known well: 08:30  tPA Given: Yes  NIHSS: 22   Hospital Course   Ms. SABRIYA YONO is a 47 y.o. female presenting with a left hemiparesis. The patient received TPA, and was transferred from [facility] hospital to [facility]. CT [facility]. The patient has had improvement in her deficit, with some residual left leg weakness. The patient indicates that she has had episodes of left hemiparesis on multiple occasions in the past, the last episode was 4 months ago. The patient was on Plavix prior to admission. The patient has a significant psychiatric history.  Left hemiparesis, NIHSS=13 documented with tPA initiated.  History of Somatization disorder  Beta thalassemia  Breast cancer  History of pancreatitis  hgb a1c 6.7  LDL 62 at goal This patient reports a history of multiple episodes in the past a left hemiparesis. There is a possibility that this current episode could be psychogenic. The patient has a chronic pain disorder, and she is on multiple opiate medications And other psychoactive medications prior to this admission. The patient was on Plavix prior to admission. She is to continue this daily. She was unable to undergo MRI due to claustrophobia, repeat CT negative. Stroke work up completed, OK for D/C with [facility] PT  Patient with continued  stroke symptoms of left hemiparesis. Physical therapy, occupational therapy and speech therapy evaluated patient. They recommend continue home therapy. She has told me and OT that she has 24/7 care at home.  Discharge Exam  Blood pressure 135/80, pulse 73, temperature 98.5 F (36.9 C), temperature source Oral, resp. rate 18, height 5\' 4"  (1.626 m), weight 97.251 kg (214 lb 6.4 oz), SpO2 98.00%.   Physical Exam  General: The patient is alert and cooperative at the time of the examination.  respiratory: Lungs fields are clear  Abdomen: Positive bowel sounds, nontender  Skin: No significant  peripheral edema is noted.  Neurologic Exam  Cranial nerves: Facial symmetry is present. Speech is normal, no aphasia or dysarthria is noted. Extraocular movements are full. Visual fields are full.  Motor: The patient has good strength in all 4 extremities, with the exception that there is 4/5 weakness of the left lower extremity, some decrease in pinprick sensation of the left leg, otherwise unremarkable.  Coordination: The patient has good finger-nose-finger and heel-to-shin bilaterally, with exception of the use of the left leg. Gait is slow with walker and 1 assist.  Gait and station: The gait was not tested. No drift is seen.  Reflexes: Deep tendon reflexes are symmetric. Toes are downgoing bilaterally.   Discharge Diet   Cardiac thin liquids   Discharge Plan    Disposition:  Home with 24/7 care; HHPT/OT/case disease management   clopidogrel 75 mg orally every day for secondary stroke prevention.  Ongoing risk factor control by Primary Care Physician. Risk factor recommendations:  Hypertension target range 130-140/70-80 Lipid range - LDL < 100 and checked every 6 months, fasting Diabetes - HgB A1C <7 Continue smoking cessation, previous smoker.     Follow-up No primary provider on file. in 1 month.  Follow-up with Dr. Delia Heady, Stroke Clinic in 2 months.  45 minutes were spent preparing discharge.  Signed Gwendolyn Lima. Manson Passey, [facility], MBA, MHA [facility] Pager: [phone] 10/17/2012 4:57 PM  I have personally examined this patient, reviewed pertinent data and developed the plan of care. I agree with above. Lesly Dukes

## 2012-10-17 NOTE — Progress Notes (Signed)
Occupational Therapy Treatment Patient Details Name: [name] MRN: [id number] DOB: 07-31-1965 Today's Date: 10/17/2012 Time: 8295-6213 OT Time Calculation (min): 19 min  OT Assessment / Plan / Recommendation Comments on Treatment Session Pt making good progress. Continue to recommend 24/7 sup/assist at home.    Follow Up Recommendations  Home health OT;Supervision/Assistance - 24 hour    Barriers to Discharge       Equipment Recommendations  None recommended by OT    Recommendations for Other Services    Frequency Min 3X/week   Plan Discharge plan remains appropriate    Precautions / Restrictions Precautions Precautions: Fall Restrictions Weight Bearing Restrictions: No   Pertinent Vitals/Pain See vitals    ADL  Grooming: Wash/dry face;Teeth care;Performed;Min guard Where Assessed - Grooming: Supported standing Upper Body Dressing: Performed;Set up Where Assessed - Upper Body Dressing: Unsupported sitting Lower Body Dressing: Performed;Min guard (donned socks) Where Assessed - Lower Body Dressing: Unsupported sitting Toilet Transfer: Simulated;Min guard Toilet Transfer Method:  (ambulating) Acupuncturist:  (bed) Equipment Used: Gait belt;Rolling walker    OT Diagnosis:    OT Problem List:   OT Treatment Interventions:     OT Goals ADL Goals Pt Will Perform Grooming: with supervision;Standing at sink ADL Goal: Grooming - Progress: Progressing toward goals Pt Will Perform Upper Body Dressing: with supervision;Sit to stand from chair;Sit to stand from bed ADL Goal: Upper Body Dressing - Progress: Met Pt Will Perform Lower Body Dressing: with min assist;Sit to stand from chair;Sit to stand from bed ADL Goal: Lower Body Dressing - Progress: Met Pt Will Transfer to Toilet: with supervision;Ambulation;with DME;Comfort height toilet ADL Goal: Toilet Transfer - Progress: Progressing toward goals  Visit Information  Last OT Received On:  10/17/12 Assistance Needed: +1 PT/OT Co-Evaluation/Treatment: Yes    Subjective Data      Prior Functioning       Cognition  Cognition Arousal/Alertness: Awake/alert Behavior During Therapy: WFL for tasks assessed/performed Following Commands: Follows one step commands consistently Safety/Judgement: Decreased awareness of safety Problem Solving: Requires verbal cues;Slow processing    Mobility  Bed Mobility Bed Mobility: Supine to Sit;Sitting - Scoot to Edge of Bed;Sit to Supine Supine to Sit: 5: Supervision Sitting - Scoot to Edge of Bed: 5: Supervision Sit to Supine: 5: Supervision Details for Bed Mobility Assistance: Patient able to perform without physical assist. Transfers Transfers: Sit to Stand;Stand to Sit Sit to Stand: 4: Min guard;From bed;With upper extremity assist Stand to Sit: 4: Min guard;With upper extremity assist;To bed Details for Transfer Assistance: VCs for proper hand placement    Exercises      Balance Static Sitting Balance Static Sitting - Balance Support: Feet supported Static Sitting - Level of Assistance: 7: Independent Static Sitting - Comment/# of Minutes: seated EOB without difficulty Static Standing Balance Static Standing - Balance Support: Bilateral upper extremity supported;During functional activity Static Standing - Level of Assistance: 5: Stand by assistance Static Standing - Comment/# of Minutes: 4 minutes at counter height for functional activities High Level Balance High Level Balance Activites: Side stepping;Backward walking;Direction changes;Turns High Level Balance Comments: able to perform activities but at a very slow rate, Min Guard   End of Session OT - End of Session Equipment Utilized During Treatment: Gait belt Activity Tolerance: Patient tolerated treatment well Patient left: in bed;with call bell/phone within reach;with bed alarm set Nurse Communication: Mobility status  GO    10/17/2012 Cipriano Mile  OTR/L Pager [phone] Office [phone]  Cipriano Mile 10/17/2012,  10:11 AM

## 2012-10-17 NOTE — Care Management Note (Addendum)
  Page 2 of 2   10/17/2012     11:12:07 AM   CARE MANAGEMENT NOTE 10/17/2012  Patient:  [name], [name]   Account Number:  [id number]  Date Initiated:  10/17/2012  Documentation initiated by:  GRAVES-BIGELOW,Derius Ghosh  Subjective/Objective Assessment:   Pt admitted with dysarthria and left arm and leg weakness. Pt is from [facility]. Pt will need HH services at disposition. 1107 MD- please write order for [facility] RN, PT/ OT for [facility] services. Services set up with [facility].     Action/Plan:   CM did make referral for services. SOC to begin within 24-48 hours post d/c. RN will call MD for orders.   Anticipated DC Date:  10/17/2012   Anticipated DC Plan:  HOME W HOME HEALTH SERVICES      DC Planning Services  CM consult      [facility] Choice  HOME HEALTH   Choice offered to / List presented to:  C-1 Patient        HH arranged  HH-1 RN  HH-10 DISEASE MANAGEMENT  HH-2 PT  HH-3 OT      HH agency  OTHER - SEE NOTE   Status of service:  Completed, signed off Medicare Important Message given?   (If response is "NO", the following Medicare IM given date fields will be blank) Date Medicare IM given:   Date Additional Medicare IM given:    Discharge Disposition:  HOME W HOME HEALTH SERVICES  Per UR Regulation:  Reviewed for med. necessity/level of care/duration of stay  If discussed at Long Length of Stay Meetings, dates discussed:    Comments:   PCP Dr. Merri [facility] [phone]

## 2012-10-17 NOTE — Progress Notes (Signed)
Physical Therapy Treatment Patient Details Name: [name] MRN: [id number] DOB: [birth year] Today's Date: 10/17/2012 Time: 4098-1191 PT Time Calculation (min): 25 min  PT Assessment / Plan / Recommendation Comments on Treatment Session  Patient demonstrates improvements in functional mobility compared to prior session. Patient currently at min guard with increased time to perform activities.  Patient confirms that she does have 24/7 caregiver at home to provide assist. If patient can safely navigate stairs next treatment session, wiould consider patient for discharge home with assist. Will continue to see and progress activity as tolerated.    Follow Up Recommendations  Home health PT;Supervision/Assistance - 24 hour     Does the patient have the potential to tolerate intense rehabilitation     Barriers to Discharge        Equipment Recommendations  None recommended by PT    Recommendations for Other Services    Frequency Min 3X/week   Plan Discharge plan remains appropriate    Precautions / Restrictions Precautions Precautions: Fall Restrictions Weight Bearing Restrictions: No   Pertinent Vitals/Pain NAD    Mobility  Bed Mobility Bed Mobility: Supine to Sit;Sitting - Scoot to Edge of Bed;Sit to Supine Supine to Sit: 5: Supervision Sitting - Scoot to Edge of Bed: 5: Supervision Sit to Supine: 5: Supervision Details for Bed Mobility Assistance: Patient able to perform without physical assist. Transfers Transfers: Sit to Stand;Stand to Sit Sit to Stand: 4: Min guard;From bed Stand to Sit: 4: Min guard;To bed Details for Transfer Assistance: VCs for proper hand placement Ambulation/Gait Ambulation/Gait Assistance: 4: Min guard Ambulation Distance (Feet): 50 Feet Assistive device: Rolling walker Ambulation/Gait Assistance Details: very slow; VCs for sequencing and increased cadence.  Patient requires significant time for ambulation but demonstrates no LOB. Gait  Pattern: Step-to pattern;Decreased stride length;Decreased hip/knee flexion - left;Decreased weight shift to left;Antalgic;Trunk flexed Gait velocity: significantly decreased General Gait Details: steady but slow Stairs: No      PT Goals Acute Rehab PT Goals PT Goal Formulation: With patient Time For Goal Achievement: 10/30/12 Potential to Achieve Goals: Good Pt will go Supine/Side to Sit: Independently PT Goal: Supine/Side to Sit - Progress: Progressing toward goal Pt will go Sit to Stand: Independently PT Goal: Sit to Stand - Progress: Progressing toward goal Pt will Ambulate: 16 - 50 feet;with modified independence;with least restrictive assistive device PT Goal: Ambulate - Progress: Progressing toward goal Pt will Go Up / Down Stairs: 6-9 stairs;with least restrictive assistive device;with supervision  Visit Information  Last PT Received On: 10/17/12 Assistance Needed: +1    Subjective Data  Subjective: I can use my left arm more today, but my leg is still bad Patient Stated Goal: to go home   Cognition  Cognition Arousal/Alertness: Awake/alert Behavior During Therapy: [facility] for tasks assessed/performed Following Commands: Follows one step commands consistently Safety/Judgement: Decreased awareness of safety Problem Solving: Requires verbal cues;Slow processing    Balance  Static Sitting Balance Static Sitting - Balance Support: Feet supported Static Sitting - Level of Assistance: 7: Independent Static Sitting - Comment/# of Minutes: seated EOB without difficulty Static Standing Balance Static Standing - Balance Support: Bilateral upper extremity supported;During functional activity Static Standing - Level of Assistance: 5: Stand by assistance Static Standing - Comment/# of Minutes: 4 minutes at counter height for functional activities High Level Balance High Level Balance Activites: Side stepping;Backward walking;Direction changes;Turns High Level Balance Comments:  able to perform activities but at a very slow rate, Min Guard  End of Session PT -  End of Session Equipment Utilized During Treatment: Gait belt Activity Tolerance: Patient tolerated treatment well;Patient limited by fatigue Patient left: in bed;with call bell/phone within reach;with bed alarm set Nurse Communication: Mobility status   GP     Fabio Asa 10/17/2012, 9:41 AM Charlotte Crumb, PT DPT  [phone]

## 2012-10-17 NOTE — Progress Notes (Signed)
Pt requesting to speak with the charge RN as well as MD. Pt upset over meds asking to get her xanax scheduled twice a day. Pt asking to leave AMA. Charge RN went to speak with the pt. MD notified. New orders for xanax 1 mg bid prn. Pt made aware of new orders. Will continue to monitor the pt. Sanda Linger

## 2012-10-17 NOTE — Progress Notes (Signed)
Stroke Team Progress Note  HISTORY [name] is an 47 y.o. female who awoke this morning feeling fine. This AM at 08:30 she was going to the mall when she noted her speech was not normal--"slurred". After about one minute she stated her left face, arm, chest, abdomen and leg became weak and had no sensation. Patient was brought to [facility] ER where Code stroke was called and tPA was initiated. Patient was transported to [facility] via [facility]. During this time she noted N/V. tPA was stopped for fear of possible ICH. On arrival to [facility] ED she was brought straight to CT scanner where head CT showed no bleed or acute stroke. She was brought up to 3100 where tPA was restarted. She continues to have left sided numbness and left arm and leg weakness.   The patient received TPA ending at 1200 on 10/15/12.  SUBJECTIVE  No family is at bedside. The patient is alert and cooperative. No complaints. Getting ready to work with OT. Says she is beginning to use left arm more.   Has 24/7 nurse at home. OT hopeful that she can return to same.  OBJECTIVE Most recent Vital Signs: Filed Vitals:   10/16/12 2030 10/16/12 2139 10/17/12 0016 10/17/12 0400  BP: 100/67 112/68 121/74 108/59  Pulse: 95 90 74 83  Temp: 97.9 F (36.6 C)   97.7 F (36.5 C)  TempSrc: Oral   Oral  Resp:      Height:      Weight:    97.251 kg (214 lb 6.4 oz)  SpO2: 100%  100% 100%   CBG (last 3)   Recent Labs  10/16/12 1645 10/16/12 2037 10/17/12 0809  GLUCAP 156* 156* 195*    IV Fluid Intake:   . sodium chloride 75 mL/hr at 10/16/12 2352    MEDICATIONS  . amLODipine  10 mg Oral BID  . clopidogrel  75 mg Oral Q breakfast  . docusate sodium  100 mg Oral Daily  . folic acid  1 mg Oral Daily  . furosemide  80 mg Oral Daily  . hydroxyurea  500 mg Oral BID  . lisinopril  10 mg Oral Daily  . pantoprazole  40 mg Oral Q1200  . venlafaxine XR  75 mg Oral Daily   PRN:  acetaminophen, acetaminophen, ALPRAZolam,  HYDROcodone-acetaminophen, labetalol, mirtazapine, nicotine, oxyCODONE-acetaminophen, senna-docusate, tiZANidine, zolpidem  Diet:  Cardiac thin liquids Activity:  Up with assistance DVT Prophylaxis:  SCD  CLINICALLY SIGNIFICANT STUDIES Basic Metabolic Panel:   Recent Labs Lab 10/17/12 0515  NA 138  K 3.6  CL 103  CO2 27  GLUCOSE 106*  BUN 9  CREATININE 0.63  CALCIUM 9.1   Liver Function Tests: No results found for this basename: AST, ALT, ALKPHOS, BILITOT, PROT, ALBUMIN,  in the last 168 hours CBC:   Recent Labs Lab 10/17/12 0515  WBC 5.5  NEUTROABS PENDING  HGB 8.9*  HCT 27.0*  MCV 68.7*  PLT 307   Coagulation: No results found for this basename: LABPROT, INR,  in the last 168 hours Cardiac Enzymes: No results found for this basename: CKTOTAL, CKMB, CKMBINDEX, TROPONINI,  in the last 168 hours Urinalysis: No results found for this basename: COLORURINE, APPERANCEUR, LABSPEC, PHURINE, GLUCOSEU, HGBUR, BILIRUBINUR, KETONESUR, PROTEINUR, UROBILINOGEN, NITRITE, LEUKOCYTESUR,  in the last 168 hours Lipid Panel    Component Value Date/Time   CHOL 128 10/17/2012 0515   TRIG 99 10/17/2012 0515   HDL 46 10/17/2012 0515   CHOLHDL 2.8 10/17/2012  0515   VLDL 20 10/17/2012 0515   LDLCALC 62 10/17/2012 0515   HgbA1C  Lab Results  Component Value Date   HGBA1C 5.7* 10/16/2012    Urine Drug Screen:     Component Value Date/Time   LABOPIA POSITIVE* 10/17/2012 0611   COCAINSCRNUR NONE DETECTED 10/17/2012 0611   LABBENZ POSITIVE* 10/17/2012 0611   AMPHETMU NONE DETECTED 10/17/2012 0611   THCU NONE DETECTED 10/17/2012 0611   LABBARB NONE DETECTED 10/17/2012 0611    Alcohol Level: No results found for this basename: ETH,  in the last 168 hours  Dg Chest 2 View  10/16/2012   *RADIOLOGY REPORT*  Clinical Data: Stroke.  Left-sided chest pain  CHEST - 2 VIEW  Comparison: Chest radiograph 06/08 01/2013  Findings: Right IJ Port-A-Cath remains in stable position with the distal tip in the mid  superior vena cava.  Heart and mediastinal contours are stable, with borderline cardiomegaly.  Lung volumes are low.  No focal airspace opacities, effusions, or pneumothorax.  IMPRESSION: Low lung volumes.  No acute findings identified.   Original Report Authenticated By: Britta Mccreedy, M.D.   Ct Head Wo Contrast  10/16/2012   *RADIOLOGY REPORT*  Clinical Data: Dysarthria.  Left arm and leg weakness.  CT HEAD WITHOUT CONTRAST  Technique:  Contiguous axial images were obtained from the base of the skull through the vertex without contrast.  Comparison: CT head from [facility] and from [facility] 10/15/2012.  Findings: The patient was unable to remain motionless for the initial scan.  Images were repeated with the patient's head tilted to the right.  No acute stroke or hemorrhage.  No mass lesion or hydrocephalus. No extra-axial fluid.  No evidence for acute intracranial hemorrhage status post t-PA administration.  Intact calvarium.   No acute sinus or mastoid disease.  No change from prior normal studies.  IMPRESSION: Negative exam.   Original Report Authenticated By: Davonna Belling, M.D.   Ct Head Wo Contrast  10/15/2012   *RADIOLOGY REPORT*  Clinical Data: Code stroke started t-PA and developed headache.  CT HEAD WITHOUT CONTRAST  Technique:  Contiguous axial images were obtained from the base of the skull through the vertex without contrast.  Comparison: 10/15/2012 and 07/06/2012.  Findings: No intracranial hemorrhage.  No CT evidence of large acute infarct.  No hydrocephalus.  No intracranial mass lesion detected on this unenhanced exam.  Mastoid air cells, middle ear cavities and visualized sinuses are clear.  IMPRESSION: No intracranial hemorrhage or CT evidence of large acute infarct.   Original Report Authenticated By: Lacy Duverney, M.D.    MRI of the brain    MRA of the brain    2D Echocardiogram EF 60%. No cardiac source of embolism identified.   Carotid Doppler  The vertebral arteries  appear patent with antegrade flow. Findings consistent with less than 39 percent stenosis involving the right internal carotid artery and the left internal carotid artery  EKG    Therapy Recommendations 24/7 caregiver at home, will need to continue therapy at home at this point  Physical Exam  General: The patient is alert and cooperative at the time of the examination.   respiratory: Lungs fields are clear  Abdomen: Positive bowel sounds, nontender  Skin: No significant peripheral edema is noted.   Neurologic Exam  Cranial nerves: Facial symmetry is present. Speech is normal, no aphasia or dysarthria is noted. Extraocular movements are full. Visual fields are full.  Motor: The patient has good strength in all 4 extremities,  with the exception that there is 4/5 weakness of the left lower extremity, some decrease in pinprick sensation of the left leg, otherwise unremarkable.  Coordination: The patient has good finger-nose-finger and heel-to-shin bilaterally, with exception of the use of the left leg. Gait is slow with walker and 1 assist.  Gait and station: The gait was not tested. No drift is seen.  Reflexes: Deep tendon reflexes are symmetric. Toes are downgoing bilaterally.    ASSESSMENT [name] is a 47 y.o. female presenting with a left hemiparesis. The patient received TPA, and was transferred from [facility] hospital to [facility]. CT scan of the [facility] was un[facility]able. The patient has had improvement in her deficit, with some residual left leg weakness. The patient indicates that she has had episodes of left hemiparesis on multiple occasions in the past, the last episode was 4 months ago.  The patient was on Plavix prior to admission. The patient has a significant psychiatric history.   Left hemiparesis, rule out stroke  History of Somatization disorder  Beta thalassemia  Breast cancer  History of pancreatitis  hgb a1c 6.7  LDL 62  at goal  This patient reports a history of multiple episodes in the past a left hemiparesis. There is a possibility that this current episode could be psychogenic. The patient has a chronic pain disorder, and she is on multiple opiate medications  And other psychoactive medications prior to this admission. The patient was on Plavix prior to admission.   Hospital day # 2  TREATMENT/PLAN  On plavix  Patient unable to do MRI due to claustrophobia, repeat CT negative.  Stroke work up completed, OK for D/C with HH PT   Guy Franco  10/17/2012 8:54 AM  Lesly Dukes

## 2012-10-28 DIAGNOSIS — R079 Chest pain, unspecified: Secondary | ICD-10-CM

## 2012-11-10 ENCOUNTER — Telehealth: Payer: Self-pay | Admitting: *Deleted

## 2012-11-10 DIAGNOSIS — I69328 Other speech and language deficits following cerebral infarction: Secondary | ICD-10-CM

## 2012-11-10 DIAGNOSIS — R269 Unspecified abnormalities of gait and mobility: Secondary | ICD-10-CM

## 2012-11-10 DIAGNOSIS — R079 Chest pain, unspecified: Secondary | ICD-10-CM

## 2012-11-10 DIAGNOSIS — I69359 Hemiplegia and hemiparesis following cerebral infarction affecting unspecified side: Secondary | ICD-10-CM

## 2012-11-10 NOTE — Telephone Encounter (Signed)
PT calling for order for DME rollator walker with seat pt (post stroke).  [facility].  [phone].   Tried pcp and referred back to MD who saw her in hospital.

## 2012-11-10 NOTE — Telephone Encounter (Signed)
I will write the order for the rolling walker with seat.

## 2012-11-13 ENCOUNTER — Emergency Department (HOSPITAL_COMMUNITY): Payer: Medicaid - Out of State

## 2012-11-13 ENCOUNTER — Encounter (HOSPITAL_COMMUNITY): Payer: Self-pay | Admitting: *Deleted

## 2012-11-13 ENCOUNTER — Emergency Department (HOSPITAL_COMMUNITY)
Admission: EM | Admit: 2012-11-13 | Discharge: 2012-11-13 | Disposition: A | Discharge status: Home / Self Care | Payer: Medicaid - Out of State | Attending: Emergency Medicine | Admitting: Emergency Medicine

## 2012-11-13 DIAGNOSIS — F329 Major depressive disorder, single episode, unspecified: Secondary | ICD-10-CM | POA: Insufficient documentation

## 2012-11-13 DIAGNOSIS — R5383 Other fatigue: Secondary | ICD-10-CM | POA: Insufficient documentation

## 2012-11-13 DIAGNOSIS — R109 Unspecified abdominal pain: Secondary | ICD-10-CM | POA: Insufficient documentation

## 2012-11-13 DIAGNOSIS — F411 Generalized anxiety disorder: Secondary | ICD-10-CM | POA: Insufficient documentation

## 2012-11-13 DIAGNOSIS — Z7902 Long term (current) use of antithrombotics/antiplatelets: Secondary | ICD-10-CM | POA: Insufficient documentation

## 2012-11-13 DIAGNOSIS — F3289 Other specified depressive episodes: Secondary | ICD-10-CM | POA: Insufficient documentation

## 2012-11-13 DIAGNOSIS — R5381 Other malaise: Secondary | ICD-10-CM | POA: Insufficient documentation

## 2012-11-13 DIAGNOSIS — K5900 Constipation, unspecified: Secondary | ICD-10-CM | POA: Insufficient documentation

## 2012-11-13 DIAGNOSIS — Z91040 Latex allergy status: Secondary | ICD-10-CM | POA: Insufficient documentation

## 2012-11-13 DIAGNOSIS — Z853 Personal history of malignant neoplasm of breast: Secondary | ICD-10-CM | POA: Insufficient documentation

## 2012-11-13 DIAGNOSIS — Z8673 Personal history of transient ischemic attack (TIA), and cerebral infarction without residual deficits: Secondary | ICD-10-CM | POA: Insufficient documentation

## 2012-11-13 DIAGNOSIS — Z862 Personal history of diseases of the blood and blood-forming organs and certain disorders involving the immune mechanism: Secondary | ICD-10-CM | POA: Insufficient documentation

## 2012-11-13 DIAGNOSIS — Z8659 Personal history of other mental and behavioral disorders: Secondary | ICD-10-CM | POA: Insufficient documentation

## 2012-11-13 DIAGNOSIS — Z87891 Personal history of nicotine dependence: Secondary | ICD-10-CM | POA: Insufficient documentation

## 2012-11-13 DIAGNOSIS — I252 Old myocardial infarction: Secondary | ICD-10-CM | POA: Insufficient documentation

## 2012-11-13 DIAGNOSIS — I10 Essential (primary) hypertension: Secondary | ICD-10-CM | POA: Insufficient documentation

## 2012-11-13 DIAGNOSIS — Z79899 Other long term (current) drug therapy: Secondary | ICD-10-CM | POA: Insufficient documentation

## 2012-11-13 DIAGNOSIS — R1110 Vomiting, unspecified: Secondary | ICD-10-CM | POA: Insufficient documentation

## 2012-11-13 DIAGNOSIS — Z8719 Personal history of other diseases of the digestive system: Secondary | ICD-10-CM | POA: Insufficient documentation

## 2012-11-13 HISTORY — DX: Essential (primary) hypertension: I10

## 2012-11-13 LAB — URINALYSIS, ROUTINE W REFLEX MICROSCOPIC
Glucose, UA: NEGATIVE mg/dL
Leukocytes, UA: NEGATIVE
Specific Gravity, Urine: >1.03 — ABNORMAL HIGH (ref 1.005–1.030)
Urobilinogen, UA: 0.2 mg/dL (ref 0.0–1.0)

## 2012-11-13 LAB — POCT I-STAT, CHEM 8
Creatinine, Ser: 0.6 mg/dL (ref 0.50–1.10)
Hemoglobin: 10.9 g/dL — ABNORMAL LOW (ref 12.0–15.0)
Potassium: 3.7 mEq/L (ref 3.5–5.1)
Sodium: 141 mEq/L (ref 135–145)

## 2012-11-13 LAB — URINE MICROSCOPIC-ADD ON

## 2012-11-13 MED ORDER — PROMETHAZINE HCL 25 MG PO TABS: 25.0000 mg | ORAL_TABLET | Freq: Three times a day (TID) | ORAL | Status: DC | PRN

## 2012-11-13 NOTE — ED Notes (Signed)
No BM x 3 weeks.  Abdominal pain lower quads bilaterally.  Passing flatus.  Has used stool softener, enemas w/out results.  Vomiting coffee ground emesis x 2 days ago.

## 2012-11-13 NOTE — ED Provider Notes (Signed)
History    CSN: [id number] Arrival date & time 11/13/12  1259  First MD Initiated Contact with Patient 11/13/12 1311     Chief Complaint  Patient presents with  . Constipation  . Emesis  . Abdominal Pain   (Consider location/radiation/quality/duration/timing/severity/associated sxs/prior Treatment) HPI She states she stopped her Roxicodone 15 mg tablets in June. She states she has not had a BM for 3 weeks. She states she has taken stool softeners and mineral oil enema without results. She states she has a decreased appetite and when she eats she feels full. She states she vomited twice yesterday and 3 times today. She denies any fever. She states she has normal urination although she thinks her urine is dark. She states she feels weak. Patient recently had a stroke. She states normally her constipation is controlled with taking Colace which did not help this time. She reports she's had a partial hysterectomy, prior tubal pregnancy, and a laparoscopy done in the past. She states she called her PCPs office and they told her to come to the ED. She also states her home health nurse checked her today and said she had decreased bowel sounds and she needed to come to the ED.  PCP Dr Merri Brunette, [place]  Past Medical History  Diagnosis Date  . Somatization disorder   . Depression   . Anxiety   . Beta thalassemia trait   . CVA (cerebral infarction)   . Pancreatitis   . Breast cancer   . Hypertension    Past Surgical History  Procedure Laterality Date  . Tubal ligation    . Cesarean section    . Partial hysterectomy    . Cardiac catheterization    . Breast lumpectomy     Family History  Problem Relation Age of Onset  . Diabetes Mother   . Diabetes Father   . Hyperlipidemia Mother   . Hyperlipidemia Father   . Hypertension Mother   . Hypertension Father    History  Substance Use Topics  . Smoking status: Former Smoker -- 0.50 packs/day    Types: Cigarettes    Quit date:  10/11/2012  . Smokeless tobacco: Not on file  . Alcohol Use: No   On disability  OB History   Grav Para Term Preterm Abortions TAB SAB Ect Mult Living                 Review of Systems  All other systems reviewed and are negative.    Allergies  Latex; Metoclopramide hcl; Penicillins; Fentanyl; Hydromorphone hcl; Iohexol; Ketorolac tromethamine; Ondansetron hcl; and Prochlorperazine edisylate  Home Medications   Current Outpatient Rx  Name  Route  Sig  Dispense  Refill  . ALPRAZolam (XANAX) 1 MG tablet   Oral   Take 1 mg by mouth at bedtime as needed for anxiety.         Marland Kitchen amitriptyline (ELAVIL) 25 MG tablet   Oral   Take 25-50 mg by mouth at bedtime as needed (for mood or imsomnia).         Marland Kitchen amLODipine (NORVASC) 10 MG tablet   Oral   Take 10 mg by mouth 2 (two) times daily.         . ciprofloxacin (CIPRO) 500 MG tablet   Oral   Take 500 mg by mouth daily.         . clarithromycin (BIAXIN) 500 MG tablet   Oral   Take 500 mg by mouth 2 (two) times daily.         Marland Kitchen  clopidogrel (PLAVIX) 75 MG tablet   Oral   Take 75 mg by mouth daily.         Marland Kitchen docusate sodium (COLACE) 100 MG capsule   Oral   Take 100 mg by mouth daily.         . folic acid (FOLVITE) 1 MG tablet   Oral   Take 1 mg by mouth daily.         . furosemide (LASIX) 80 MG tablet   Oral   Take 80 mg by mouth every morning.         . hydroxyurea (HYDREA) 500 MG capsule   Oral   Take 500 mg by mouth 2 (two) times daily. May take with food to minimize GI side effects.         Marland Kitchen lisinopril (PRINIVIL,ZESTRIL) 10 MG tablet   Oral   Take 10 mg by mouth every morning.         . mirtazapine (REMERON) 15 MG tablet   Oral   Take 15 mg by mouth at bedtime as needed (for mood, appetite and insomnia).         . nicotine (NICODERM CQ - DOSED IN MG/24 HOURS) 21 mg/24hr patch   Transdermal   Place 1 patch onto the skin daily as needed (for nicotine cravings).         .  nitroGLYCERIN (NITROSTAT) 0.4 MG SL tablet   Sublingual   Place 0.4 mg under the tongue every 5 (five) minutes as needed for chest pain.         Marland Kitchen omeprazole (PRILOSEC) 20 MG capsule   Oral   Take 20 mg by mouth 2 (two) times daily.         . potassium chloride SA (K-DUR,KLOR-CON) 20 MEQ tablet   Oral   Take 20 mEq by mouth daily.         . promethazine (PHENERGAN) 25 MG tablet   Oral   Take 25 mg by mouth every 8 (eight) hours as needed for nausea.         Marland Kitchen tiZANidine (ZANAFLEX) 4 MG tablet   Oral   Take 4 mg by mouth every 8 (eight) hours as needed (for muscle pains/cramps).         . venlafaxine XR (EFFEXOR-XR) 75 MG 24 hr capsule   Oral   Take 75 mg by mouth daily.         Marland Kitchen zolpidem (AMBIEN) 10 MG tablet   Oral   Take 10 mg by mouth at bedtime as needed for sleep.         Marland Kitchen albuterol (PROVENTIL HFA;VENTOLIN HFA) 108 (90 BASE) MCG/ACT inhaler   Inhalation   Inhale 2 puffs into the lungs every 8 (eight) hours as needed for shortness of breath.         . promethazine (PHENERGAN) 25 MG tablet   Oral   Take 1 tablet (25 mg total) by mouth every 8 (eight) hours as needed for nausea.   8 tablet   0    BP 117/78  Pulse 99  Temp(Src) 98.7 F (37.1 C) (Oral)  Resp 18  Ht 5\' 4"  (1.626 m)  Wt 202 lb (91.627 kg)  BMI 34.66 kg/m2  SpO2 97%  Vital signs normal   Physical Exam  Nursing note and vitals reviewed. Constitutional: She is oriented to person, place, and time. She appears well-developed and well-nourished.  Non-toxic appearance. She does not appear ill. No distress.  HENT:  Head: Normocephalic and atraumatic.  Right Ear: External ear normal.  Left Ear: External ear normal.  Nose: Nose normal. No mucosal edema or rhinorrhea.  Mouth/Throat: Oropharynx is clear and moist and mucous membranes are normal. No dental abscesses or edematous.  Eyes: Conjunctivae and EOM are normal. Pupils are equal, round, and reactive to light.  Neck: Normal range  of motion and full passive range of motion without pain. Neck supple.  Cardiovascular: Normal rate, regular rhythm and normal heart sounds.  Exam reveals no gallop and no friction rub.   No murmur heard. Pulmonary/Chest: Effort normal and breath sounds normal. No respiratory distress. She has no wheezes. She has no rhonchi. She has no rales. She exhibits no tenderness and no crepitus.  Abdominal: Soft. Normal appearance and bowel sounds are normal. She exhibits no distension. There is no tenderness. There is no rebound and no guarding.  No focal tenderness  Musculoskeletal: Normal range of motion. She exhibits no edema and no tenderness.  Moves all extremities well.   Neurological: She is alert and oriented to person, place, and time. She has normal strength. No cranial nerve deficit.  Skin: Skin is warm, dry and intact. No rash noted. No erythema. No pallor.  Psychiatric: She has a normal mood and affect. Her speech is normal and behavior is normal. Her mood appears not anxious.    ED Course  Procedures (including critical care time)  Pt given results of her tests and discussed how to take the miralax for her constipation.     Results for orders placed during the hospital encounter of 11/13/12  URINALYSIS, ROUTINE W REFLEX MICROSCOPIC      Result Value Range   Color, Urine YELLOW  YELLOW   APPearance CLEAR  CLEAR   Specific Gravity, Urine >1.030 (*) 1.005 - 1.030   pH 7.5  5.0 - 8.0   Glucose, UA NEGATIVE  NEGATIVE mg/dL   Hgb urine dipstick SMALL (*) NEGATIVE   Bilirubin Urine NEGATIVE  NEGATIVE   Ketones, ur NEGATIVE  NEGATIVE mg/dL   Protein, ur NEGATIVE  NEGATIVE mg/dL   Urobilinogen, UA 0.2  0.0 - 1.0 mg/dL   Nitrite NEGATIVE  NEGATIVE   Leukocytes, UA NEGATIVE  NEGATIVE  URINE MICROSCOPIC-ADD ON      Result Value Range   Squamous Epithelial / LPF FEW (*) RARE   WBC, UA 0-2  <3 WBC/hpf   RBC / HPF 3-6  <3 RBC/hpf   Bacteria, UA FEW (*) RARE  POCT I-STAT, CHEM 8       Result Value Range   Sodium 141  135 - 145 mEq/L   Potassium 3.7  3.5 - 5.1 mEq/L   Chloride 108  96 - 112 mEq/L   BUN 13  6 - 23 mg/dL   Creatinine, Ser 4.78  0.50 - 1.10 mg/dL   Glucose, Bld 78  70 - 99 mg/dL   Calcium, Ion 2.95  6.21 - 1.23 mmol/L   TCO2 23  0 - 100 mmol/L   Hemoglobin 10.9 (*) 12.0 - 15.0 g/dL   HCT 30.8 (*) 65.7 - 84.6 %   Laboratory interpretation all normal except improving anemia   Dg Abd Acute W/chest  11/13/2012   *RADIOLOGY REPORT*  Clinical Data: Constipation.  Breast cancer, sickle cell  ACUTE ABDOMEN SERIES (ABDOMEN 2 VIEW & CHEST 1 VIEW)  Comparison: 10/28/2012  Findings: Port-A-Cath tip in the SVC.  This is unchanged.  The lungs are clear without infiltrate or effusion.  Negative for  pneumonia or heart failure.  Mild stool in the colon.  Negative for bowel obstruction or ileus. No free air.  No renal calcifications are noted.  IMPRESSION: Mild constipation.  Negative for bowel obstruction.  No acute cardiopulmonary abnormality.   Original Report Authenticated By: Janeece Riggers, M.D.     1. Constipation     Discharge medications miralax OTC   Plan discharge   Devoria Albe, MD, FACEP   MDM    [name], MD 11/13/12 [phone]

## 2012-11-13 NOTE — ED Notes (Signed)
Instructions, prescriptions and f/u information given/reviewed - verbalizes understanding.  

## 2012-11-19 ENCOUNTER — Encounter (HOSPITAL_COMMUNITY): Payer: Self-pay | Admitting: Emergency Medicine

## 2012-11-19 ENCOUNTER — Emergency Department (HOSPITAL_COMMUNITY): Payer: Medicaid - Out of State

## 2012-11-19 ENCOUNTER — Emergency Department (HOSPITAL_COMMUNITY)
Admission: EM | Admit: 2012-11-19 | Discharge: 2012-11-19 | Disposition: A | Discharge status: Home / Self Care | Payer: Medicaid - Out of State | Attending: Emergency Medicine | Admitting: Emergency Medicine

## 2012-11-19 DIAGNOSIS — R0789 Other chest pain: Secondary | ICD-10-CM

## 2012-11-19 DIAGNOSIS — M25476 Effusion, unspecified foot: Secondary | ICD-10-CM | POA: Insufficient documentation

## 2012-11-19 DIAGNOSIS — Z79899 Other long term (current) drug therapy: Secondary | ICD-10-CM | POA: Insufficient documentation

## 2012-11-19 DIAGNOSIS — Z87891 Personal history of nicotine dependence: Secondary | ICD-10-CM | POA: Insufficient documentation

## 2012-11-19 DIAGNOSIS — R112 Nausea with vomiting, unspecified: Secondary | ICD-10-CM | POA: Insufficient documentation

## 2012-11-19 DIAGNOSIS — R350 Frequency of micturition: Secondary | ICD-10-CM | POA: Insufficient documentation

## 2012-11-19 DIAGNOSIS — F411 Generalized anxiety disorder: Secondary | ICD-10-CM | POA: Insufficient documentation

## 2012-11-19 DIAGNOSIS — D5700 Hb-SS disease with crisis, unspecified: Secondary | ICD-10-CM | POA: Insufficient documentation

## 2012-11-19 DIAGNOSIS — Z91040 Latex allergy status: Secondary | ICD-10-CM | POA: Insufficient documentation

## 2012-11-19 DIAGNOSIS — I2510 Atherosclerotic heart disease of native coronary artery without angina pectoris: Secondary | ICD-10-CM | POA: Insufficient documentation

## 2012-11-19 DIAGNOSIS — Z853 Personal history of malignant neoplasm of breast: Secondary | ICD-10-CM | POA: Insufficient documentation

## 2012-11-19 DIAGNOSIS — Z8679 Personal history of other diseases of the circulatory system: Secondary | ICD-10-CM | POA: Insufficient documentation

## 2012-11-19 DIAGNOSIS — Z9221 Personal history of antineoplastic chemotherapy: Secondary | ICD-10-CM | POA: Insufficient documentation

## 2012-11-19 DIAGNOSIS — M791 Myalgia, unspecified site: Secondary | ICD-10-CM

## 2012-11-19 DIAGNOSIS — R0602 Shortness of breath: Secondary | ICD-10-CM | POA: Insufficient documentation

## 2012-11-19 DIAGNOSIS — Z880 Allergy status to penicillin: Secondary | ICD-10-CM | POA: Insufficient documentation

## 2012-11-19 DIAGNOSIS — Z9861 Coronary angioplasty status: Secondary | ICD-10-CM | POA: Insufficient documentation

## 2012-11-19 DIAGNOSIS — R109 Unspecified abdominal pain: Secondary | ICD-10-CM | POA: Insufficient documentation

## 2012-11-19 DIAGNOSIS — D563 Thalassemia minor: Secondary | ICD-10-CM

## 2012-11-19 DIAGNOSIS — F3289 Other specified depressive episodes: Secondary | ICD-10-CM | POA: Insufficient documentation

## 2012-11-19 DIAGNOSIS — M25473 Effusion, unspecified ankle: Secondary | ICD-10-CM | POA: Insufficient documentation

## 2012-11-19 DIAGNOSIS — Z8719 Personal history of other diseases of the digestive system: Secondary | ICD-10-CM | POA: Insufficient documentation

## 2012-11-19 DIAGNOSIS — F450 Somatization disorder: Secondary | ICD-10-CM

## 2012-11-19 DIAGNOSIS — IMO0001 Reserved for inherently not codable concepts without codable children: Secondary | ICD-10-CM | POA: Insufficient documentation

## 2012-11-19 DIAGNOSIS — Z765 Malingerer [conscious simulation]: Secondary | ICD-10-CM

## 2012-11-19 DIAGNOSIS — M2550 Pain in unspecified joint: Secondary | ICD-10-CM | POA: Insufficient documentation

## 2012-11-19 DIAGNOSIS — F1910 Other psychoactive substance abuse, uncomplicated: Secondary | ICD-10-CM | POA: Insufficient documentation

## 2012-11-19 DIAGNOSIS — R509 Fever, unspecified: Secondary | ICD-10-CM | POA: Insufficient documentation

## 2012-11-19 DIAGNOSIS — I10 Essential (primary) hypertension: Secondary | ICD-10-CM | POA: Insufficient documentation

## 2012-11-19 DIAGNOSIS — F329 Major depressive disorder, single episode, unspecified: Secondary | ICD-10-CM | POA: Insufficient documentation

## 2012-11-19 HISTORY — DX: Atherosclerotic heart disease of native coronary artery without angina pectoris: I25.10

## 2012-11-19 LAB — CBC WITH DIFFERENTIAL/PLATELET
Basophils Absolute: 0 10*3/uL (ref 0.0–0.1)
Eosinophils Absolute: 0.1 10*3/uL (ref 0.0–0.7)
Eosinophils Relative: 1 % (ref 0–5)
Lymphs Abs: 2.6 10*3/uL (ref 0.7–4.0)
MCH: 22.8 pg — ABNORMAL LOW (ref 26.0–34.0)
MCHC: 32.8 g/dL (ref 30.0–36.0)
Monocytes Absolute: 0.5 10*3/uL (ref 0.1–1.0)
Neutrophils Relative %: 52 % (ref 43–77)
Platelets: 314 10*3/uL (ref 150–400)
RBC: 4.25 MIL/uL (ref 3.87–5.11)
RDW: 16.2 % — ABNORMAL HIGH (ref 11.5–15.5)

## 2012-11-19 LAB — URINALYSIS, ROUTINE W REFLEX MICROSCOPIC
Bilirubin Urine: NEGATIVE
Nitrite: NEGATIVE
Specific Gravity, Urine: 1.025 (ref 1.005–1.030)
pH: 7 (ref 5.0–8.0)

## 2012-11-19 LAB — COMPREHENSIVE METABOLIC PANEL
Alkaline Phosphatase: 62 U/L (ref 39–117)
Calcium: 9.5 mg/dL (ref 8.4–10.5)
Creatinine, Ser: 0.62 mg/dL (ref 0.50–1.10)
GFR calc Af Amer: >90 mL/min (ref >90)
GFR calc non Af Amer: >90 mL/min (ref >90)
Sodium: 140 mEq/L (ref 135–145)
Total Bilirubin: 0.3 mg/dL (ref 0.3–1.2)

## 2012-11-19 LAB — RETICULOCYTES: Retic Count, Absolute: 76.5 10*3/uL (ref 19.0–186.0)

## 2012-11-19 LAB — TROPONIN I: Troponin I: <0.3 ng/mL (ref <0.30)

## 2012-11-19 MED ORDER — SODIUM CHLORIDE 0.9 % IV SOLN
1000.0000 mL | Freq: Once | INTRAVENOUS | Status: AC
  Administered 2012-11-19: 1000 mL via INTRAVENOUS

## 2012-11-19 MED ORDER — PROMETHAZINE HCL 25 MG PO TABS: 25.0000 mg | ORAL_TABLET | Freq: Three times a day (TID) | ORAL | Status: DC | PRN

## 2012-11-19 MED ORDER — HEPARIN SOD (PORK) LOCK FLUSH 100 UNIT/ML IV SOLN
INTRAVENOUS | Status: AC
  Administered 2012-11-19
  Filled 2012-11-19: qty 5

## 2012-11-19 MED ORDER — SODIUM CHLORIDE 0.9 % IV SOLN: 1000.0000 mL | INTRAVENOUS | Status: DC

## 2012-11-19 MED ORDER — MORPHINE SULFATE 4 MG/ML IJ SOLN
4.0000 mg | Freq: Once | INTRAMUSCULAR | Status: AC
  Administered 2012-11-19: 4 mg via INTRAVENOUS
  Filled 2012-11-19: qty 1

## 2012-11-19 MED ORDER — MORPHINE SULFATE 4 MG/ML IJ SOLN
INTRAMUSCULAR | Status: AC
  Administered 2012-11-19: 4 mg
  Filled 2012-11-19: qty 1

## 2012-11-19 MED ORDER — CYCLOBENZAPRINE HCL 10 MG PO TABS: 10.0000 mg | ORAL_TABLET | Freq: Three times a day (TID) | ORAL | Status: DC | PRN

## 2012-11-19 MED ORDER — PROMETHAZINE HCL 25 MG/ML IJ SOLN
12.5000 mg | Freq: Once | INTRAMUSCULAR | Status: AC
  Administered 2012-11-19: 12.5 mg via INTRAVENOUS
  Filled 2012-11-19: qty 1

## 2012-11-19 MED ORDER — DIPHENHYDRAMINE HCL 50 MG/ML IJ SOLN
25.0000 mg | Freq: Once | INTRAMUSCULAR | Status: AC
  Administered 2012-11-19: 25 mg via INTRAVENOUS
  Filled 2012-11-19: qty 1

## 2012-11-19 NOTE — ED Notes (Signed)
State she started having chest pain and SOB about 30 minutes ago.  States she feels like she is in sickle cell crisis.

## 2012-11-19 NOTE — ED Notes (Signed)
The patient states that she is having nausea and vomiting as well.

## 2012-11-19 NOTE — ED Provider Notes (Signed)
History  This chart was scribed for [name], MD by Ardelia Mems, ED Scribe. This patient was seen in room APA06/APA06 and the patient's care was started at 4:55 PM.  CSN: [id number]  Arrival date & time 11/19/12  1636   Chief Complaint  Patient presents with  . Chest Pain  . Shortness of Breath  . Sickle Cell Pain Crisis    The history is provided by the patient. No language interpreter was used.   HPI Comments: [name] is a 47 y.o. Female with a history of coronary artery disease, breast cancer and sickle beta thalassemia trait who presents to the Emergency Department complaining of 1 week of left flank pain with associated urinary frequency, but no dysuria.  She has never had this pain before and she denies history of kidney stones. Pt also reports a fever of 100.1 F 2 days ago obtained by her HHN. ED temperature is 98.8 F. Nothing makes the pain worse or better.   She describes 2 days of joint pain with nausea and vomiting which is c/w her diagnosis of sickle beta thalassemia crisis. She denies diarrhea. Pt states that her joint pain is located in her knees, ankles and arms. She attributes her joint pain, as well as nausea and multiple episodes of emesis to her sickle beta thalessemia trait.   Pt's breast cancer was diagnosed in December 2013 and has not metastasized to her knowledge. She had it start in her right breast and it recurred in her right breast so she is on her second round of chemotherapy. She is receiving chemotherapy twice monthly from Dr. Ambrose Mantle in [place], who she sees for her sickle cell and cancer. She was to have chemotherapy today, but it has been postponed for 2 days b/o her symptoms today.   She describes  emergently, of 30 minutes of constant, severe "8/10" left-sided chest pain described as pressure with associated SOB. Pt states that she had a cardiac stent placement performed in 2002 after a blockage was found, but she denies hx of MI. She states  that since 2002, she has had very few episodes of mild chest pain until her current episode. She states that her chest pain was about "9/10" at onset, and she reports taking 2 of her prescribed Nitros with mild relief, to her current "8/10" pain. She states that walking worsens her chest pain. She reports associated SOB over the last hour, but denies cough as an associated symptom. Pt states that she had  chest pain about 2 months ago and was admitted and had negative enzymes and the pain was similar to today. She states the pain is different from her sickle beta thalassemia crisis chest pain.  She states that her most recent cardiac stress test was performed in [place] in 2014, by her cardiologist, Dr. Earna Coder, and it resulted normally. Pt reports mild swelling, bilaterally in her ankles, which is a long term problem.  She denies calf pain or hx of blood clots. Pt is on Lasix as prescribed by Dr. Charlynne Cousins, her PCP, and had a recent dosage change from 80mg  to 20 mg since the higher dosage was draining her potassium and affecting her kidneys. . Pt is a former smoker who quit about 2 months ago, she denies alcohol use and lives with a roommate. She denies dysuria, diarrhea, abdominal pain, wheezing or any other symptoms.  PCP- Dr. Jacquiline Doe in [facility] Cardiologist Dr Earna Coder in [facility] Oncologist/ hematologist Dr Ambrose Mantle in [place], [place]  Past Medical History  Diagnosis Date  . Somatization disorder   . Depression   . Anxiety   . Beta thalassemia trait   . CVA (cerebral infarction)   . Pancreatitis   . Breast cancer   . Hypertension   . Coronary artery disease    Past Surgical History  Procedure Laterality Date  . Tubal ligation    . Cesarean section    . Partial hysterectomy    . Cardiac catheterization    . Breast lumpectomy     Family History  Problem Relation Age of Onset  . Diabetes Mother   . Diabetes Father   . Hyperlipidemia Mother   . Hyperlipidemia Father   .  Hypertension Mother   . Hypertension Father    History  Substance Use Topics  . Smoking status: Former Smoker -- 0.50 packs/day    Types: Cigarettes    Quit date: 10/11/2012  . Smokeless tobacco: Not on file  . Alcohol Use: No  lives at home Lives with roommate   OB History   Grav Para Term Preterm Abortions TAB SAB Ect Mult Living                 Review of Systems  Constitutional: Positive for fever.  Respiratory: Positive for shortness of breath. Negative for cough and wheezing.   Cardiovascular: Positive for chest pain and leg swelling (ankles).  Gastrointestinal: Positive for nausea and vomiting.  Genitourinary: Positive for frequency and flank pain (left). Negative for dysuria.  Musculoskeletal: Positive for arthralgias.  All other systems reviewed and are negative.    Allergies  Latex; Metoclopramide hcl; Penicillins; Fentanyl; Hydromorphone hcl; Iohexol; Ketorolac tromethamine; Ondansetron hcl; and Prochlorperazine edisylate  Home Medications   Current Outpatient Rx  Name  Route  Sig  Dispense  Refill  . albuterol (PROVENTIL HFA;VENTOLIN HFA) 108 (90 BASE) MCG/ACT inhaler   Inhalation   Inhale 2 puffs into the lungs every 8 (eight) hours as needed for shortness of breath.         . ALPRAZolam (XANAX) 1 MG tablet   Oral   Take 1 mg by mouth at bedtime as needed for anxiety.         Marland Kitchen amitriptyline (ELAVIL) 25 MG tablet   Oral   Take 25-50 mg by mouth at bedtime as needed (for mood or imsomnia).         Marland Kitchen amLODipine (NORVASC) 10 MG tablet   Oral   Take 10 mg by mouth 2 (two) times daily.         . ciprofloxacin (CIPRO) 500 MG tablet   Oral   Take 500 mg by mouth daily.         . clarithromycin (BIAXIN) 500 MG tablet   Oral   Take 500 mg by mouth 2 (two) times daily.         . clopidogrel (PLAVIX) 75 MG tablet   Oral   Take 75 mg by mouth daily.         Marland Kitchen docusate sodium (COLACE) 100 MG capsule   Oral   Take 100 mg by mouth daily.          . folic acid (FOLVITE) 1 MG tablet   Oral   Take 1 mg by mouth daily.         . furosemide (LASIX) 80 MG tablet   Oral   Take 80 mg by mouth every morning.         . hydroxyurea (HYDREA) 500  MG capsule   Oral   Take 500 mg by mouth 2 (two) times daily. May take with food to minimize GI side effects.         Marland Kitchen lisinopril (PRINIVIL,ZESTRIL) 10 MG tablet   Oral   Take 10 mg by mouth every morning.         . mirtazapine (REMERON) 15 MG tablet   Oral   Take 15 mg by mouth at bedtime as needed (for mood, appetite and insomnia).         . nicotine (NICODERM CQ - DOSED IN MG/24 HOURS) 21 mg/24hr patch   Transdermal   Place 1 patch onto the skin daily as needed (for nicotine cravings).         . nitroGLYCERIN (NITROSTAT) 0.4 MG SL tablet   Sublingual   Place 0.4 mg under the tongue every 5 (five) minutes as needed for chest pain.         Marland Kitchen omeprazole (PRILOSEC) 20 MG capsule   Oral   Take 20 mg by mouth 2 (two) times daily.         . potassium chloride SA (K-DUR,KLOR-CON) 20 MEQ tablet   Oral   Take 20 mEq by mouth daily.         . promethazine (PHENERGAN) 25 MG tablet   Oral   Take 25 mg by mouth every 8 (eight) hours as needed for nausea.         . promethazine (PHENERGAN) 25 MG tablet   Oral   Take 1 tablet (25 mg total) by mouth every 8 (eight) hours as needed for nausea.   8 tablet   0   . tiZANidine (ZANAFLEX) 4 MG tablet   Oral   Take 4 mg by mouth every 8 (eight) hours as needed (for muscle pains/cramps).         . venlafaxine XR (EFFEXOR-XR) 75 MG 24 hr capsule   Oral   Take 75 mg by mouth daily.         Marland Kitchen zolpidem (AMBIEN) 10 MG tablet   Oral   Take 10 mg by mouth at bedtime as needed for sleep.          Triage Vitals: BP 122/64  Pulse 95  Temp(Src) 98.8 F (37.1 C) (Oral)  Resp 18  Wt 202 lb (91.627 kg)  BMI 34.66 kg/m2  SpO2 98%  Vital signs normal    Physical Exam  Nursing note and vitals  reviewed. Constitutional: She is oriented to person, place, and time. She appears well-developed and well-nourished.  Non-toxic appearance. She does not appear ill. No distress.  HENT:  Head: Normocephalic and atraumatic.  Right Ear: External ear normal.  Left Ear: External ear normal.  Nose: Nose normal. No mucosal edema or rhinorrhea.  Mouth/Throat: Mucous membranes are normal. No dental abscesses or edematous.  Tongue was dry.  Eyes: Conjunctivae and EOM are normal. Pupils are equal, round, and reactive to light.  Neck: Normal range of motion and full passive range of motion without pain. Neck supple.  Cardiovascular: Normal rate, regular rhythm and normal heart sounds.  Exam reveals no gallop and no friction rub.   No murmur heard. Pulmonary/Chest: Effort normal and breath sounds normal. No respiratory distress. She has no wheezes. She has no rhonchi. She has no rales. She exhibits tenderness. She exhibits no crepitus.    She had tenderness in her left anterior chest wall.  Abdominal: Soft. Normal appearance and bowel sounds are normal. She exhibits  no distension. There is no tenderness. There is no rebound and no guarding.  She was tender in her left flank.  Musculoskeletal: Normal range of motion. She exhibits no edema and no tenderness.  Moves all extremities well. She was non-tender in her calves bilaterally.  Neurological: She is alert and oriented to person, place, and time. She has normal strength. No cranial nerve deficit.  Skin: Skin is warm, dry and intact. No rash noted. No erythema. No pallor.  Psychiatric: She has a normal mood and affect. Her speech is normal and behavior is normal. Her mood appears not anxious.    ED Course  Procedures (including critical care time)  Medications  0.9 %  sodium chloride infusion (0 mLs Intravenous Stopped 11/19/12 1927)    Followed by  0.9 %  sodium chloride infusion (not administered)  heparin lock flush 100 UNIT/ML injection (not  administered)  morphine 4 MG/ML injection 4 mg (4 mg Intravenous Given 11/19/12 1800)  diphenhydrAMINE (BENADRYL) injection 25 mg (25 mg Intravenous Given 11/19/12 1800)  promethazine (PHENERGAN) injection 12.5 mg (12.5 mg Intravenous Given 11/19/12 1926)  morphine 4 MG/ML injection (4 mg  Given 11/19/12 1926)    DIAGNOSTIC STUDIES: Oxygen Saturation is 98% on RA, normal by my interpretation.    COORDINATION OF CARE: 4:56 PM- Pt advised of plan to receive diagnostic radiology and lab work, as well as IV fluids, Morphine and Benadryl and pt agrees.  6:51 PM- Recheck with pt and pt states that none of her symptoms have changed or since receiving medications. Pt is requesting Phenergan for nausea. Pt now states that she has a history of kidney stones, last one being this year. Pt advised of plan for ongoing studies and pt agrees.  10:33 PM- Recheck with pt and pt is only reporting mild relief of pain after all ED medications. Pt asked if she thinks she needs to be admitted and pt is unsure.  [facility] scans from [facility] obtained (pt couldn't remember if she had the CT scan done in [place] or [place]). Shows she had a ? 1 mm stone on the left distal ureter in April 2014, however a repeat CT scan done in May was normal.   Review of her prior admissions at [facility], and in her prior laboratory results patient does not have sickle beta thalassemia. She only has been a thalassemia trait. Patient says she has beta thalassemia trait with crisis. However her prior diagnosis has been somatizations disorder. On review of her narcotic databases in both the [place] and [place] this patient has seen to have prescriptions from multiple physicians in multiple cities.  Review of [facility] site shows she has gotten #20 oxycodone 5 mg tablets from the [facility] [place] hospital in [place] on 6/25, #30 oxycodone 15 mg tablets from a physician in [place] on July 3. In the [place] database she also received #60  oxycodone 5 mg tablets on July 8 from a doctor in [place], her PCP. She also received #60 oxycodone 15 mg tablets on June 10 from her PCP in [place] and monthly before that. She also received oxycodone prescriptions from [facility] in [place], a physician in [place], [place], hydrocodone from [place] and [place] in the past year.   Blood Smear review Tech Review  Absolute lymphocytosis. Suggest immunophenotyping if a new, persistent finding. MILD ANEMIA WITH MICROCYTOSIS BUT GENERALLY NORMOCHROMIC. fFAVOR IRON DEFICIENCY VS. ANEMIA CHRONIC DISEASE VS. BETA THAL. Reviewed by Coralyn Pear, M.D. 04/28/09  Resulting  Agency [employer]   Specimen Collected: 04/27/09 9:30 PM Last Resulted: 07/31/10 5:17 PM     Hgb A2 Quant 5.3 (NOTE) Elevated A2 levels can be seen in Beta Thalassemia Trait and a variety of unstable hemoglobin variants.  Elevations can also be seen in acquired conditions such as leukemias, megalobloastic anemia and hyperthyroidism.  Accurate interpretation of these results should include family history, hemoglobin, hematocrit, and mean corpuscular volume results, red cell morphology, serum iron binding capacity, knowledge of recent transfusions and other pertinent clinical findings.   Reviewed by Dallas Breeding, MD, PhD, FCAP (Electronic Signature on File) (H)   2.2 - 3.2 %  Hgb F Quant 2.0 (NOTE)  Patient State                  HbA2 Level         HbF Level ----------------------------------------------------------- Heterozygous B-thalassemia       4-9%               1-5% Homozygous B-thalassemia    Normal or increased    80-100%  Heterozygous HPFH             Less than 1.5%        10-20% Homozygous HPFH                  Absent             100% (H)   0.0 - 2.0 %  Hgb S Quant 0.0    0.0 - 0.0 %  Hgb A 92.7 (L)    96.8 - 97.8 %  Hemoglobin Other 0.0    0.0 - 0.0 %      Specimen Collected: 08/26/08  3:00 PM Last Resulted:  08/09/10 11:43 AM      Results for orders placed during the hospital encounter of 11/19/12  CBC WITH DIFFERENTIAL      Result Value Range   WBC 6.7  4.0 - 10.5 K/uL   RBC 4.25  3.87 - 5.11 MIL/uL   Hemoglobin 9.7 (*) 12.0 - 15.0 g/dL   HCT 40.9 (*) 81.1 - 91.4 %   MCV 69.6 (*) 78.0 - 100.0 fL   MCH 22.8 (*) 26.0 - 34.0 pg   MCHC 32.8  30.0 - 36.0 g/dL   RDW 78.2 (*) 95.6 - 21.3 %   Platelets 314  150 - 400 K/uL   Neutrophils Relative % 52  43 - 77 %   Lymphocytes Relative 39  12 - 46 %   Monocytes Relative 8  3 - 12 %   Eosinophils Relative 1  0 - 5 %   Basophils Relative 0  0 - 1 %   Neutro Abs 3.5  1.7 - 7.7 K/uL   Lymphs Abs 2.6  0.7 - 4.0 K/uL   Monocytes Absolute 0.5  0.1 - 1.0 K/uL   Eosinophils Absolute 0.1  0.0 - 0.7 K/uL   Basophils Absolute 0.0  0.0 - 0.1 K/uL   RBC Morphology TEARDROP CELLS    COMPREHENSIVE METABOLIC PANEL      Result Value Range   Sodium 140  135 - 145 mEq/L   Potassium 3.4 (*) 3.5 - 5.1 mEq/L   Chloride 106  96 - 112 mEq/L   CO2 26  19 - 32 mEq/L   Glucose, Bld 71  70 - 99 mg/dL   BUN 10  6 - 23 mg/dL   Creatinine, Ser 0.86  0.50 - 1.10 mg/dL   Calcium  9.5  8.4 - 10.5 mg/dL   Total Protein 7.3  6.0 - 8.3 g/dL   Albumin 3.6  3.5 - 5.2 g/dL   AST 15  0 - 37 U/L   ALT 17  0 - 35 U/L   Alkaline Phosphatase 62  39 - 117 U/L   Total Bilirubin 0.3  0.3 - 1.2 mg/dL   GFR calc non Af Amer >90  >90 mL/min   GFR calc Af Amer >90  >90 mL/min  TROPONIN I      Result Value Range   Troponin I <0.30  <0.30 ng/mL  D-DIMER, QUANTITATIVE      Result Value Range   D-Dimer, Quant <0.27  0.00 - 0.48 ug/mL-FEU  RETICULOCYTES      Result Value Range   Retic Ct Pct 1.8  0.4 - 3.1 %   RBC. 4.25  3.87 - 5.11 MIL/uL   Retic Count, Manual 76.5  19.0 - 186.0 K/uL  URINALYSIS, ROUTINE W REFLEX MICROSCOPIC      Result Value Range   Color, Urine YELLOW  YELLOW   APPearance CLEAR  CLEAR   Specific Gravity, Urine 1.025  1.005 - 1.030   pH 7.0  5.0 - 8.0    Glucose, UA NEGATIVE  NEGATIVE mg/dL   Hgb urine dipstick TRACE (*) NEGATIVE   Bilirubin Urine NEGATIVE  NEGATIVE   Ketones, ur NEGATIVE  NEGATIVE mg/dL   Protein, ur NEGATIVE  NEGATIVE mg/dL   Urobilinogen, UA 0.2  0.0 - 1.0 mg/dL   Nitrite NEGATIVE  NEGATIVE   Leukocytes, UA NEGATIVE  NEGATIVE  URINE MICROSCOPIC-ADD ON      Result Value Range   RBC / HPF 0-2  <3 RBC/hpf   Laboratory interpretation all normal except anemia that is stable.   Dg Chest 2 View  11/19/2012   *RADIOLOGY REPORT*  Clinical Data: Chest pain with shortness of breath.  Sickle cell crisis.  CHEST - 2 VIEW  Comparison: Acute abdominal series 11/13/2012.  Findings: Right IJ Port-A-Cath tip is unchanged in the mid SVC. Heart size and mediastinal contours are stable.  The lungs appear stable with mild basilar atelectasis.  There is no airspace disease or pleural effusion.  No acute osseous findings are seen.  IMPRESSION: Stable chest.  No acute cardiopulmonary process.   Original Report Authenticated By: Carey Bullocks, M.D.   Dg Abd Acute W/chest 11/13/2012   IMPRESSION: Mild constipation.  Negative for bowel obstruction.  No acute cardiopulmonary abnormality.      Date: 11/19/2012  Rate: 95  Rhythm: normal sinus rhythm and sinus arrhythmia  QRS Axis: normal  Intervals: normal  ST/T Wave abnormalities: normal  Conduction Disutrbances:none  Narrative Interpretation: LAE  Old EKG Reviewed: changes noted from 08/09/2009 HR was 103   Dg Chest 2 View  11/19/2012   *RADIOLOGY REPORT*  Clinical Data: Chest pain with shortness of breath.  Sickle cell crisis.  CHEST - 2 VIEW  Comparison: Acute abdominal series 11/13/2012.  Findings: Right IJ Port-A-Cath tip is unchanged in the mid SVC. Heart size and mediastinal contours are stable.  The lungs appear stable with mild basilar atelectasis.  There is no airspace disease or pleural effusion.  No acute osseous findings are seen.  IMPRESSION: Stable chest.  No acute  cardiopulmonary process.   Original Report Authenticated By: Carey Bullocks, M.D.    1. Myalgia   2. Beta thalassemia trait   3. Atypical chest pain   4. Depression with somatization    New Prescriptions  CYCLOBENZAPRINE (FLEXERIL) 10 MG TABLET    Take 1 tablet (10 mg total) by mouth 3 (three) times daily as needed for muscle spasms.   PROMETHAZINE (PHENERGAN) 25 MG TABLET    Take 1 tablet (25 mg total) by mouth every 8 (eight) hours as needed for nausea.     Plan discharge   Devoria Albe, MD, FACEP  MDM    I personally performed the services described in this documentation, which was scribed in my presence. The recorded information has been reviewed and considered.  Devoria Albe, MD, Armando Gang    [name], MD 11/19/12 [phone]

## 2012-11-26 DIAGNOSIS — R079 Chest pain, unspecified: Secondary | ICD-10-CM

## 2012-12-10 ENCOUNTER — Emergency Department (HOSPITAL_COMMUNITY): Payer: Medicaid - Out of State

## 2012-12-10 ENCOUNTER — Emergency Department (HOSPITAL_COMMUNITY)
Admission: EM | Admit: 2012-12-10 | Discharge: 2012-12-11 | Disposition: A | Discharge status: Home / Self Care | Payer: Medicaid - Out of State | Attending: Emergency Medicine | Admitting: Emergency Medicine

## 2012-12-10 ENCOUNTER — Encounter (HOSPITAL_COMMUNITY): Payer: Self-pay

## 2012-12-10 DIAGNOSIS — F450 Somatization disorder: Secondary | ICD-10-CM | POA: Insufficient documentation

## 2012-12-10 DIAGNOSIS — Z8673 Personal history of transient ischemic attack (TIA), and cerebral infarction without residual deficits: Secondary | ICD-10-CM | POA: Insufficient documentation

## 2012-12-10 DIAGNOSIS — Z8719 Personal history of other diseases of the digestive system: Secondary | ICD-10-CM | POA: Insufficient documentation

## 2012-12-10 DIAGNOSIS — F329 Major depressive disorder, single episode, unspecified: Secondary | ICD-10-CM | POA: Insufficient documentation

## 2012-12-10 DIAGNOSIS — Z87891 Personal history of nicotine dependence: Secondary | ICD-10-CM | POA: Insufficient documentation

## 2012-12-10 DIAGNOSIS — F3289 Other specified depressive episodes: Secondary | ICD-10-CM | POA: Insufficient documentation

## 2012-12-10 DIAGNOSIS — R1032 Left lower quadrant pain: Secondary | ICD-10-CM | POA: Insufficient documentation

## 2012-12-10 DIAGNOSIS — I10 Essential (primary) hypertension: Secondary | ICD-10-CM | POA: Insufficient documentation

## 2012-12-10 DIAGNOSIS — Z91040 Latex allergy status: Secondary | ICD-10-CM | POA: Insufficient documentation

## 2012-12-10 DIAGNOSIS — R079 Chest pain, unspecified: Secondary | ICD-10-CM | POA: Insufficient documentation

## 2012-12-10 DIAGNOSIS — F411 Generalized anxiety disorder: Secondary | ICD-10-CM | POA: Insufficient documentation

## 2012-12-10 DIAGNOSIS — I2510 Atherosclerotic heart disease of native coronary artery without angina pectoris: Secondary | ICD-10-CM | POA: Insufficient documentation

## 2012-12-10 DIAGNOSIS — Z79899 Other long term (current) drug therapy: Secondary | ICD-10-CM | POA: Insufficient documentation

## 2012-12-10 DIAGNOSIS — Z9861 Coronary angioplasty status: Secondary | ICD-10-CM | POA: Insufficient documentation

## 2012-12-10 DIAGNOSIS — Z880 Allergy status to penicillin: Secondary | ICD-10-CM | POA: Insufficient documentation

## 2012-12-10 DIAGNOSIS — Z853 Personal history of malignant neoplasm of breast: Secondary | ICD-10-CM | POA: Insufficient documentation

## 2012-12-10 DIAGNOSIS — R112 Nausea with vomiting, unspecified: Secondary | ICD-10-CM | POA: Insufficient documentation

## 2012-12-10 DIAGNOSIS — Z862 Personal history of diseases of the blood and blood-forming organs and certain disorders involving the immune mechanism: Secondary | ICD-10-CM | POA: Insufficient documentation

## 2012-12-10 LAB — BASIC METABOLIC PANEL
CO2: 27 mEq/L (ref 19–32)
Chloride: 102 mEq/L (ref 96–112)
Potassium: 3.4 mEq/L — ABNORMAL LOW (ref 3.5–5.1)
Sodium: 138 mEq/L (ref 135–145)

## 2012-12-10 LAB — HEPATIC FUNCTION PANEL
Alkaline Phosphatase: 67 U/L (ref 39–117)
Total Bilirubin: 0.4 mg/dL (ref 0.3–1.2)
Total Protein: 7.3 g/dL (ref 6.0–8.3)

## 2012-12-10 LAB — CBC WITH DIFFERENTIAL/PLATELET
Eosinophils Relative: 1 % (ref 0–5)
Lymphs Abs: 3.5 10*3/uL (ref 0.7–4.0)
MCV: 69.9 fL — ABNORMAL LOW (ref 78.0–100.0)
Monocytes Relative: 7 % (ref 3–12)
Neutrophils Relative %: 50 % (ref 43–77)
Platelets: 372 10*3/uL (ref 150–400)
RBC: 4.32 MIL/uL (ref 3.87–5.11)
WBC: 8.3 10*3/uL (ref 4.0–10.5)

## 2012-12-10 LAB — LIPASE, BLOOD: Lipase: 42 U/L (ref 11–59)

## 2012-12-10 LAB — TROPONIN I: Troponin I: <0.3 ng/mL (ref <0.30)

## 2012-12-10 MED ORDER — MORPHINE SULFATE 4 MG/ML IJ SOLN
4.0000 mg | Freq: Once | INTRAMUSCULAR | Status: AC
  Administered 2012-12-10: 4 mg via INTRAVENOUS
  Filled 2012-12-10: qty 1

## 2012-12-10 MED ORDER — OXYCODONE-ACETAMINOPHEN 5-325 MG PO TABS: 1.0000 | ORAL_TABLET | ORAL | Status: DC | PRN

## 2012-12-10 MED ORDER — PROMETHAZINE HCL 25 MG/ML IJ SOLN
25.0000 mg | Freq: Once | INTRAMUSCULAR | Status: AC
  Administered 2012-12-10: 25 mg via INTRAMUSCULAR
  Filled 2012-12-10: qty 1

## 2012-12-10 MED ORDER — DIPHENHYDRAMINE HCL 50 MG/ML IJ SOLN
25.0000 mg | Freq: Once | INTRAMUSCULAR | Status: AC
  Administered 2012-12-10: 25 mg via INTRAVENOUS
  Filled 2012-12-10: qty 1

## 2012-12-10 MED ORDER — ONDANSETRON HCL 4 MG/2ML IJ SOLN
4.0000 mg | Freq: Once | INTRAMUSCULAR | Status: DC
  Filled 2012-12-10: qty 2

## 2012-12-10 MED ORDER — SODIUM CHLORIDE 0.9 % IV BOLUS (SEPSIS)
1000.0000 mL | Freq: Once | INTRAVENOUS | Status: AC
  Administered 2012-12-10: 1000 mL via INTRAVENOUS

## 2012-12-10 NOTE — ED Provider Notes (Signed)
CSN: [id number]     Arrival date & time 12/10/12  1819 History     First MD Initiated Contact with Patient 12/10/12 1922     Chief Complaint  Patient presents with  . Chest Pain   (Consider location/radiation/quality/duration/timing/severity/associated sxs/prior Treatment) HPI Comments: Just d/c'd from [facility] for diverticulitis, feels no better, has had multiple episodes of n/v.  She has been compliant with cipro/flagyl and has followed up with PCP as outpt. Also began having CP this afternoon, with hx of episodic CP over last month.  Pt recently found out she has new breast lesion in L breast and will needs double mastectomy.    Patient is a 47 y.o. female presenting with abdominal pain. The history is provided by the patient. No language interpreter was used.  Abdominal Pain Pain location:  LLQ Pain quality: sharp   Pain radiates to:  Does not radiate Pain severity:  Moderate Duration:  5 days Timing:  Constant Progression:  Unchanged Chronicity:  New Context comment:  Recently diagnosed diverticulitis at [facility] Relieved by:  Nothing Worsened by:  Nothing tried Ineffective treatments: narcotics. Associated symptoms: chest pain, nausea and vomiting   Associated symptoms: no chills, no constipation, no cough, no diarrhea, no dysuria, no fatigue, no fever, no flatus, no shortness of breath and no sore throat   Chest pain:    Quality:  Sharp   Severity:  Moderate   Onset quality:  Sudden   Duration:  2 hours   Timing:  Constant   Progression:  Unchanged   Chronicity:  Recurrent (intermittent L sided CP for about 1 month) Risk factors: recent hospitalization     Past Medical History  Diagnosis Date  . Somatization disorder   . Depression   . Anxiety   . Beta thalassemia trait   . CVA (cerebral infarction)   . Pancreatitis   . Breast cancer   . Hypertension   . Coronary artery disease    Past Surgical History  Procedure Laterality Date  . Tubal ligation    .  Cesarean section    . Partial hysterectomy    . Cardiac catheterization    . Breast lumpectomy     Family History  Problem Relation Age of Onset  . Diabetes Mother   . Diabetes Father   . Hyperlipidemia Mother   . Hyperlipidemia Father   . Hypertension Mother   . Hypertension Father    History  Substance Use Topics  . Smoking status: Former Smoker -- 0.50 packs/day    Types: Cigarettes    Quit date: 10/11/2012  . Smokeless tobacco: Not on file  . Alcohol Use: No   OB History   Grav Para Term Preterm Abortions TAB SAB Ect Mult Living                 Review of Systems  Constitutional: Negative for fever, chills, diaphoresis, activity change, appetite change and fatigue.  HENT: Negative for congestion, sore throat, facial swelling, rhinorrhea, neck pain and neck stiffness.   Eyes: Negative for photophobia and discharge.  Respiratory: Negative for cough, chest tightness and shortness of breath.   Cardiovascular: Positive for chest pain. Negative for palpitations and leg swelling.  Gastrointestinal: Positive for nausea, vomiting and abdominal pain. Negative for diarrhea, constipation and flatus.  Endocrine: Negative for polydipsia and polyuria.  Genitourinary: Negative for dysuria, frequency, difficulty urinating and pelvic pain.  Musculoskeletal: Negative for back pain and arthralgias.  Skin: Negative for color change and wound.  Allergic/Immunologic:  Negative for immunocompromised state.  Neurological: Negative for facial asymmetry, weakness, numbness and headaches.  Hematological: Does not bruise/bleed easily.  Psychiatric/Behavioral: Negative for confusion and agitation.    Allergies  Latex; Metoclopramide hcl; Penicillins; Fentanyl; Hydromorphone hcl; Iohexol; Ketorolac tromethamine; Ondansetron hcl; and Prochlorperazine edisylate  Home Medications   Current Outpatient Rx  Name  Route  Sig  Dispense  Refill  . albuterol (PROVENTIL HFA;VENTOLIN HFA) 108 (90 BASE)  MCG/ACT inhaler   Inhalation   Inhale 2 puffs into the lungs every 8 (eight) hours as needed for shortness of breath.         . ALPRAZolam (XANAX) 1 MG tablet   Oral   Take 1 mg by mouth 3 (three) times daily as needed for anxiety.          Marland Kitchen amitriptyline (ELAVIL) 25 MG tablet   Oral   Take 25-50 mg by mouth at bedtime as needed (for mood or imsomnia).         Marland Kitchen amLODipine (NORVASC) 10 MG tablet   Oral   Take 10 mg by mouth 2 (two) times daily.         . ciprofloxacin (CIPRO) 500 MG tablet   Oral   Take 500 mg by mouth daily.         . clopidogrel (PLAVIX) 75 MG tablet   Oral   Take 75 mg by mouth daily.         . cyclobenzaprine (FLEXERIL) 10 MG tablet   Oral   Take 1 tablet (10 mg total) by mouth 3 (three) times daily as needed for muscle spasms.   30 tablet   0   . docusate sodium (COLACE) 100 MG capsule   Oral   Take 100 mg by mouth daily.         . folic acid (FOLVITE) 1 MG tablet   Oral   Take 1 mg by mouth daily.         . furosemide (LASIX) 20 MG tablet   Oral   Take 20 mg by mouth daily.         . hydroxyurea (HYDREA) 500 MG capsule   Oral   Take 500 mg by mouth 2 (two) times daily. May take with food to minimize GI side effects.         Marland Kitchen lisinopril (PRINIVIL,ZESTRIL) 10 MG tablet   Oral   Take 10 mg by mouth every morning.         . mirtazapine (REMERON) 15 MG tablet   Oral   Take 15 mg by mouth at bedtime as needed (for mood, appetite and insomnia).         . nitroGLYCERIN (NITROSTAT) 0.4 MG SL tablet   Sublingual   Place 0.4 mg under the tongue every 5 (five) minutes as needed for chest pain.         Marland Kitchen omeprazole (PRILOSEC) 20 MG capsule   Oral   Take 20 mg by mouth 2 (two) times daily.         Marland Kitchen oxyCODONE-acetaminophen (PERCOCET) 5-325 MG per tablet   Oral   Take 1 tablet by mouth every 4 (four) hours as needed for pain.   6 tablet   0   . potassium chloride SA (K-DUR,KLOR-CON) 20 MEQ tablet   Oral   Take  20 mEq by mouth daily.         . promethazine (PHENERGAN) 25 MG tablet   Oral   Take 1 tablet (25 mg  total) by mouth every 8 (eight) hours as needed for nausea.   8 tablet   0   . promethazine (PHENERGAN) 25 MG tablet   Oral   Take 1 tablet (25 mg total) by mouth every 8 (eight) hours as needed for nausea.   15 tablet   0   . tiZANidine (ZANAFLEX) 4 MG tablet   Oral   Take 4 mg by mouth every 8 (eight) hours as needed (for muscle pains/cramps).         . venlafaxine XR (EFFEXOR-XR) 75 MG 24 hr capsule   Oral   Take 75 mg by mouth daily.         Marland Kitchen zolpidem (AMBIEN) 10 MG tablet   Oral   Take 10 mg by mouth at bedtime as needed for sleep.          BP 102/62  Pulse 73  Temp(Src) 99.9 F (37.7 C) (Oral)  Resp 20  Ht 5\' 4"  (1.626 m)  Wt 210 lb (95.255 kg)  BMI 36.03 kg/m2  SpO2 100% Physical Exam  Constitutional: She is oriented to person, place, and time. She appears well-developed and well-nourished. No distress.  HENT:  Head: Normocephalic and atraumatic.  Mouth/Throat: No oropharyngeal exudate.  Eyes: Pupils are equal, round, and reactive to light.  Neck: Normal range of motion. Neck supple.  Cardiovascular: Normal rate, regular rhythm and normal heart sounds.  Exam reveals no gallop and no friction rub.   No murmur heard. Pulmonary/Chest: Effort normal and breath sounds normal. No respiratory distress. She has no wheezes. She has no rales.  Abdominal: Soft. Bowel sounds are normal. She exhibits no distension and no mass. There is tenderness in the left lower quadrant. There is no rigidity, no rebound and no guarding.  Musculoskeletal: Normal range of motion. She exhibits no edema and no tenderness.  Neurological: She is alert and oriented to person, place, and time.  Skin: Skin is warm and dry.  Psychiatric: She has a normal mood and affect.    ED Course   Procedures (including critical care time)  Labs Reviewed  CBC WITH DIFFERENTIAL - Abnormal;  Notable for the following:    Hemoglobin 9.8 (*)    HCT 30.2 (*)    MCV 69.9 (*)    MCH 22.7 (*)    RDW 16.1 (*)    All other components within normal limits  BASIC METABOLIC PANEL - Abnormal; Notable for the following:    Potassium 3.4 (*)    All other components within normal limits  TROPONIN I  HEPATIC FUNCTION PANEL  LIPASE, BLOOD  TROPONIN I    Date: 12/10/2012  Rate: 88  Rhythm: normal sinus rhythm  QRS Axis: normal  Intervals: possible R atrial abnormality  ST/T Wave abnormalities: normal  Conduction Disutrbances:none  Narrative Interpretation:   Old EKG Reviewed: unchanged    Ct Abdomen Pelvis Wo Contrast  12/10/2012   *RADIOLOGY REPORT*  Clinical Data: Left lower quadrant pain.  The patient reportedly had a similar prior exam 12/05/2012 at [facility] which is not available for comparison and was said to have demonstrated diverticulitis.  CT ABDOMEN AND PELVIS WITHOUT CONTRAST  Technique:  Multidetector CT imaging of the abdomen and pelvis was performed following the standard protocol without intravenous contrast.  Comparison: The recently performed outside prior study is not available for comparison, nor is a report.  Most recent similar prior exam of this institution 08/13/2012.  Findings: Minimal subpleural scarring or atelectasis.  Lung bases otherwise  clear.  Unenhanced liver, gallbladder, adrenal glands, kidneys, spleen, and pancreas are unremarkable.  No ascites or lymphadenopathy.  No free air.  Evidence of prior appendectomy.  Uterus and ovaries not visualized. No adnexal mass.  No bowel wall thickening or focal segmental dilatation identified.  Bilateral L5 pars interarticularis defects. No acute osseous abnormality.  Unenhanced CT was performed per clinician order.  Lack of IV contrast limits sensitivity and specificity, especially for evaluation of abdominal/pelvic solid viscera.  The patient reports a contrast allergy but no further details are not available to  determine whether she is a candidate for premedication.  IMPRESSION: No acute intra-abdominal or pelvic pathology.   Original Report Authenticated By: Christiana Pellant, M.D.   Dg Chest Portable 1 View  12/10/2012   *RADIOLOGY REPORT*  Clinical Data: Chest pain, breast carcinoma  PORTABLE CHEST - 1 VIEW  Comparison: 11/26/2012  Findings: Right IJ port catheter stable.  Mild cardiomegaly as before.  Relatively low lung volumes with resultant crowding of perihilar and bibasilar bronchovascular structures.  No confluent airspace infiltrate or overt edema.  No effusion.  Regional bones unremarkable.  IMPRESSION:  1.  No acute disease   Original Report Authenticated By: D. Andria Rhein, MD   1. LLQ abdominal pain   2. Chest pain at rest     2135 PM Have spoken to medical records at [facility] who are "slammed" and will be unable to send records for "some time".  They are not able to look by CT results currently.  Will repeat CT as it has been 5 days.   2300 PM OSH records have arrived.  Pt had simple diverticulitis.  Also had CP at that time, trop neg x3.  Cardiology consultant did not think CP was ischemic & rec outpt stress  2317 PM Pt feeling improved.  CT w/o evidence of acute process.  Pt wants to eat/drink.    MDM  Pt is a 47 y.o. female with Pmhx as above including somatization d/o, depression, anxiety, breast cancer, CAD with report of 1 stent, who presents with LLQ ab pain for 5 days, diagnosed at [facility] at that time w/ diverticulitis.  She reports continued pain, n/v, and onset of midsternal sharp CP approx 4:45 tonight. Pt has hx of multiple ED visits since 6/'14, concerning narcotic use reported as well.  I attempted to obtain OSH records, but there was significant delay and staff at OSH unable to give a timeframe of when they could be sent.  CT ab/pelvis repeated here and showed no acute disease. EKG  Trop neg x2 and OSH records show recent negative delta trop x3. Doubt ACS, pna, ptx, PE.  Will  d/c home with return precautions for new or worsening symptoms.  Pt can also f/u with PCP for outpt stress testing.     1. LLQ abdominal pain   2. Chest pain at rest        Shanna Cisco, MD 12/10/12 2358

## 2012-12-10 NOTE — ED Notes (Signed)
Patient refusing chest x ray when Radiology tech at bedside to take her. States "its my diverticulitis. I don't need a chest x ray. I need a CT. I want to see the doctor."

## 2012-12-10 NOTE — ED Notes (Signed)
Pt c/o chest pain under left breast that started today, abd pain with n/v x 2 days.  Pt reports movement makes chest pain worse but area is not tender to touch.  Pt reports pain initially radiated to left arm but not at this time.    Denies any SOB.

## 2012-12-11 MED ORDER — HEPARIN SOD (PORK) LOCK FLUSH 100 UNIT/ML IV SOLN
INTRAVENOUS | Status: AC
  Administered 2012-12-11
  Filled 2012-12-11: qty 5

## 2012-12-31 DIAGNOSIS — Z8673 Personal history of transient ischemic attack (TIA), and cerebral infarction without residual deficits: Secondary | ICD-10-CM | POA: Insufficient documentation

## 2012-12-31 DIAGNOSIS — C50919 Malignant neoplasm of unspecified site of unspecified female breast: Secondary | ICD-10-CM | POA: Insufficient documentation

## 2012-12-31 DIAGNOSIS — I10 Essential (primary) hypertension: Secondary | ICD-10-CM | POA: Insufficient documentation

## 2012-12-31 DIAGNOSIS — R52 Pain, unspecified: Secondary | ICD-10-CM | POA: Insufficient documentation

## 2013-01-28 ENCOUNTER — Emergency Department (HOSPITAL_COMMUNITY)
Admission: EM | Admit: 2013-01-28 | Discharge: 2013-01-29 | Disposition: A | Discharge status: Home / Self Care | Payer: Medicaid - Out of State | Attending: Emergency Medicine | Admitting: Emergency Medicine

## 2013-01-28 ENCOUNTER — Emergency Department (HOSPITAL_COMMUNITY): Payer: Medicaid - Out of State

## 2013-01-28 ENCOUNTER — Encounter (HOSPITAL_COMMUNITY): Payer: Self-pay | Admitting: *Deleted

## 2013-01-28 DIAGNOSIS — Z7902 Long term (current) use of antithrombotics/antiplatelets: Secondary | ICD-10-CM | POA: Insufficient documentation

## 2013-01-28 DIAGNOSIS — Z862 Personal history of diseases of the blood and blood-forming organs and certain disorders involving the immune mechanism: Secondary | ICD-10-CM | POA: Insufficient documentation

## 2013-01-28 DIAGNOSIS — Z792 Long term (current) use of antibiotics: Secondary | ICD-10-CM | POA: Insufficient documentation

## 2013-01-28 DIAGNOSIS — Z87891 Personal history of nicotine dependence: Secondary | ICD-10-CM | POA: Insufficient documentation

## 2013-01-28 DIAGNOSIS — I10 Essential (primary) hypertension: Secondary | ICD-10-CM | POA: Insufficient documentation

## 2013-01-28 DIAGNOSIS — Z923 Personal history of irradiation: Secondary | ICD-10-CM | POA: Insufficient documentation

## 2013-01-28 DIAGNOSIS — I2510 Atherosclerotic heart disease of native coronary artery without angina pectoris: Secondary | ICD-10-CM | POA: Insufficient documentation

## 2013-01-28 DIAGNOSIS — Z880 Allergy status to penicillin: Secondary | ICD-10-CM | POA: Insufficient documentation

## 2013-01-28 DIAGNOSIS — R509 Fever, unspecified: Secondary | ICD-10-CM | POA: Insufficient documentation

## 2013-01-28 DIAGNOSIS — Z91040 Latex allergy status: Secondary | ICD-10-CM | POA: Insufficient documentation

## 2013-01-28 DIAGNOSIS — Z853 Personal history of malignant neoplasm of breast: Secondary | ICD-10-CM | POA: Insufficient documentation

## 2013-01-28 DIAGNOSIS — Z8719 Personal history of other diseases of the digestive system: Secondary | ICD-10-CM | POA: Insufficient documentation

## 2013-01-28 DIAGNOSIS — F411 Generalized anxiety disorder: Secondary | ICD-10-CM | POA: Insufficient documentation

## 2013-01-28 DIAGNOSIS — F329 Major depressive disorder, single episode, unspecified: Secondary | ICD-10-CM | POA: Insufficient documentation

## 2013-01-28 DIAGNOSIS — N644 Mastodynia: Secondary | ICD-10-CM | POA: Insufficient documentation

## 2013-01-28 DIAGNOSIS — Z9221 Personal history of antineoplastic chemotherapy: Secondary | ICD-10-CM | POA: Insufficient documentation

## 2013-01-28 DIAGNOSIS — R112 Nausea with vomiting, unspecified: Secondary | ICD-10-CM | POA: Insufficient documentation

## 2013-01-28 DIAGNOSIS — F3289 Other specified depressive episodes: Secondary | ICD-10-CM | POA: Insufficient documentation

## 2013-01-28 DIAGNOSIS — Z79899 Other long term (current) drug therapy: Secondary | ICD-10-CM | POA: Insufficient documentation

## 2013-01-28 DIAGNOSIS — Z9889 Other specified postprocedural states: Secondary | ICD-10-CM | POA: Insufficient documentation

## 2013-01-28 LAB — URINALYSIS, ROUTINE W REFLEX MICROSCOPIC
Bilirubin Urine: NEGATIVE
Glucose, UA: NEGATIVE mg/dL
Nitrite: NEGATIVE
Specific Gravity, Urine: 1.02 (ref 1.005–1.030)
pH: 8 (ref 5.0–8.0)

## 2013-01-28 LAB — URINE MICROSCOPIC-ADD ON

## 2013-01-28 MED ORDER — PROMETHAZINE HCL 25 MG/ML IJ SOLN
12.5000 mg | Freq: Once | INTRAMUSCULAR | Status: AC
  Administered 2013-01-29: 12.5 mg via INTRAVENOUS
  Filled 2013-01-28: qty 1

## 2013-01-28 MED ORDER — SODIUM CHLORIDE 0.9 % IV SOLN
1000.0000 mL | Freq: Once | INTRAVENOUS | Status: AC
  Administered 2013-01-29: 1000 mL via INTRAVENOUS

## 2013-01-28 MED ORDER — SODIUM CHLORIDE 0.9 % IV SOLN: 1000.0000 mL | INTRAVENOUS | Status: DC

## 2013-01-28 MED ORDER — SODIUM CHLORIDE 0.9 % IV SOLN: 1000.0000 mL | Freq: Once | INTRAVENOUS | Status: DC

## 2013-01-28 NOTE — ED Notes (Signed)
Pt presents with c/o post- op fever of 103 oral temp last night per pt. Pt is afebrile at this time. Oral temp rechecked 99.0 degrees F. Pt had bilateral lumpectomy on breast 01/22/2013. Bilateral sites with no s/s of infection, no redness, no drainage and no external drains noted. Dermabond intact at this time. NAD noted. Pt also c/o joint pain with nausea. No emesis noted.

## 2013-01-28 NOTE — ED Notes (Signed)
Pt had bilateral breast surgery and c/o fever, joint aches, and leakage from right breast.

## 2013-01-28 NOTE — ED Notes (Signed)
Pt very short with nurse, and evasive to answering questions regarding surgery , pt on cell phone during initial exam. Pt demanding blanket, repeatedly stating " I need a blanket NOW I'm cold" pt's temp rechecked before blanket given.   Pt wanting to see a dr now and does not want to wait longer. Explained the dr was with another pt and would be in shortly.

## 2013-01-28 NOTE — ED Provider Notes (Signed)
CSN: [id number]     Arrival date & time 01/28/13  1949 History  This chart was scribed for [name], MD by Dorothey Baseman, ED Scribe. This patient was seen in room APA09/APA09 and the patient's care was started at 10:35 PM.    Chief Complaint  Patient presents with  . Fever  . recent breast surgery    The history is provided by the patient. No language interpreter was used.   HPI Comments: [name] is a 47 y.o. female who presents to the Emergency Department complaining of fever onset last night, 104 last night and 101 this morning measured at home, with associated chills, nausea, and emesis (3 episodes last night and 4 episodes this morning). She reports chest pain around the left breast area and tenderness of the right breast. She states that she took Tylenol last night with mild, temporary relief. Patient was diagnosed with breast cancer last year in June that was treated with lumpectomy,chemotherapy and radiation. She reports a history of bilateral breast lumpectomy (performed by Dr. Cherene Altes) on 01/05/2013 after she had a recurrence in both breasts and states that she has an appointment to follow up Oct 8. She had a CT scan, MR, and [place] prior to her lumpectomies.  She reports some drainage from the incision to the right breast today. Patient denies any sick contacts. She denies chest pain, diarrhea, rhinorrhea, sore throat, cough, dysuria, and shortness of breath. Pt reports she called Dr Cherene Altes this morning and he told her to go to the ED, states she had to wait for her room mate to come home from work this evening to come.   PCP Dr Charlynne Cousins in [facility] Cancer Surgeon Dr Cherene Altes in [place]  Past Medical History  Diagnosis Date  . Somatization disorder   . Depression   . Anxiety   . Beta thalassemia trait   . CVA (cerebral infarction)   . Pancreatitis   . Breast cancer   . Hypertension   . Coronary artery disease    Past Surgical History  Procedure Laterality Date  . Tubal  ligation    . Cesarean section    . Partial hysterectomy    . Cardiac catheterization    . Breast lumpectomy     Family History  Problem Relation Age of Onset  . Diabetes Mother   . Diabetes Father   . Hyperlipidemia Mother   . Hyperlipidemia Father   . Hypertension Mother   . Hypertension Father    History  Substance Use Topics  . Smoking status: Former Smoker -- 0.50 packs/day    Types: Cigarettes    Quit date: 10/11/2012  . Smokeless tobacco: Not on file  . Alcohol Use: No   Lives at home Lives with roommate  OB History   Grav Para Term Preterm Abortions TAB SAB Ect Mult Living                 Review of Systems  A complete 10 system review of systems was obtained and all systems are negative except as noted in the HPI and PMH.   Allergies  Latex; Metoclopramide hcl; Penicillins; Lidocaine; Fentanyl; Hydromorphone hcl; Iohexol; Ketorolac tromethamine; Ondansetron hcl; and Prochlorperazine edisylate  Home Medications   Current Outpatient Rx  Name  Route  Sig  Dispense  Refill  . albuterol (PROVENTIL HFA;VENTOLIN HFA) 108 (90 BASE) MCG/ACT inhaler   Inhalation   Inhale 2 puffs into the lungs every 8 (eight) hours as needed for shortness of  breath.         . ALPRAZolam (XANAX) 1 MG tablet   Oral   Take 1 mg by mouth 3 (three) times daily as needed for anxiety.          Marland Kitchen amitriptyline (ELAVIL) 25 MG tablet   Oral   Take 25-50 mg by mouth at bedtime as needed (for mood or imsomnia).         Marland Kitchen amLODipine (NORVASC) 10 MG tablet   Oral   Take 10 mg by mouth 2 (two) times daily.         . ciprofloxacin (CIPRO) 500 MG tablet   Oral   Take 500 mg by mouth daily.         . clopidogrel (PLAVIX) 75 MG tablet   Oral   Take 75 mg by mouth daily.         . Cyanocobalamin (B-12 PO)   Oral   Take 1 tablet by mouth daily.         Marland Kitchen docusate sodium (COLACE) 100 MG capsule   Oral   Take 100 mg by mouth daily.         . folic acid (FOLVITE) 1 MG  tablet   Oral   Take 1 mg by mouth daily.         . furosemide (LASIX) 20 MG tablet   Oral   Take 20 mg by mouth daily.         . hydroxyurea (HYDREA) 500 MG capsule   Oral   Take 500 mg by mouth 2 (two) times daily. May take with food to minimize GI side effects.         Marland Kitchen lisinopril (PRINIVIL,ZESTRIL) 10 MG tablet   Oral   Take 10 mg by mouth every morning.         . mirtazapine (REMERON) 15 MG tablet   Oral   Take 15 mg by mouth at bedtime as needed (for mood, appetite and insomnia).         Marland Kitchen omeprazole (PRILOSEC) 20 MG capsule   Oral   Take 20 mg by mouth 2 (two) times daily.         Marland Kitchen oxyCODONE-acetaminophen (PERCOCET) 5-325 MG per tablet   Oral   Take 1 tablet by mouth every 4 (four) hours as needed for pain.   6 tablet   0   . potassium chloride SA (K-DUR,KLOR-CON) 20 MEQ tablet   Oral   Take 20 mEq by mouth daily.         . promethazine (PHENERGAN) 25 MG tablet   Oral   Take 1 tablet (25 mg total) by mouth every 8 (eight) hours as needed for nausea.   15 tablet   0   . tiZANidine (ZANAFLEX) 4 MG tablet   Oral   Take 4 mg by mouth every 8 (eight) hours as needed (for muscle pains/cramps).         . venlafaxine XR (EFFEXOR-XR) 75 MG 24 hr capsule   Oral   Take 75 mg by mouth daily.         . Vitamin D, Ergocalciferol, (DRISDOL) 50000 UNITS CAPS capsule   Oral   Take 50,000 Units by mouth every Wednesday.         . zolpidem (AMBIEN) 10 MG tablet   Oral   Take 10 mg by mouth at bedtime as needed for sleep.         . nitroGLYCERIN (NITROSTAT) 0.4 MG SL  tablet   Sublingual   Place 0.4 mg under the tongue every 5 (five) minutes as needed for chest pain.          Triage Vitals: BP 127/85  Pulse 104  Temp(Src) 98.9 F (37.2 C) (Oral)  Resp 20  Ht 5\' 4"  (1.626 m)  Wt 220 lb (99.791 kg)  BMI 37.74 kg/m2  SpO2 100%  Vital signs normal except tachycardia   Physical Exam  Nursing note and vitals reviewed. Constitutional:  She is oriented to person, place, and time. She appears well-developed and well-nourished.  Non-toxic appearance. She does not appear ill. No distress.  HENT:  Head: Normocephalic and atraumatic.  Nose: No mucosal edema or rhinorrhea.  Mouth/Throat: Oropharynx is clear and moist and mucous membranes are normal. No dental abscesses or edematous.  Eyes: Conjunctivae and EOM are normal. Pupils are equal, round, and reactive to light.  Neck: Normal range of motion and full passive range of motion without pain. Neck supple.  Pulmonary/Chest: Effort normal and breath sounds normal. No respiratory distress. She has no wheezes. She has no rhonchi. She has no rales. She exhibits no tenderness and no crepitus.  Abdominal: Soft. Normal appearance and bowel sounds are normal. She exhibits no distension. There is no tenderness. There is no rebound and no guarding.  Musculoskeletal: Normal range of motion. She exhibits no edema and no tenderness.  Moves all extremities well.   Neurological: She is alert and oriented to person, place, and time. She has normal strength. No cranial nerve deficit.  Skin: Skin is warm, dry and intact. No rash noted. No erythema. No pallor.  Linear, horizontal surgical incision to the left breast, approximately 2.5 cm, superior and medial to the nipple. Right breast has an area of erythema medial to the nipple. 2 well-healing incisions; 1 involves the lower edge of the inferior areola and the other is medial and inferior to the nipple. There is no induration of her breasts and no active drainage. The wounds are not infected.   Psychiatric: She has a normal mood and affect. Her speech is normal and behavior is normal. Her mood appears not anxious.    ED Course  Procedures (including critical care time)  Medications  0.9 %  sodium chloride infusion (not administered)    Followed by  0.9 %  sodium chloride infusion (not administered)    Followed by  0.9 %  sodium chloride infusion  (not administered)  promethazine (PHENERGAN) injection 12.5 mg (not administered)  oxyCODONE-acetaminophen (PERCOCET/ROXICET) 5-325 MG per tablet 2 tablet (not administered)    DIAGNOSTIC STUDIES: Oxygen Saturation is 100% on room air, normal by my interpretation.    COORDINATION OF CARE: 10:47PM- Will order a chest x-ray, anti-nausea medication, and IV fluids. Discussed treatment plan with patient at bedside and patient verbalized agreement.   01:10 pt turned over to Dr Estell Harpin to get her lab results.   Patient has been to the ED for her stating she has sickle cell disease however she has beta thalassemia trait and has no anemia. She has chronic pain medications. CT scans from [facility] obtained (pt couldn't remember if she had the CT scan done in [place] or [place]). Shows she had a ? 1 mm stone on the left distal ureter in April 2014, however a repeat CT scan done in May was normal.  Review of her prior admissions at [facility], and in her prior laboratory results patient does not have sickle beta thalassemia. She only has been a  thalassemia trait. Patient says she has beta thalassemia trait with crisis. However her prior diagnosis has been somatizations disorder. On review of her narcotic databases in both the [place] and [place] this patient has seen to have prescriptions from multiple physicians in multiple cities.  Review of [facility] site shows she has gotten #20 oxycodone 5 mg tablets from the [place] [facility] [place] [facility] in [place] on 6/25, #30 oxycodone 15 mg tablets [place] physician in [place] on July 3. In the [place] database she also received #60 oxycodone 5 mg tablets on July 8 from a doctor in [place], her PCP. She also received #60 oxycodone 15 mg tablets on June 10 from her PCP in [place] and monthly before that. She also received oxycodone prescriptions from [facility] in [place], a physician in [place], [place], hydrocodone from  [place] and [place] in the past year.  Blood Smear review  Tech Review   Absolute lymphocytosis. Suggest immunophenotyping if a new, persistent finding. MILD ANEMIA WITH MICROCYTOSIS BUT GENERALLY NORMOCHROMIC. fFAVOR IRON DEFICIENCY VS. ANEMIA CHRONIC DISEASE VS. BETA THAL. Reviewed by Coralyn Pear, M.D. 04/28/09   Resulting Agency  [facility]    Specimen Collected: 04/27/09 9:30 PM  Last Resulted: 07/31/10 5:17 PM   Hgb A2 Quant 5.3  (NOTE) Elevated A2 levels can be seen in Beta Thalassemia Trait and a variety of unstable hemoglobin variants. Elevations can also be seen in acquired conditions such as leukemias, megalobloastic anemia and hyperthyroidism. Accurate interpretation of these results should include family history, hemoglobin, hematocrit, and mean corpuscular volume results, red cell morphology, serum iron binding capacity, knowledge of recent transfusions and other pertinent clinical findings.  Reviewed by Dallas Breeding, MD, PhD, FCAP (Electronic Signature on File) (H)  2.2 - 3.2 %  Hgb F Quant 2.0  (NOTE) Patient State HbA2 Level HbF Level ----------------------------------------------------------- Heterozygous B-thalassemia 4-9% 1-5% Homozygous B-thalassemia Normal or increased 80-100%  Heterozygous HPFH Less than 1.5% 10-20% Homozygous HPFH Absent 100% (H)  0.0 - 2.0 %  Hgb S Quant 0.0  0.0 - 0.0 %  Hgb A 92.7 (L)  96.8 - 97.8 %  Hemoglobin Other 0.0  0.0 - 0.0 %  Specimen Collected: 08/26/08 3:00 PM Last Resulted: 08/09/10 11:43 AM     Labs Review  Results for orders placed during the hospital encounter of 01/28/13  URINALYSIS, ROUTINE W REFLEX MICROSCOPIC      Result Value Range   Color, Urine YELLOW  YELLOW   APPearance CLEAR  CLEAR   Specific Gravity, Urine 1.020  1.005 - 1.030   pH 8.0  5.0 - 8.0   Glucose, UA NEGATIVE  NEGATIVE mg/dL   Hgb urine dipstick TRACE (*) NEGATIVE   Bilirubin Urine NEGATIVE  NEGATIVE   Ketones, ur NEGATIVE   NEGATIVE mg/dL   Protein, ur NEGATIVE  NEGATIVE mg/dL   Urobilinogen, UA 2.0 (*) 0.0 - 1.0 mg/dL   Nitrite NEGATIVE  NEGATIVE   Leukocytes, UA NEGATIVE  NEGATIVE  URINE MICROSCOPIC-ADD ON      Result Value Range   Squamous Epithelial / LPF RARE  RARE   Bacteria, UA FEW (*) RARE   Laboratory interpretation all normal     Imaging Review Dg Chest 2 View  01/29/2013   CLINICAL DATA:  Fever and chest pain.  EXAM: CHEST  2 VIEW  COMPARISON:  12/10/2012.  FINDINGS: The Port-A-Cath is stable. The cardiac silhouette, mediastinal and hilar contours are within normal limits and stable. Slightly low lung volumes with  vascular crowding and bibasilar atelectasis. No definite infiltrates or effusions. The bony thorax is intact.  IMPRESSION: Bibasilar atelectasis   Electronically Signed   By: Loralie Champagne M.D.   On: 01/29/2013 00:01    MDM   1. Fever     Disposition pending  Devoria Albe, MD, FACEP   I personally performed the services described in this documentation, which was scribed in my presence. The recorded information has been reviewed and considered.  Devoria Albe, MD, Armando Gang    [name], MD 01/29/13 [phone]

## 2013-01-29 LAB — COMPREHENSIVE METABOLIC PANEL
ALT: 48 U/L — ABNORMAL HIGH (ref 0–35)
AST: 48 U/L — ABNORMAL HIGH (ref 0–37)
Albumin: 4 g/dL (ref 3.5–5.2)
Calcium: 9.8 mg/dL (ref 8.4–10.5)
Sodium: 136 mEq/L (ref 135–145)
Total Protein: 8 g/dL (ref 6.0–8.3)

## 2013-01-29 LAB — CBC WITH DIFFERENTIAL/PLATELET
Eosinophils Absolute: 0.1 10*3/uL (ref 0.0–0.7)
Lymphocytes Relative: 38 % (ref 12–46)
MCH: 22.5 pg — ABNORMAL LOW (ref 26.0–34.0)
MCHC: 32.9 g/dL (ref 30.0–36.0)
Monocytes Absolute: 0.8 10*3/uL (ref 0.1–1.0)
Neutrophils Relative %: 52 % (ref 43–77)
Platelets: 347 10*3/uL (ref 150–400)
RBC: 4.8 MIL/uL (ref 3.87–5.11)

## 2013-01-29 MED ORDER — SULFAMETHOXAZOLE-TRIMETHOPRIM 800-160 MG PO TABS: 1.0000 | ORAL_TABLET | Freq: Two times a day (BID) | ORAL | Status: DC

## 2013-01-29 MED ORDER — HEPARIN SOD (PORK) LOCK FLUSH 100 UNIT/ML IV SOLN
INTRAVENOUS | Status: AC
  Administered 2013-01-29: 500 [IU]
  Filled 2013-01-29: qty 5

## 2013-01-29 MED ORDER — OXYCODONE-ACETAMINOPHEN 5-325 MG PO TABS
2.0000 | ORAL_TABLET | Freq: Once | ORAL | Status: AC
  Administered 2013-01-29: 2 via ORAL
  Filled 2013-01-29: qty 2

## 2014-06-01 DIAGNOSIS — M549 Dorsalgia, unspecified: Secondary | ICD-10-CM | POA: Insufficient documentation

## 2014-07-23 DIAGNOSIS — R079 Chest pain, unspecified: Secondary | ICD-10-CM | POA: Insufficient documentation

## 2014-08-17 ENCOUNTER — Encounter (HOSPITAL_COMMUNITY): Payer: Self-pay

## 2014-08-17 ENCOUNTER — Emergency Department (HOSPITAL_COMMUNITY): Payer: Medicare Other

## 2014-08-17 ENCOUNTER — Observation Stay (HOSPITAL_COMMUNITY)
Admission: EM | Admit: 2014-08-17 | Discharge: 2014-08-19 | Disposition: A | Discharge status: Home / Self Care | Payer: Medicare Other | Attending: Internal Medicine | Admitting: Internal Medicine

## 2014-08-17 DIAGNOSIS — G8194 Hemiplegia, unspecified affecting left nondominant side: Secondary | ICD-10-CM | POA: Diagnosis not present

## 2014-08-17 DIAGNOSIS — Z91040 Latex allergy status: Secondary | ICD-10-CM | POA: Diagnosis not present

## 2014-08-17 DIAGNOSIS — R519 Headache, unspecified: Secondary | ICD-10-CM

## 2014-08-17 DIAGNOSIS — Z8673 Personal history of transient ischemic attack (TIA), and cerebral infarction without residual deficits: Secondary | ICD-10-CM | POA: Diagnosis not present

## 2014-08-17 DIAGNOSIS — D563 Thalassemia minor: Secondary | ICD-10-CM | POA: Insufficient documentation

## 2014-08-17 DIAGNOSIS — Z9851 Tubal ligation status: Secondary | ICD-10-CM | POA: Insufficient documentation

## 2014-08-17 DIAGNOSIS — F450 Somatization disorder: Secondary | ICD-10-CM | POA: Diagnosis not present

## 2014-08-17 DIAGNOSIS — Z880 Allergy status to penicillin: Secondary | ICD-10-CM | POA: Insufficient documentation

## 2014-08-17 DIAGNOSIS — F319 Bipolar disorder, unspecified: Secondary | ICD-10-CM | POA: Diagnosis not present

## 2014-08-17 DIAGNOSIS — R51 Headache: Secondary | ICD-10-CM

## 2014-08-17 DIAGNOSIS — G894 Chronic pain syndrome: Secondary | ICD-10-CM | POA: Diagnosis not present

## 2014-08-17 DIAGNOSIS — K3184 Gastroparesis: Secondary | ICD-10-CM | POA: Diagnosis not present

## 2014-08-17 DIAGNOSIS — I2510 Atherosclerotic heart disease of native coronary artery without angina pectoris: Secondary | ICD-10-CM | POA: Diagnosis not present

## 2014-08-17 DIAGNOSIS — Z888 Allergy status to other drugs, medicaments and biological substances status: Secondary | ICD-10-CM | POA: Diagnosis not present

## 2014-08-17 DIAGNOSIS — Z7902 Long term (current) use of antithrombotics/antiplatelets: Secondary | ICD-10-CM | POA: Diagnosis not present

## 2014-08-17 DIAGNOSIS — I10 Essential (primary) hypertension: Secondary | ICD-10-CM | POA: Insufficient documentation

## 2014-08-17 DIAGNOSIS — Z87891 Personal history of nicotine dependence: Secondary | ICD-10-CM | POA: Diagnosis not present

## 2014-08-17 DIAGNOSIS — R531 Weakness: Secondary | ICD-10-CM

## 2014-08-17 DIAGNOSIS — F329 Major depressive disorder, single episode, unspecified: Secondary | ICD-10-CM | POA: Diagnosis present

## 2014-08-17 DIAGNOSIS — F419 Anxiety disorder, unspecified: Secondary | ICD-10-CM | POA: Diagnosis not present

## 2014-08-17 DIAGNOSIS — Z853 Personal history of malignant neoplasm of breast: Secondary | ICD-10-CM | POA: Insufficient documentation

## 2014-08-17 DIAGNOSIS — R2990 Unspecified symptoms and signs involving the nervous system: Secondary | ICD-10-CM

## 2014-08-17 DIAGNOSIS — Z90710 Acquired absence of both cervix and uterus: Secondary | ICD-10-CM | POA: Diagnosis not present

## 2014-08-17 DIAGNOSIS — Z884 Allergy status to anesthetic agent status: Secondary | ICD-10-CM | POA: Insufficient documentation

## 2014-08-17 DIAGNOSIS — F32A Depression, unspecified: Secondary | ICD-10-CM | POA: Diagnosis present

## 2014-08-17 DIAGNOSIS — Z86010 Personal history of colonic polyps: Secondary | ICD-10-CM | POA: Diagnosis not present

## 2014-08-17 DIAGNOSIS — F449 Dissociative and conversion disorder, unspecified: Secondary | ICD-10-CM

## 2014-08-17 LAB — DIFFERENTIAL
BASOS PCT: 0 % (ref 0–1)
Basophils Absolute: 0 10*3/uL (ref 0.0–0.1)
Eosinophils Absolute: 0.1 10*3/uL (ref 0.0–0.7)
Eosinophils Relative: 1 % (ref 0–5)
Lymphocytes Relative: 45 % (ref 12–46)
Lymphs Abs: 3.7 10*3/uL (ref 0.7–4.0)
Monocytes Absolute: 0.6 10*3/uL (ref 0.1–1.0)
Monocytes Relative: 7 % (ref 3–12)
NEUTROS PCT: 47 % (ref 43–77)
Neutro Abs: 3.8 10*3/uL (ref 1.7–7.7)

## 2014-08-17 LAB — COMPREHENSIVE METABOLIC PANEL
ALK PHOS: 56 U/L (ref 39–117)
ALT: 18 U/L (ref 0–35)
ANION GAP: 11 (ref 5–15)
AST: 17 U/L (ref 0–37)
Albumin: 4.1 g/dL (ref 3.5–5.2)
BILIRUBIN TOTAL: 0.8 mg/dL (ref 0.3–1.2)
BUN: 7 mg/dL (ref 6–23)
CALCIUM: 9.4 mg/dL (ref 8.4–10.5)
CO2: 22 mmol/L (ref 19–32)
Chloride: 106 mmol/L (ref 96–112)
Creatinine, Ser: 0.72 mg/dL (ref 0.50–1.10)
GFR calc Af Amer: >90 mL/min (ref >90)
Glucose, Bld: 90 mg/dL (ref 70–99)
Potassium: 3.8 mmol/L (ref 3.5–5.1)
SODIUM: 139 mmol/L (ref 135–145)
Total Protein: 7.6 g/dL (ref 6.0–8.3)

## 2014-08-17 LAB — I-STAT CHEM 8, ED
BUN: 8 mg/dL (ref 6–23)
Calcium, Ion: 1.17 mmol/L (ref 1.12–1.23)
Chloride: 106 mmol/L (ref 96–112)
Creatinine, Ser: 0.6 mg/dL (ref 0.50–1.10)
Glucose, Bld: 91 mg/dL (ref 70–99)
HCT: 38 % (ref 36.0–46.0)
HEMOGLOBIN: 12.9 g/dL (ref 12.0–15.0)
Potassium: 3.8 mmol/L (ref 3.5–5.1)
Sodium: 141 mmol/L (ref 135–145)
TCO2: 20 mmol/L (ref 0–100)

## 2014-08-17 LAB — APTT: aPTT: 49 seconds — ABNORMAL HIGH (ref 24–37)

## 2014-08-17 LAB — CBC
HCT: 36.7 % (ref 36.0–46.0)
Hemoglobin: 12.1 g/dL (ref 12.0–15.0)
MCH: 23.3 pg — AB (ref 26.0–34.0)
MCHC: 33 g/dL (ref 30.0–36.0)
MCV: 70.6 fL — AB (ref 78.0–100.0)
PLATELETS: 322 10*3/uL (ref 150–400)
RBC: 5.2 MIL/uL — ABNORMAL HIGH (ref 3.87–5.11)
RDW: 15.5 % (ref 11.5–15.5)
WBC: 8.1 10*3/uL (ref 4.0–10.5)

## 2014-08-17 LAB — PROTIME-INR
INR: 1.06 (ref 0.00–1.49)
Prothrombin Time: 13.9 seconds (ref 11.6–15.2)

## 2014-08-17 LAB — I-STAT TROPONIN, ED: TROPONIN I, POC: 0 ng/mL (ref 0.00–0.08)

## 2014-08-17 MED ORDER — SODIUM CHLORIDE 0.9 % IV SOLN
1000.0000 mL | INTRAVENOUS | Status: DC
  Administered 2014-08-17: 1000 mL via INTRAVENOUS

## 2014-08-17 MED ORDER — DIPHENHYDRAMINE HCL 50 MG/ML IJ SOLN
25.0000 mg | Freq: Once | INTRAMUSCULAR | Status: AC
  Administered 2014-08-17: 25 mg via INTRAVENOUS
  Filled 2014-08-17: qty 1

## 2014-08-17 MED ORDER — PROMETHAZINE HCL 25 MG/ML IJ SOLN
25.0000 mg | Freq: Once | INTRAMUSCULAR | Status: AC
  Administered 2014-08-17: 25 mg via INTRAVENOUS
  Filled 2014-08-17: qty 1

## 2014-08-17 MED ORDER — SODIUM CHLORIDE 0.9 % IV SOLN
1000.0000 mL | Freq: Once | INTRAVENOUS | Status: AC
  Administered 2014-08-17: 1000 mL via INTRAVENOUS

## 2014-08-17 MED ORDER — ACETAMINOPHEN 325 MG PO TABS
650.0000 mg | ORAL_TABLET | Freq: Once | ORAL | Status: AC
  Administered 2014-08-17: 650 mg via ORAL
  Filled 2014-08-17: qty 2

## 2014-08-17 NOTE — Consult Note (Signed)
Stroke Consult    Chief Complaint: left sided weakness and slurred speech  HPI: [name] is an 49 y.o. female hx of HTN, reported prior CVA, somatization disorder presenting with acute onset of left sided weakness and slurred speech. LSW at 1600, shortly after developed acute onset of left sided weakness involving arm and leg. Upon arrival to ED notes she developed slurred speech and difficulty talking. BP 116/96 in the ED.   She has had prior history of left sided weakness in the past. Received IV tPA in 09/2012 for similar presentation. Unable to receive MRI at that time due to claustrophobia.  CT head imaging reviewed shows no acute process, no signs of chronic infarct. MRI brain DWI imaging reviewed, no signs of acute stroke.   Date last known well: 08/17/2014 Time last known well: 1600 tPA Given: no, suspect symptoms not stroke related. DWI imaging negative  Past Medical History  Diagnosis Date  . Somatization disorder   . Depression   . Anxiety   . Beta thalassemia trait   . CVA (cerebral infarction)   . Pancreatitis   . Breast cancer   . Hypertension   . Coronary artery disease     Past Surgical History  Procedure Laterality Date  . Tubal ligation    . Cesarean section    . Partial hysterectomy    . Cardiac catheterization    . Breast lumpectomy      Family History  Problem Relation Age of Onset  . Diabetes Mother   . Diabetes Father   . Hyperlipidemia Mother   . Hyperlipidemia Father   . Hypertension Mother   . Hypertension Father    Social History:  reports that she quit smoking about 22 months ago. Her smoking use included Cigarettes. She smoked 0.50 packs per day. She does not have any smokeless tobacco history on file. She reports that she uses illicit drugs. She reports that she does not drink alcohol.  Allergies:  Allergies  Allergen Reactions  . Latex Shortness Of Breath, Itching, Swelling and Rash    Also Tongue swelling   . Metoclopramide  Hcl Shortness Of Breath, Itching, Swelling and Rash    Also tongue swelling   . Penicillins Shortness Of Breath, Itching, Swelling and Rash    Also tongue swelling   . Lidocaine   . Fentanyl Itching and Rash  . Hydromorphone Hcl Itching and Rash  . Iohexol Itching and Rash  . Ketorolac Tromethamine Itching and Rash  . Ondansetron Hcl Itching and Rash  . Prochlorperazine Edisylate Itching and Rash     (Not in a hospital admission)  ROS: Out of a complete 14 system review, the patient complains of only the following symptoms, and all other reviewed systems are negative.    Physical Examination: Filed Vitals:   08/17/14 1655  BP: 116/96  Pulse: 98  Temp: 99.5 F (37.5 C)  Resp: 20   Physical Exam  Constitutional: He appears well-developed and well-nourished.  Psych: Affect appropriate to situation Eyes: No scleral injection HENT: No OP obstrucion Head: Normocephalic.  Cardiovascular: Normal rate and regular rhythm.  Respiratory: Effort normal and breath sounds normal.  GI: Soft. Bowel sounds are normal. No distension. There is no tenderness.  Skin: WDI   Neurologic Examination: Mental Status: Alert, oriented, thought content appropriate. Stuttering type speech pattern though not true Brocas aphasia. Mild dysarthria.  Able to follow 3 step commands without difficulty. Cranial Nerves: II: funduscopic exam wnl bilaterally, visual fields grossly normal, pupils  equal, round, reactive to light and accommodation III,IV, VI: ptosis not present, extra-ocular motions intact bilaterally V,VII: smile symmetric, facial light touch sensation normal bilaterally VIII: hearing normal bilaterally IX,X: gag reflex present XI: trapezius strength/neck flexion strength normal bilaterally XII: tongue strength normal  Motor: RUE and RLE 5/5 strength LUE flaccid weakness though question effort as able to guard face when arm dropped LLE flaccid weakness though question effort with +  Hoover sign Tone and bulk:normal tone throughout; no atrophy noted Sensory: diminished LT and PP on left side Deep Tendon Reflexes: 2+ and symmetric throughout Plantars: Right: downgoing   Left: downgoing Cerebellar: normal FTN and HTS on the right side, unable to test on the left Gait: deferred  Laboratory Studies:   Basic Metabolic Panel: No results for input(s): NA, K, CL, CO2, GLUCOSE, BUN, CREATININE, CALCIUM, MG, PHOS in the last 168 hours.  Liver Function Tests: No results for input(s): AST, ALT, ALKPHOS, BILITOT, PROT, ALBUMIN in the last 168 hours. No results for input(s): LIPASE, AMYLASE in the last 168 hours. No results for input(s): AMMONIA in the last 168 hours.  CBC: No results for input(s): WBC, NEUTROABS, HGB, HCT, MCV, PLT in the last 168 hours.  Cardiac Enzymes: No results for input(s): CKTOTAL, CKMB, CKMBINDEX, TROPONINI in the last 168 hours.  BNP: Invalid input(s): POCBNP  CBG: No results for input(s): GLUCAP in the last 168 hours.  Microbiology: Results for orders placed or performed during the hospital encounter of 10/15/12  MRSA PCR Screening     Status: None   Collection Time: 10/15/12 12:20 PM  Result Value Ref Range Status   MRSA by PCR NEGATIVE NEGATIVE Final    Comment:        The GeneXpert MRSA Assay (FDA approved for NASAL specimens only), is one component of a comprehensive MRSA colonization surveillance program. It is not intended to diagnose MRSA infection nor to guide or monitor treatment for MRSA infections.    Coagulation Studies: No results for input(s): LABPROT, INR in the last 72 hours.  Urinalysis: No results for input(s): COLORURINE, LABSPEC, PHURINE, GLUCOSEU, HGBUR, BILIRUBINUR, KETONESUR, PROTEINUR, UROBILINOGEN, NITRITE, LEUKOCYTESUR in the last 168 hours.  Invalid input(s): APPERANCEUR  Lipid Panel:     Component Value Date/Time   CHOL 128 10/17/2012 0515   TRIG 99 10/17/2012 0515   HDL 46 10/17/2012 0515    CHOLHDL 2.8 10/17/2012 0515   VLDL 20 10/17/2012 0515   LDLCALC 62 10/17/2012 0515    HgbA1C:  Lab Results  Component Value Date   HGBA1C 5.7* 10/16/2012    Urine Drug Screen:     Component Value Date/Time   LABOPIA POSITIVE* 10/17/2012 0611   COCAINSCRNUR NONE DETECTED 10/17/2012 0611   LABBENZ POSITIVE* 10/17/2012 0611   AMPHETMU NONE DETECTED 10/17/2012 0611   THCU NONE DETECTED 10/17/2012 0611   LABBARB NONE DETECTED 10/17/2012 0611    Alcohol Level: No results for input(s): ETH in the last 168 hours.   Imaging: Ct Head Wo Contrast  08/17/2014   CLINICAL DATA:  Left facial droop and slurred speech.  EXAM: CT HEAD WITHOUT CONTRAST  TECHNIQUE: Contiguous axial images were obtained from the base of the skull through the vertex without intravenous contrast.  COMPARISON:  Head CT scan 10/15/2012.  FINDINGS: The brain appears normal without hemorrhage, infarct, mass lesion, mass effect, midline shift or abnormal extra-axial fluid collection. No hydrocephalus or pneumocephalus. The calvarium is intact imaged paranasal sinuses and mastoid air cells are clear.  IMPRESSION: Negative head CT.  Findings called to Dr. Regenia Skeeter at the time of interpretation.   Electronically Signed   By: Inge Rise M.D.   On: 08/17/2014 17:16    Assessment: 49 y.o. female hx of HTN, prior reported CVA and somatization disorder presenting with acute onset of left sided weakness and speech difficulties. Physical exam findings are inconsistent. DWI imaging shows no acute stroke. Suspect conversion disorder. Patient does endorse a headache, cannot rule out complex migraine though atypical headache presentation.   -no further stroke workup indicated as DWI negative for acute stroke -can try migraine cocktail for symptomatic relief of headache -may benefit from psychiatry follow up   Jim Like, DO [facility] [phone]  If 7pm- 7am, please page neurology on call as listed in  [place]. 08/17/2014, 5:28 PM

## 2014-08-17 NOTE — ED Notes (Signed)
Pt. Transported to MRI with RN and Neurologist.

## 2014-08-17 NOTE — ED Notes (Signed)
Activated CODE STROKE

## 2014-08-17 NOTE — ED Provider Notes (Signed)
4:58 PM asked by nursing staff to come evaluate patient in triage. Patient endorses left-sided weakness since around 4 PM today (one hour ago). Prior history of strokes but resolved her symptoms. Numbness and heaviness. Right side normal neurologic exam. No facial droop or obvious cranial nerve deficits. Unable to lift left arm or leg off of the stretcher. Will call a code stroke and sent to the acute care side.  Sherwood Gambler, MD 08/17/14 [phone]

## 2014-08-17 NOTE — ED Notes (Signed)
Called to verify that Code Stroke was already called in @ 1701

## 2014-08-17 NOTE — ED Provider Notes (Signed)
CSN: [id number]     Arrival date & time 08/17/14  1621 History   First MD Initiated Contact with Patient 08/17/14 1711     Chief Complaint  Patient presents with  . Numbness  . Extremity Weakness     (Consider location/radiation/quality/duration/timing/severity/associated sxs/prior Treatment) Patient is a 49 y.o. female presenting with extremity weakness. The history is provided by the patient.  Extremity Weakness  She says that she started having left-sided weakness and difficulty talking shortly before arriving in the ED. She does endorse a right-sided headache. She denies chest pain, heaviness, tightness, pressure. There is no nausea or vomiting. She was seen in triage and coach stroke was activated.  Past Medical History  Diagnosis Date  . Somatization disorder   . Depression   . Anxiety   . Beta thalassemia trait   . CVA (cerebral infarction)   . Pancreatitis   . Breast cancer   . Hypertension   . Coronary artery disease    Past Surgical History  Procedure Laterality Date  . Tubal ligation    . Cesarean section    . Partial hysterectomy    . Cardiac catheterization    . Breast lumpectomy     Family History  Problem Relation Age of Onset  . Diabetes Mother   . Diabetes Father   . Hyperlipidemia Mother   . Hyperlipidemia Father   . Hypertension Mother   . Hypertension Father    History  Substance Use Topics  . Smoking status: Former Smoker -- 0.50 packs/day    Types: Cigarettes    Quit date: 10/11/2012  . Smokeless tobacco: Not on file  . Alcohol Use: No   OB History    No data available     Review of Systems  Musculoskeletal: Positive for extremity weakness.  All other systems reviewed and are negative.     Allergies  Latex; Metoclopramide hcl; Penicillins; Lidocaine; Fentanyl; Hydromorphone hcl; Iohexol; Ketorolac tromethamine; Ondansetron hcl; and Prochlorperazine edisylate  Home Medications   Prior to Admission medications   Medication Sig  Start Date End Date Taking? Authorizing Provider  albuterol (PROVENTIL HFA;VENTOLIN HFA) 108 (90 BASE) MCG/ACT inhaler Inhale 2 puffs into the lungs every 8 (eight) hours as needed for shortness of breath.    Historical Provider, MD  ALPRAZolam Duanne Moron) 1 MG tablet Take 1 mg by mouth 3 (three) times daily as needed for anxiety.     Historical Provider, MD  amitriptyline (ELAVIL) 25 MG tablet Take 25-50 mg by mouth at bedtime as needed (for mood or imsomnia).    Historical Provider, MD  amLODipine (NORVASC) 10 MG tablet Take 10 mg by mouth 2 (two) times daily.    Historical Provider, MD  ciprofloxacin (CIPRO) 500 MG tablet Take 500 mg by mouth daily.    Historical Provider, MD  clopidogrel (PLAVIX) 75 MG tablet Take 75 mg by mouth daily.    Historical Provider, MD  Cyanocobalamin (B-12 PO) Take 1 tablet by mouth daily.    Historical Provider, MD  docusate sodium (COLACE) 100 MG capsule Take 100 mg by mouth daily.    Historical Provider, MD  folic acid (FOLVITE) 1 MG tablet Take 1 mg by mouth daily.    Historical Provider, MD  furosemide (LASIX) 20 MG tablet Take 20 mg by mouth daily.    Historical Provider, MD  hydroxyurea (HYDREA) 500 MG capsule Take 500 mg by mouth 2 (two) times daily. May take with food to minimize GI side effects.    Historical Provider,  MD  lisinopril (PRINIVIL,ZESTRIL) 10 MG tablet Take 10 mg by mouth every morning.    Historical Provider, MD  mirtazapine (REMERON) 15 MG tablet Take 15 mg by mouth at bedtime as needed (for mood, appetite and insomnia).    Historical Provider, MD  nitroGLYCERIN (NITROSTAT) 0.4 MG SL tablet Place 0.4 mg under the tongue every 5 (five) minutes as needed for chest pain.    Historical Provider, MD  omeprazole (PRILOSEC) 20 MG capsule Take 20 mg by mouth 2 (two) times daily.    Historical Provider, MD  oxyCODONE-acetaminophen (PERCOCET) 5-325 MG per tablet Take 1 tablet by mouth every 4 (four) hours as needed for pain. 12/10/12   Ernestina Patches, MD   potassium chloride SA (K-DUR,KLOR-CON) 20 MEQ tablet Take 20 mEq by mouth daily.    Historical Provider, MD  promethazine (PHENERGAN) 25 MG tablet Take 1 tablet (25 mg total) by mouth every 8 (eight) hours as needed for nausea. 11/19/12   Rolland Porter, MD  sulfamethoxazole-trimethoprim (SEPTRA DS) 800-160 MG per tablet Take 1 tablet by mouth 2 (two) times daily. 01/29/13   Milton Ferguson, MD  tiZANidine (ZANAFLEX) 4 MG tablet Take 4 mg by mouth every 8 (eight) hours as needed (for muscle pains/cramps).    Historical Provider, MD  venlafaxine XR (EFFEXOR-XR) 75 MG 24 hr capsule Take 75 mg by mouth daily.    Historical Provider, MD  Vitamin D, Ergocalciferol, (DRISDOL) 50000 UNITS CAPS capsule Take 50,000 Units by mouth every Wednesday.    Historical Provider, MD  zolpidem (AMBIEN) 10 MG tablet Take 10 mg by mouth at bedtime as needed for sleep.    Historical Provider, MD   BP 116/96 mmHg  Pulse 98  Temp(Src) 99.5 F (37.5 C) (Oral)  Resp 20  SpO2 97% Physical Exam  Nursing note and vitals reviewed.  49 year old female, resting comfortably and in no acute distress. Vital signs are significant for diastolic hypertension. Oxygen saturation is 97%, which is normal. Head is normocephalic and atraumatic. PERRLA, EOMI. Oropharynx is clear. Neck is nontender and supple without adenopathy or JVD. There are no carotid bruits. Back is nontender and there is no CVA tenderness. Lungs are clear without rales, wheezes, or rhonchi. Chest is nontender. Heart has regular rate and rhythm without murmur. Abdomen is soft, flat, nontender without masses or hepatosplenomegaly and peristalsis is normoactive. Extremities have no cyanosis or edema, full range of motion is present. Skin is warm and dry without rash. Neurologic: She is awake and alert. Speech is somewhat slow and slightly dysarthric. There is a questionable left central facial droop. There is a rather dense left hemiplegia but no Babinski response.  ED  Course  Procedures (including critical care time) Labs Review Results for orders placed or performed during the hospital encounter of 08/17/14  Protime-INR  Result Value Ref Range   Prothrombin Time 13.9 11.6 - 15.2 seconds   INR 1.06 0.00 - 1.49  APTT  Result Value Ref Range   aPTT 49 (H) 24 - 37 seconds  CBC  Result Value Ref Range   WBC 8.1 4.0 - 10.5 K/uL   RBC 5.20 (H) 3.87 - 5.11 MIL/uL   Hemoglobin 12.1 12.0 - 15.0 g/dL   HCT 36.7 36.0 - 46.0 %   MCV 70.6 (L) 78.0 - 100.0 fL   MCH 23.3 (L) 26.0 - 34.0 pg   MCHC 33.0 30.0 - 36.0 g/dL   RDW 15.5 11.5 - 15.5 %   Platelets 322 150 - 400  K/uL  Differential  Result Value Ref Range   Neutrophils Relative % 47 43 - 77 %   Neutro Abs 3.8 1.7 - 7.7 K/uL   Lymphocytes Relative 45 12 - 46 %   Lymphs Abs 3.7 0.7 - 4.0 K/uL   Monocytes Relative 7 3 - 12 %   Monocytes Absolute 0.6 0.1 - 1.0 K/uL   Eosinophils Relative 1 0 - 5 %   Eosinophils Absolute 0.1 0.0 - 0.7 K/uL   Basophils Relative 0 0 - 1 %   Basophils Absolute 0.0 0.0 - 0.1 K/uL  Comprehensive metabolic panel  Result Value Ref Range   Sodium 139 135 - 145 mmol/L   Potassium 3.8 3.5 - 5.1 mmol/L   Chloride 106 96 - 112 mmol/L   CO2 22 19 - 32 mmol/L   Glucose, Bld 90 70 - 99 mg/dL   BUN 7 6 - 23 mg/dL   Creatinine, Ser 0.72 0.50 - 1.10 mg/dL   Calcium 9.4 8.4 - 10.5 mg/dL   Total Protein 7.6 6.0 - 8.3 g/dL   Albumin 4.1 3.5 - 5.2 g/dL   AST 17 0 - 37 U/L   ALT 18 0 - 35 U/L   Alkaline Phosphatase 56 39 - 117 U/L   Total Bilirubin 0.8 0.3 - 1.2 mg/dL   GFR calc non Af Amer >90 >90 mL/min   GFR calc Af Amer >90 >90 mL/min   Anion gap 11 5 - 15  I-Stat Chem 8, ED  Result Value Ref Range   Sodium 141 135 - 145 mmol/L   Potassium 3.8 3.5 - 5.1 mmol/L   Chloride 106 96 - 112 mmol/L   BUN 8 6 - 23 mg/dL   Creatinine, Ser 0.60 0.50 - 1.10 mg/dL   Glucose, Bld 91 70 - 99 mg/dL   Calcium, Ion 1.17 1.12 - 1.23 mmol/L   TCO2 20 0 - 100 mmol/L   Hemoglobin 12.9 12.0  - 15.0 g/dL   HCT 38.0 36.0 - 46.0 %  I-Stat Troponin, ED (not at [facility])  Result Value Ref Range   Troponin i, poc 0.00 0.00 - 0.08 ng/mL   Comment 3            Imaging Review Ct Head Wo Contrast  08/17/2014   CLINICAL DATA:  Left facial droop and slurred speech.  EXAM: CT HEAD WITHOUT CONTRAST  TECHNIQUE: Contiguous axial images were obtained from the base of the skull through the vertex without intravenous contrast.  COMPARISON:  Head CT scan 10/15/2012.  FINDINGS: The brain appears normal without hemorrhage, infarct, mass lesion, mass effect, midline shift or abnormal extra-axial fluid collection. No hydrocephalus or pneumocephalus. The calvarium is intact imaged paranasal sinuses and mastoid air cells are clear.  IMPRESSION: Negative head CT. Findings called to Dr. Regenia Skeeter at the time of interpretation.   Electronically Signed   By: Inge Rise M.D.   On: 08/17/2014 17:16   Mr Brain Wo Contrast  08/17/2014   CLINICAL DATA:  Code stroke. Suspected conversion reaction. Possible facial droop and slurred speech.  EXAM: MRI HEAD WITHOUT CONTRAST  TECHNIQUE: Multiplanar, multiecho pulse sequences of the brain and surrounding structures were obtained without intravenous contrast.  COMPARISON:  CT head earlier in the day.  FINDINGS: Only the tibiae sequence was obtained.  This was normal.  IMPRESSION: Negative exam.  No evidence for acute stroke.  Critical Value/emergent results were discussed in person at the time of interpretation on 08/17/2014 at 6:03 pm to Dr.  Jim Like , who verbally acknowledged these results.   Electronically Signed   By: Rolla Flatten M.D.   On: 08/17/2014 18:03   Images viewed by me.   EKG Interpretation   Date/Time:  Tuesday August 17 2014 17:16:47 EDT Ventricular Rate:  93 PR Interval:  129 QRS Duration: 82 QT Interval:  356 QTC Calculation: 443 R Axis:   36 Text Interpretation:  Sinus rhythm Baseline wander in lead(s) II III aVR  aVF V1 V2 Otherwise within  normal limits When compared with ECG of 8/13/,  No significant change was found Confirmed by [facility]  MD, Celestine Prim ([id number]) on  08/17/2014 5:24:52 PM      CRITICAL CARE Performed by: TRVUY,EBXID Total critical care time: 45 minutes Critical care time was exclusive of separately billable procedures and treating other patients. Critical care was necessary to treat or prevent imminent or life-threatening deterioration. Critical care was time spent personally by me on the following activities: development of treatment plan with patient and/or surrogate as well as nursing, discussions with consultants, evaluation of patient's response to treatment, examination of patient, obtaining history from patient or surrogate, ordering and performing treatments and interventions, ordering and review of laboratory studies, ordering and review of radiographic studies, pulse oximetry and re-evaluation of patient's condition.  MDM   Final diagnoses:  Stroke-like symptoms  Conversion reaction  Headache, unspecified headache type    Stroke involving the right hemisphere. Dr. Janann Colonel of neurology services here evaluating the patient. There is some question about possible conversion reaction, so she will be sent for diffusion-weighted MRI to look for signs of acute stroke.  MRI has come back negative for acute stroke. She will be given a headache cocktail with IV fluids, promethazine, and diphenhydramine.  She feels somewhat better after noted treatment but is still unable to use her left arm. She will need to be admitted for observation. Case is discussed with Dr. Shanon Brow of [facility] who agrees to see the patient in the ED.  Delora Fuel, MD 56/86/16 [id number]

## 2014-08-17 NOTE — ED Notes (Signed)
Asked pt if she could produce urine. Pt stated that she did not want to be bothered.

## 2014-08-17 NOTE — ED Notes (Signed)
Pt states that she began having left sided weakness and numbness at 4pm. Pt states that she has hx of stroke in 2007. Pt had no movement in left arm and leg. Dr Regenia Skeeter cleared pt in triage.

## 2014-08-18 DIAGNOSIS — F450 Somatization disorder: Secondary | ICD-10-CM

## 2014-08-18 DIAGNOSIS — I10 Essential (primary) hypertension: Secondary | ICD-10-CM

## 2014-08-18 DIAGNOSIS — F449 Dissociative and conversion disorder, unspecified: Secondary | ICD-10-CM | POA: Diagnosis not present

## 2014-08-18 DIAGNOSIS — G8190 Hemiplegia, unspecified affecting unspecified side: Secondary | ICD-10-CM | POA: Diagnosis not present

## 2014-08-18 DIAGNOSIS — F419 Anxiety disorder, unspecified: Secondary | ICD-10-CM | POA: Diagnosis not present

## 2014-08-18 DIAGNOSIS — F329 Major depressive disorder, single episode, unspecified: Secondary | ICD-10-CM

## 2014-08-18 DIAGNOSIS — F3130 Bipolar disorder, current episode depressed, mild or moderate severity, unspecified: Secondary | ICD-10-CM | POA: Diagnosis not present

## 2014-08-18 DIAGNOSIS — R2990 Unspecified symptoms and signs involving the nervous system: Secondary | ICD-10-CM | POA: Insufficient documentation

## 2014-08-18 MED ORDER — VENLAFAXINE HCL ER 75 MG PO CP24
75.0000 mg | ORAL_CAPSULE | Freq: Every day | ORAL | Status: DC
  Administered 2014-08-18: 75 mg via ORAL
  Filled 2014-08-18: qty 1

## 2014-08-18 MED ORDER — SODIUM CHLORIDE 0.9 % IV SOLN: 250.0000 mL | INTRAVENOUS | Status: DC | PRN

## 2014-08-18 MED ORDER — ENOXAPARIN SODIUM 40 MG/0.4ML ~~LOC~~ SOLN
40.0000 mg | SUBCUTANEOUS | Status: DC
  Administered 2014-08-18: 40 mg via SUBCUTANEOUS
  Filled 2014-08-18: qty 0.4

## 2014-08-18 MED ORDER — DIPHENHYDRAMINE HCL 25 MG PO CAPS
25.0000 mg | ORAL_CAPSULE | Freq: Four times a day (QID) | ORAL | Status: DC | PRN
  Administered 2014-08-18 – 2014-08-19 (×2): 25 mg via ORAL
  Filled 2014-08-18 (×2): qty 1

## 2014-08-18 MED ORDER — SODIUM CHLORIDE 0.9 % IJ SOLN: 3.0000 mL | INTRAMUSCULAR | Status: DC | PRN

## 2014-08-18 MED ORDER — HYDROXYUREA 500 MG PO CAPS
500.0000 mg | ORAL_CAPSULE | Freq: Two times a day (BID) | ORAL | Status: DC
  Administered 2014-08-18 – 2014-08-19 (×3): 500 mg via ORAL
  Filled 2014-08-18 (×3): qty 1

## 2014-08-18 MED ORDER — SODIUM CHLORIDE 0.9 % IJ SOLN: 3.0000 mL | Freq: Two times a day (BID) | INTRAMUSCULAR | Status: DC

## 2014-08-18 MED ORDER — LISINOPRIL 10 MG PO TABS
10.0000 mg | ORAL_TABLET | Freq: Every morning | ORAL | Status: DC
  Administered 2014-08-18: 10 mg via ORAL
  Filled 2014-08-18: qty 1

## 2014-08-18 MED ORDER — SODIUM CHLORIDE 0.9 % IJ SOLN
3.0000 mL | Freq: Two times a day (BID) | INTRAMUSCULAR | Status: DC
  Administered 2014-08-18: 3 mL via INTRAVENOUS

## 2014-08-18 MED ORDER — ALPRAZOLAM 0.5 MG PO TABS
1.0000 mg | ORAL_TABLET | Freq: Three times a day (TID) | ORAL | Status: DC | PRN
  Administered 2014-08-18 (×3): 1 mg via ORAL
  Filled 2014-08-18 (×5): qty 2

## 2014-08-18 MED ORDER — FOLIC ACID 1 MG PO TABS
1.0000 mg | ORAL_TABLET | Freq: Every day | ORAL | Status: DC
  Administered 2014-08-18 – 2014-08-19 (×2): 1 mg via ORAL
  Filled 2014-08-18 (×2): qty 1

## 2014-08-18 MED ORDER — PROMETHAZINE HCL 25 MG PO TABS: 25.0000 mg | ORAL_TABLET | Freq: Four times a day (QID) | ORAL | Status: DC | PRN

## 2014-08-18 MED ORDER — AMLODIPINE BESYLATE 10 MG PO TABS
10.0000 mg | ORAL_TABLET | Freq: Two times a day (BID) | ORAL | Status: DC
  Administered 2014-08-18 (×2): 10 mg via ORAL
  Filled 2014-08-18 (×3): qty 1

## 2014-08-18 MED ORDER — CLOPIDOGREL BISULFATE 75 MG PO TABS
75.0000 mg | ORAL_TABLET | Freq: Every day | ORAL | Status: DC
  Administered 2014-08-18 – 2014-08-19 (×2): 75 mg via ORAL
  Filled 2014-08-18 (×2): qty 1

## 2014-08-18 MED ORDER — VENLAFAXINE HCL ER 75 MG PO CP24
150.0000 mg | ORAL_CAPSULE | Freq: Every day | ORAL | Status: DC
  Administered 2014-08-19: 150 mg via ORAL
  Filled 2014-08-18: qty 2

## 2014-08-18 MED ORDER — MORPHINE SULFATE 15 MG PO TABS
15.0000 mg | ORAL_TABLET | ORAL | Status: DC
  Administered 2014-08-18 – 2014-08-19 (×6): 15 mg via ORAL
  Filled 2014-08-18 (×7): qty 1

## 2014-08-18 MED ORDER — MIRTAZAPINE 15 MG PO TABS
45.0000 mg | ORAL_TABLET | Freq: Every evening | ORAL | Status: DC | PRN
  Administered 2014-08-18 – 2014-08-19 (×2): 45 mg via ORAL
  Filled 2014-08-18 (×2): qty 3

## 2014-08-18 NOTE — Progress Notes (Signed)
PATIENT DETAILS Name: [name] Age: 49 y.o. Sex: female Date of Birth: May 26, 1965 Admit Date: 08/17/2014 Admitting Physician Phillips Grout, MD SFK:[email or web address],YFVCBS, MD  Subjective: speech clear-claims to have ongoing left-sided weakness.  Assessment/Plan: Principal Problem:   Left sided weakness: Admitted and seen by neurology, MRI of the brain negative. Discussed case with Dr. Marcie Mowers suggests that this is conversion disorder and patient does not have acute CVA. Psychiatry consulted, have reassured patient that she has not had a CVA. Await physical therapy evaluation, suspect home tomorrow.   Active Problems:   Conversion disorder: Psychiatry consulted, reassurance/supportive care provided    Essential hypertension: Controlled continue with amlodipine, and lisinopril.    Chronic pain syndrome-questionable narcotic seeking behavior:claims she is on chronic narcotics at home suspect if I stop them she will withdraw. Will not escalate further narcotics.    Reported history of breast cancer: Claims patient follows with "Dr Ulanda Edison" at [facility], [place]-however is not able to provide me with further details. I tried searching the Internet to see if I could locate the provider-I was unsuccessful. Surprising that this patient does not have contact information for her oncologist-she claims she got  chemotherapy last week!    Disposition: Remain inpatient  Antimicrobial agents  See below  Anti-infectives    None      DVT Prophylaxis: Prophylactic Lovenox   Code Status: Full code   Family Communication None at bedside  Procedures: None  CONSULTS:  neurology and psychiatry   MEDICATIONS: Scheduled Meds: . amLODipine  10 mg Oral BID  . clopidogrel  75 mg Oral Daily  . folic acid  1 mg Oral Daily  . hydroxyurea  500 mg Oral BID  . lisinopril  10 mg Oral q morning - 10a  . morphine  15 mg Oral 6 times per day  . sodium chloride  3 mL  Intravenous Q12H  . sodium chloride  3 mL Intravenous Q12H  . [START ON 08/19/2014] venlafaxine XR  150 mg Oral Daily   Continuous Infusions:  PRN Meds:.sodium chloride, ALPRAZolam, diphenhydrAMINE, mirtazapine, promethazine, sodium chloride    PHYSICAL EXAM: Vital signs in last 24 hours: Filed Vitals:   08/18/14 0145 08/18/14 0956 08/18/14 1107 08/18/14 1443  BP: 108/83 112/70 118/80 118/71  Pulse: 86 85 82 98  Temp: 98.8 F (37.1 C) 98.3 F (36.8 C) 98.8 F (37.1 C) 98.1 F (36.7 C)  TempSrc: Oral Oral Oral Oral  Resp: 22 20 20 20   Weight: 94.348 kg (208 lb)     SpO2: 99% 96% 100% 100%    Weight change:  Filed Weights   08/18/14 0145  Weight: 94.348 kg (208 lb)   Body mass index is 35.69 kg/(m^2).   Gen Exam: Awake and alert with clear speech.   Neck: Supple, No JVD.   Chest: B/L Clear.   CVS: S1 S2 Regular, no murmurs.  Abdomen: soft, BS +, non tender, non distended.  Extremities: no edema, lower extremities warm to touch. Neurologic:Not moving left side    Skin: No Rash.   Wounds: N/A.    Intake/Output from previous day:  Intake/Output Summary (Last 24 hours) at 08/18/14 1623 Last data filed at 08/18/14 1444  Gross per 24 hour  Intake    240 ml  Output      0 ml  Net    240 ml     LAB RESULTS: CBC  Recent Labs Lab 08/17/14 1723 08/17/14 1739  WBC 8.1  --  HGB 12.1 12.9  HCT 36.7 38.0  PLT 322  --   MCV 70.6*  --   MCH 23.3*  --   MCHC 33.0  --   RDW 15.5  --   LYMPHSABS 3.7  --   MONOABS 0.6  --   EOSABS 0.1  --   BASOSABS 0.0  --     Chemistries   Recent Labs Lab 08/17/14 1723 08/17/14 1739  NA 139 141  K 3.8 3.8  CL 106 106  CO2 22  --   GLUCOSE 90 91  BUN 7 8  CREATININE 0.72 0.60  CALCIUM 9.4  --     CBG: No results for input(s): GLUCAP in the last 168 hours.  GFR CrCl cannot be calculated (Unknown ideal weight.).  Coagulation profile  Recent Labs Lab 08/17/14 1723  INR 1.06    Cardiac Enzymes No results  for input(s): CKMB, TROPONINI, MYOGLOBIN in the last 168 hours.  Invalid input(s): CK  Invalid input(s): POCBNP No results for input(s): DDIMER in the last 72 hours. No results for input(s): HGBA1C in the last 72 hours. No results for input(s): CHOL, HDL, LDLCALC, TRIG, CHOLHDL, LDLDIRECT in the last 72 hours. No results for input(s): TSH, T4TOTAL, T3FREE, THYROIDAB in the last 72 hours.  Invalid input(s): FREET3 No results for input(s): VITAMINB12, FOLATE, FERRITIN, TIBC, IRON, RETICCTPCT in the last 72 hours. No results for input(s): LIPASE, AMYLASE in the last 72 hours.  Urine Studies No results for input(s): UHGB, CRYS in the last 72 hours.  Invalid input(s): UACOL, UAPR, USPG, UPH, UTP, UGL, UKET, UBIL, UNIT, UROB, ULEU, UEPI, UWBC, URBC, UBAC, CAST, UCOM, BILUA  MICROBIOLOGY: No results found for this or any previous visit (from the past 240 hour(s)).  RADIOLOGY STUDIES/RESULTS: Ct Head Wo Contrast  08/17/2014   CLINICAL DATA:  Left facial droop and slurred speech.  EXAM: CT HEAD WITHOUT CONTRAST  TECHNIQUE: Contiguous axial images were obtained from the base of the skull through the vertex without intravenous contrast.  COMPARISON:  Head CT scan 10/15/2012.  FINDINGS: The brain appears normal without hemorrhage, infarct, mass lesion, mass effect, midline shift or abnormal extra-axial fluid collection. No hydrocephalus or pneumocephalus. The calvarium is intact imaged paranasal sinuses and mastoid air cells are clear.  IMPRESSION: Negative head CT. Findings called to Dr. Regenia Skeeter at the time of interpretation.   Electronically Signed   By: Inge Rise M.D.   On: 08/17/2014 17:16   Mr Brain Wo Contrast  08/17/2014   CLINICAL DATA:  Code stroke. Suspected conversion reaction. Possible facial droop and slurred speech.  EXAM: MRI HEAD WITHOUT CONTRAST  TECHNIQUE: Multiplanar, multiecho pulse sequences of the brain and surrounding structures were obtained without intravenous  contrast.  COMPARISON:  CT head earlier in the day.  FINDINGS: Only the tibiae sequence was obtained.  This was normal.  IMPRESSION: Negative exam.  No evidence for acute stroke.  Critical Value/emergent results were discussed in person at the time of interpretation on 08/17/2014 at 6:03 pm to Dr. Jim Like , who verbally acknowledged these results.   Electronically Signed   By: Rolla Flatten M.D.   On: 08/17/2014 18:03    Oren Binet, MD  [facility] Pager:[phone]  If 7PM-7AM, please contact night-coverage [email or web address] Password [facility] 08/18/2014, 4:23 PM

## 2014-08-18 NOTE — Progress Notes (Signed)
Pt. Upset that she would not be getting the medications that she asked for. Stated "I will take my own." I asked if they were in her purse and she said yes. I informed her about the policy that they would need to be taken home or to the pharmacy to which she replied "You will not take my medicine." Pt. Only taking anxiety medicine and pain medicine at this time. She has been educated on the risks. Joaquin Bend E, RN 08/18/2014 3:25 AM

## 2014-08-18 NOTE — H&P (Signed)
PCP:   Hubbard Robinson, MD   Chief Complaint:  Left sided weak  HPI: 49 yo female h/o tia, somatization disorder, htn, CAD comes in with acute onset of left sided hemiparesis with associated slurred speech that have been persistent since PTA.  Pt reports h/o several TIA and strokes in her past.  Denies any h/o migraines.  She reports she was having a right sided headache with this tonight also.  Has never been told she has seizures, migraines, or conversion disorder in the past per her report.  Denies any fevers.  No n/v/d.  There has been no witness of her moving her left arm or leg since arrival to ED.  An emergent MRI was done per neurology request which was normal tonight.  Neurology recommended treating her migraine, with hopes of resolving the rest of her symptoms.  This has not occurred.  Pt is requesting mainly iv narcotics.  Review of Systems:  Positive and negative as per HPI otherwise all other systems are negative  Past Medical History: Past Medical History  Diagnosis Date  . Somatization disorder   . Depression   . Anxiety   . Beta thalassemia trait   . CVA (cerebral infarction)   . Pancreatitis   . Breast cancer   . Hypertension   . Coronary artery disease    Past Surgical History  Procedure Laterality Date  . Tubal ligation    . Cesarean section    . Partial hysterectomy    . Cardiac catheterization    . Breast lumpectomy      Medications: Prior to Admission medications   Medication Sig Start Date End Date Taking? Authorizing Provider  albuterol (PROVENTIL HFA;VENTOLIN HFA) 108 (90 BASE) MCG/ACT inhaler Inhale 2 puffs into the lungs every 8 (eight) hours as needed for shortness of breath.   Yes Historical Provider, MD  ALPRAZolam Duanne Moron) 1 MG tablet Take 1 mg by mouth 3 (three) times daily as needed for anxiety.    Yes Historical Provider, MD  amLODipine (NORVASC) 10 MG tablet Take 10 mg by mouth 2 (two) times daily.   Yes Historical Provider, MD  clopidogrel  (PLAVIX) 75 MG tablet Take 75 mg by mouth daily.   Yes Historical Provider, MD  Cyanocobalamin (B-12 PO) Take 1 tablet by mouth daily.   Yes Historical Provider, MD  folic acid (FOLVITE) 1 MG tablet Take 1 mg by mouth daily.   Yes Historical Provider, MD  furosemide (LASIX) 20 MG tablet Take 20 mg by mouth daily as needed for edema.    Yes Historical Provider, MD  hydroxyurea (HYDREA) 500 MG capsule Take 500 mg by mouth 2 (two) times daily. May take with food to minimize GI side effects.   Yes Historical Provider, MD  lisinopril (PRINIVIL,ZESTRIL) 10 MG tablet Take 10 mg by mouth every morning.   Yes Historical Provider, MD  mirtazapine (REMERON) 15 MG tablet Take 45 mg by mouth at bedtime as needed (for mood, appetite and insomnia).    Yes Historical Provider, MD  morphine (MS CONTIN) 15 MG 12 hr tablet Take 15 mg by mouth every 4 (four) hours.   Yes Historical Provider, MD  OLANZapine (ZYPREXA) 20 MG tablet Take 20 mg by mouth at bedtime.   Yes Historical Provider, MD  oxyCODONE (ROXICODONE) 15 MG immediate release tablet Take 15 mg by mouth every 6 (six) hours as needed for pain.   Yes Historical Provider, MD  promethazine (PHENERGAN) 25 MG tablet Take 1 tablet (25 mg total) by  mouth every 8 (eight) hours as needed for nausea. 11/19/12  Yes Rolland Porter, MD  ranitidine (ZANTAC) 150 MG tablet Take 150 mg by mouth at bedtime.   Yes Historical Provider, MD  tiZANidine (ZANAFLEX) 4 MG tablet Take 4 mg by mouth every 8 (eight) hours as needed (for muscle pains/cramps).   Yes Historical Provider, MD  venlafaxine XR (EFFEXOR-XR) 75 MG 24 hr capsule Take 75 mg by mouth daily.   Yes Historical Provider, MD  Vitamin D, Ergocalciferol, (DRISDOL) 50000 UNITS CAPS capsule Take 50,000 Units by mouth every Wednesday.   Yes Historical Provider, MD  zolpidem (AMBIEN) 10 MG tablet Take 10 mg by mouth at bedtime as needed for sleep.   Yes Historical Provider, MD  amitriptyline (ELAVIL) 25 MG tablet Take 25-50 mg by  mouth at bedtime as needed (for mood or imsomnia).    Historical Provider, MD  ciprofloxacin (CIPRO) 500 MG tablet Take 500 mg by mouth daily.    Historical Provider, MD  docusate sodium (COLACE) 100 MG capsule Take 100 mg by mouth daily.    Historical Provider, MD  nitroGLYCERIN (NITROSTAT) 0.4 MG SL tablet Place 0.4 mg under the tongue every 5 (five) minutes as needed for chest pain.    Historical Provider, MD  omeprazole (PRILOSEC) 20 MG capsule Take 20 mg by mouth 2 (two) times daily.    Historical Provider, MD  oxyCODONE-acetaminophen (PERCOCET) 5-325 MG per tablet Take 1 tablet by mouth every 4 (four) hours as needed for pain. 12/10/12   Ernestina Patches, MD  potassium chloride SA (K-DUR,KLOR-CON) 20 MEQ tablet Take 20 mEq by mouth daily.    Historical Provider, MD  sulfamethoxazole-trimethoprim (SEPTRA DS) 800-160 MG per tablet Take 1 tablet by mouth 2 (two) times daily. 01/29/13   Milton Ferguson, MD    Allergies:   Allergies  Allergen Reactions  . Latex Shortness Of Breath, Itching, Swelling and Rash    Also Tongue swelling   . Metoclopramide Hcl Shortness Of Breath, Itching, Swelling and Rash    Also tongue swelling   . Penicillins Shortness Of Breath, Itching, Swelling and Rash    Also tongue swelling   . Lidocaine   . Fentanyl Itching and Rash  . Hydromorphone Hcl Itching and Rash  . Iohexol Itching and Rash  . Ketorolac Tromethamine Itching and Rash  . Ondansetron Hcl Itching and Rash  . Prochlorperazine Edisylate Itching and Rash    Social History:  reports that she quit smoking about 22 months ago. Her smoking use included Cigarettes. She smoked 0.50 packs per day. She does not have any smokeless tobacco history on file. She reports that she uses illicit drugs. She reports that she does not drink alcohol.  Family History: Family History  Problem Relation Age of Onset  . Diabetes Mother   . Diabetes Father   . Hyperlipidemia Mother   . Hyperlipidemia Father   .  Hypertension Mother   . Hypertension Father     Physical Exam: Filed Vitals:   08/17/14 2230 08/17/14 2233 08/17/14 2328 08/17/14 2348  BP: 101/51 101/51 105/67 115/67  Pulse: 99 97 89 92  Temp:      TempSrc:      Resp: 26 20 15 27   SpO2: 97% 97% 98% 100%   General appearance: alert, cooperative and no distress Head: Normocephalic, without obvious abnormality, atraumatic Eyes: negative Nose: Nares normal. Septum midline. Mucosa normal. No drainage or sinus tenderness. Neck: no JVD and supple, symmetrical, trachea midline Lungs: clear to auscultation  bilaterally Heart: regular rate and rhythm, S1, S2 normal, no murmur, click, rub or gallop Abdomen: soft, non-tender; bowel sounds normal; no masses,  no organomegaly Extremities: extremities normal, atraumatic, no cyanosis or edema Pulses: 2+ and symmetric Skin: Skin color, texture, turgor normal. No rashes or lesions Neurologic: Mental status: Alert, oriented, thought content appropriate 0/5 strength left side, nml right side.  CN 2-12 grossly normal althought pt has slurred speech.  Positive hoover.    Labs on Admission:   Recent Labs  08/17/14 1723 08/17/14 1739  NA 139 141  K 3.8 3.8  CL 106 106  CO2 22  --   GLUCOSE 90 91  BUN 7 8  CREATININE 0.72 0.60  CALCIUM 9.4  --     Recent Labs  08/17/14 1723  AST 17  ALT 18  ALKPHOS 56  BILITOT 0.8  PROT 7.6  ALBUMIN 4.1    Recent Labs  08/17/14 1723 08/17/14 1739  WBC 8.1  --   NEUTROABS 3.8  --   HGB 12.1 12.9  HCT 36.7 38.0  MCV 70.6*  --   PLT 322  --    Radiological Exams on Admission: Ct Head Wo Contrast  08/17/2014   CLINICAL DATA:  Left facial droop and slurred speech.  EXAM: CT HEAD WITHOUT CONTRAST  TECHNIQUE: Contiguous axial images were obtained from the base of the skull through the vertex without intravenous contrast.  COMPARISON:  Head CT scan 10/15/2012.  FINDINGS: The brain appears normal without hemorrhage, infarct, mass lesion, mass  effect, midline shift or abnormal extra-axial fluid collection. No hydrocephalus or pneumocephalus. The calvarium is intact imaged paranasal sinuses and mastoid air cells are clear.  IMPRESSION: Negative head CT. Findings called to Dr. Regenia Skeeter at the time of interpretation.   Electronically Signed   By: Inge Rise M.D.   On: 08/17/2014 17:16   Mr Brain Wo Contrast  08/17/2014   CLINICAL DATA:  Code stroke. Suspected conversion reaction. Possible facial droop and slurred speech.  EXAM: MRI HEAD WITHOUT CONTRAST  TECHNIQUE: Multiplanar, multiecho pulse sequences of the brain and surrounding structures were obtained without intravenous contrast.  COMPARISON:  CT head earlier in the day.  FINDINGS: Only the tibiae sequence was obtained.  This was normal.  IMPRESSION: Negative exam.  No evidence for acute stroke.  Critical Value/emergent results were discussed in person at the time of interpretation on 08/17/2014 at 6:03 pm to Dr. Jim Like , who verbally acknowledged these results.   Electronically Signed   By: Rolla Flatten M.D.   On: 08/17/2014 18:03    Assessment/Plan  49 yo female with left hemiparesis, slurred speech with normal MRI brain  Principal Problem:   Left hemiparesis-  Highly suspect conversion disorder or secondary gain.  obs on tele.  No further w/u per neuro team.  Neuro team following.  Resume psych meds tonight, reassess in the am.  May need psych eval.  Active Problems:  Stable unless o/w noted   Essential hypertension-  stable   Gastroparesis   Anxiety-  Resume her prn xanax   Beta thalassemia trait   Depression with somatization   Somatization disorder  chronic pain-  Only resume chronic home pain meds limit iv narcotics  Med rec needs clarification.  obs on tele.  Full code.   [name] A 08/18/2014, 12:01 AM

## 2014-08-18 NOTE — ED Notes (Signed)
Attempted to place patient on tele for admittance, patient removed electrodes and is refusing to cooperate/ not answering questions.

## 2014-08-18 NOTE — Progress Notes (Signed)
Patient refusing to give ordered urine sample, says her urine is not "our business."

## 2014-08-18 NOTE — Progress Notes (Signed)
Pt. Finally allowing [place] to start fluids in port at [facility]. Will continue to monitor. Joaquin Bend E, RN 08/18/2014 2:26 AM

## 2014-08-18 NOTE — Progress Notes (Signed)
Pt. States "I want my Ambien and Roxicodone 15mg  and I want Zofran and Benedryl for the itching the medicine causes me." Still non-compliant with orders put in and will not allow [place] to touch her. Joaquin Bend E, [place] 08/18/2014 2:35 AM

## 2014-08-18 NOTE — Progress Notes (Signed)
Per night RN, patient uncooperative. Patient more cooperative with me this morning. Allowed me a limited assessment where i listened to heart and lung and abdominal sounds, tested strength and sensation in extremities, tested pulses, and assessed her speech. Patient denies she is going to take any home medications and refuses RN to check her purse for medications to send to pharmacy. Bed alarm set. Refuses telemetry. Will cont to monitor

## 2014-08-18 NOTE — Progress Notes (Signed)
UR completed 

## 2014-08-18 NOTE — Consult Note (Signed)
Why Psychiatry Consult   Reason for Consult:  Bipolar depression and conversion disorder Referring Physician:  Dr. Sloan Leiter Patient Identification: [name] MRN:  [id number] Principal Diagnosis: Somatization disorder Diagnosis:   Patient Active Problem List   Diagnosis Date Noted  . Conversion reaction [F44.9]   . Stroke-like symptoms [R29.90]   . Left hemiparesis [G81.90] 08/17/2014  . Somatization disorder [F45.0]   . 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 Encounter for long-term (current) use of other high-risk medications [Z79.899] 10/17/2012  . Cerebral embolism with cerebral infarction [I63.40] 10/15/2012  . Hemiplegia, unspecified, affecting nondominant side [G81.91] 10/15/2012  . Anxiety [F41.9]   . 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 ABDOMINAL PAIN-EPIGASTRIC [R10.13] 02/09/2008  . PERSONAL HX COLONIC POLYPS [Z86.010] 02/09/2008  . Gastroparesis [K31.84] 01/14/2008  . Other thalassemia [D56.8] 11/26/2007  . TOBACCO ABUSE [Z72.0] 11/26/2007  . Essential hypertension [I10] 11/26/2007  . CAD [I25.10] 11/26/2007  . [place] [I96.789, L02.219] 11/26/2007  . DYSPNEA [R06.02] 11/26/2007    Total Time spent with patient: 1 hour  Subjective:   [name] is a 49 y.o. female patient admitted with depression, stress and somatization.Marland Kitchen  HPI:  [name] is a 49 years old female admitted to [facility] with acute onset of left-sided hemiparesis associated slurred speech that has been persistent since prior to admission. Psychiatric consultation requested secondary to increased symptoms of depression, anxiety and negative screening from neurology for stroke. Neurology feels she may have migraine but patient denied having migraines at any time. Patient reported she lives in [place] and visiting her boyfriend in [place] for the last 1 week and required to come to the hospital secondary to weakness  in her left side. Patient has been diagnosed with bipolar disorder for several years ago and has been receiving medication management from a psychiatrist and a counselor in [place], [place]. Patient has a history of acute psychiatric hospitalization in [place], [place] and also history of suicidal ideation in the past. Patient denies current suicidal, homicidal ideation, intention or plan. Patient does not appear to be responding to internal stimuli. 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 Reportedly patient has been seeing her daughter spirits now and then which is comforts her. She has a 3 other children and 2 grandchildren. Patient reportedly on disability and living by herself and her younger son who check on her from time to time. Patient contract for safety at this time and does not meet criteria for acute psychiatric hospitalization. 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  Medical history: 49 yo female h/o tia, somatization disorder, htn, CAD comes in with acute onset of left sided hemiparesis with associated slurred speech that have been persistent since PTA. Pt reports h/o several TIA and strokes in her past. Denies any h/o migraines. She reports she was having a right sided headache with this tonight also. Has never been told she has seizures, migraines, or conversion disorder in the past per her report. Denies any fevers. No n/v/d. There has been no witness of her moving her left arm or leg since arrival to ED. An emergent MRI was done per neurology request which was normal tonight. Neurology recommended treating  her migraine, with hopes of resolving the rest of her  symptoms. This has not occurred. Pt is requesting mainly iv narcotics.  HPI Elements:   Location:  Depression, anxiety and somatization. Quality:  Poor, depressed and dysphoric, unable to care for herself. Severity:  Left-sided weakness without positive scans. Timing:  Been with her boyfriend for 1 week. Duration:  Few days. Context:  Unknown psychosocial stressors and multiple medical problems.  Past Medical History:  Past Medical History  Diagnosis Date  . Somatization disorder   . Depression   . Anxiety   . Beta thalassemia trait   . CVA (cerebral infarction)   . Pancreatitis   . Breast cancer   . Hypertension   . Coronary artery disease     Past Surgical History  Procedure Laterality Date  . Tubal ligation    . Cesarean section    . Partial hysterectomy    . Cardiac catheterization    . Breast lumpectomy     Family History:  Family History  Problem Relation Age of Onset  . Diabetes Mother   . Diabetes Father   . Hyperlipidemia Mother   . Hyperlipidemia Father   . Hypertension Mother   . Hypertension Father    Social History:  History  Alcohol Use No     History  Drug Use  . Yes    Comment: history of drug seeking behavior    History   Social History  . Marital Status: Single    Spouse Name: N/A  . Number of Children: N/A  . Years of Education: N/A   Social History Main Topics  . Smoking status: Former Smoker -- 0.50 packs/day    Types: Cigarettes    Quit date: 10/11/2012  . Smokeless tobacco: Not on file  . Alcohol Use: No  . Drug Use: Yes     Comment: history of drug seeking behavior  . Sexual Activity: Not on file   Other Topics Concern  . None   Social History Narrative   Additional Social History:                          Allergies:   Allergies  Allergen Reactions  . Latex Shortness Of Breath, Itching, Swelling and Rash    Also Tongue swelling   . Metoclopramide Hcl Shortness Of Breath, Itching, Swelling and Rash     Also tongue swelling   . Penicillins Shortness Of Breath, Itching, Swelling and Rash    Also tongue swelling   . Lidocaine   . Fentanyl Itching and Rash  . Hydromorphone Hcl Itching and Rash  . Iohexol Itching and Rash  . Ketorolac Tromethamine Itching and Rash  . Ondansetron Hcl Itching and Rash  . Prochlorperazine Edisylate Itching and Rash    Labs:  Results for orders placed or performed during the hospital encounter of 08/17/14 (from the past 48 hour(s))  Protime-INR     Status: None   Collection Time: 08/17/14  5:23 PM  Result Value Ref Range   Prothrombin Time 13.9 11.6 - 15.2 seconds   INR 1.06 0.00 - 1.49  APTT     Status: Abnormal   Collection Time: 08/17/14  5:23 PM  Result Value Ref Range   aPTT 49 (H) 24 - 37 seconds    Comment:        IF BASELINE aPTT IS ELEVATED, SUGGEST PATIENT RISK ASSESSMENT BE USED TO DETERMINE APPROPRIATE ANTICOAGULANT THERAPY.   CBC  Status: Abnormal   Collection Time: 08/17/14  5:23 PM  Result Value Ref Range   WBC 8.1 4.0 - 10.5 K/uL   RBC 5.20 (H) 3.87 - 5.11 MIL/uL   Hemoglobin 12.1 12.0 - 15.0 g/dL   HCT 36.7 36.0 - 46.0 %   MCV 70.6 (L) 78.0 - 100.0 fL   MCH 23.3 (L) 26.0 - 34.0 pg   MCHC 33.0 30.0 - 36.0 g/dL   RDW 15.5 11.5 - 15.5 %   Platelets 322 150 - 400 K/uL  Differential     Status: None   Collection Time: 08/17/14  5:23 PM  Result Value Ref Range   Neutrophils Relative % 47 43 - 77 %   Neutro Abs 3.8 1.7 - 7.7 K/uL   Lymphocytes Relative 45 12 - 46 %   Lymphs Abs 3.7 0.7 - 4.0 K/uL   Monocytes Relative 7 3 - 12 %   Monocytes Absolute 0.6 0.1 - 1.0 K/uL   Eosinophils Relative 1 0 - 5 %   Eosinophils Absolute 0.1 0.0 - 0.7 K/uL   Basophils Relative 0 0 - 1 %   Basophils Absolute 0.0 0.0 - 0.1 K/uL  Comprehensive metabolic panel     Status: None   Collection Time: 08/17/14  5:23 PM  Result Value Ref Range   Sodium 139 135 - 145 mmol/L   Potassium 3.8 3.5 - 5.1 mmol/L   Chloride 106 96 - 112 mmol/L   CO2  22 19 - 32 mmol/L   Glucose, Bld 90 70 - 99 mg/dL   BUN 7 6 - 23 mg/dL   Creatinine, Ser 0.72 0.50 - 1.10 mg/dL   Calcium 9.4 8.4 - 10.5 mg/dL   Total Protein 7.6 6.0 - 8.3 g/dL   Albumin 4.1 3.5 - 5.2 g/dL   AST 17 0 - 37 U/L   ALT 18 0 - 35 U/L   Alkaline Phosphatase 56 39 - 117 U/L   Total Bilirubin 0.8 0.3 - 1.2 mg/dL   GFR calc non Af Amer >90 >90 mL/min   GFR calc Af Amer >90 >90 mL/min    Comment: (NOTE) The eGFR has been calculated using the CKD EPI equation. This calculation has not been validated in all clinical situations. eGFR's persistently <90 mL/min signify possible Chronic Kidney Disease.    Anion gap 11 5 - 15  I-Stat Troponin, ED (not at [facility])     Status: None   Collection Time: 08/17/14  5:37 PM  Result Value Ref Range   Troponin i, poc 0.00 0.00 - 0.08 ng/mL   Comment 3            Comment: Due to the release kinetics of cTnI, a negative result within the first hours of the onset of symptoms does not rule out myocardial infarction with certainty. If myocardial infarction is still suspected, repeat the test at appropriate intervals.   I-Stat Chem 8, ED     Status: None   Collection Time: 08/17/14  5:39 PM  Result Value Ref Range   Sodium 141 135 - 145 mmol/L   Potassium 3.8 3.5 - 5.1 mmol/L   Chloride 106 96 - 112 mmol/L   BUN 8 6 - 23 mg/dL   Creatinine, Ser 0.60 0.50 - 1.10 mg/dL   Glucose, Bld 91 70 - 99 mg/dL   Calcium, Ion 1.17 1.12 - 1.23 mmol/L   TCO2 20 0 - 100 mmol/L   Hemoglobin 12.9 12.0 - 15.0 g/dL   HCT 38.0  36.0 - 46.0 %    Vitals: Blood pressure 112/70, pulse 85, temperature 98.3 F (36.8 C), temperature source Oral, resp. rate 20, weight 94.348 kg (208 lb), SpO2 96 %.  Risk to Self:   Risk to Others:   Prior Inpatient Therapy:   Prior Outpatient Therapy:    Current Facility-Administered Medications  Medication Dose Route Frequency Provider Last Rate Last Dose  . 0.9 %  sodium chloride infusion  250 mL Intravenous PRN Phillips Grout, MD      . ALPRAZolam Duanne Moron) tablet 1 mg  1 mg Oral TID PRN Phillips Grout, MD   1 mg at 08/18/14 0316  . amLODipine (NORVASC) tablet 10 mg  10 mg Oral BID Phillips Grout, MD   10 mg at 08/18/14 0200  . clopidogrel (PLAVIX) tablet 75 mg  75 mg Oral Daily Phillips Grout, MD      . folic acid (FOLVITE) tablet 1 mg  1 mg Oral Daily Rachal A Shanon Brow, MD      . hydroxyurea (HYDREA) capsule 500 mg  500 mg Oral BID Phillips Grout, MD   500 mg at 08/18/14 0200  . lisinopril (PRINIVIL,ZESTRIL) tablet 10 mg  10 mg Oral q morning - 10a Phillips Grout, MD      . mirtazapine (REMERON) tablet 45 mg  45 mg Oral QHS PRN Phillips Grout, MD   45 mg at 08/18/14 0315  . morphine (MSIR) tablet 15 mg  15 mg Oral 6 times per day Phillips Grout, MD   15 mg at 08/18/14 0745  . sodium chloride 0.9 % injection 3 mL  3 mL Intravenous Q12H Phillips Grout, MD   3 mL at 08/18/14 0130  . sodium chloride 0.9 % injection 3 mL  3 mL Intravenous Q12H Phillips Grout, MD   3 mL at 08/18/14 0130  . sodium chloride 0.9 % injection 3 mL  3 mL Intravenous PRN Phillips Grout, MD      . venlafaxine XR (EFFEXOR-XR) 24 hr capsule 75 mg  75 mg Oral Daily Phillips Grout, MD        Musculoskeletal: Strength & Muscle Tone: decreased Gait & Station: unable to stand Patient leans: N/A  Psychiatric Specialty Exam: Physical Exam as per history and physical   ROS depression, anxiety, stress about last of her daughter about 6 months ago   Blood pressure 112/70, pulse 85, temperature 98.3 F (36.8 C), temperature source Oral, resp. rate 20, weight 94.348 kg (208 lb), SpO2 96 %.Body mass index is 35.69 kg/(m^2).  General Appearance: Disheveled and Guarded  Eye Contact::  Good  Speech:  Clear and Coherent, Slow and Slurred  Volume:  Decreased  Mood:  Anxious, Dysphoric and Irritable  Affect:  Congruent, Depressed and Tearful  Thought Process:  Coherent and Goal Directed  Orientation:  Full (Time, Place, and Person)  Thought Content:   Rumination  Suicidal Thoughts:  No  Homicidal Thoughts:  No  Memory:  Immediate;   Good Recent;   Good  Judgement:  Intact  Insight:  Fair  Psychomotor Activity:  Decreased  Concentration:  Good  Recall:  Good  Fund of Knowledge:Good  Language: Good  Akathisia:  Negative  Handed:  Right  AIMS (if indicated):     Assets:  Communication Skills Desire for Improvement Financial Resources/Insurance Housing Intimacy Leisure Time Resilience Social Support  ADL's:  Intact  Cognition: WNL  Sleep:      Medical Decision  Making: Review of Psycho-Social Stressors (1), Review or order clinical lab tests (1), Discuss test with performing physician (1), New Problem, with no additional work-up planned (3), Review of Medication Regimen & Side Effects (2) and Review of New Medication or Change in Dosage (2)  Treatment Plan Summary: Daily contact with patient to assess and evaluate symptoms and progress in treatment and Medication management  Plan: Continue current psychotropic medication and adjust Effexor XR 150 mg daily for controlling depression and anxiety Patient contract for safety at this time Patient does not meet criteria for psychiatric inpatient admission. Supportive therapy provided about ongoing stressors. Appreciate psychiatric consultation and follow up as clinically required Please contact [phone] or [phone] if needs further assistance  Disposition: Patient will be referred to the outpatient psychiatric services including individual therapy when medically stable.   Sherryann Frese,JANARDHAHA R. 08/18/2014 10:25 AM

## 2014-08-18 NOTE — Progress Notes (Signed)
Pt. Arrived from ED with 2 techs. Pt. Refused to transfer from stretcher to bed without phone cord that was left in ED. I stated I would call ED and get the cord after she was transferred to the bed in the room. Still would not let us touch her and she proceeded to call down to ED from her personal phone to get her charger. Pt. Refused fluids for port-a-cath in right chest, SCD's, Telemetry, Head-to-toe assessment, and review of her history/allergies. Joaquin Bend E, RN 08/18/2014 2:25 AM

## 2014-08-19 DIAGNOSIS — G8190 Hemiplegia, unspecified affecting unspecified side: Secondary | ICD-10-CM | POA: Diagnosis not present

## 2014-08-19 DIAGNOSIS — I10 Essential (primary) hypertension: Secondary | ICD-10-CM | POA: Diagnosis not present

## 2014-08-19 DIAGNOSIS — G894 Chronic pain syndrome: Secondary | ICD-10-CM | POA: Diagnosis not present

## 2014-08-19 DIAGNOSIS — F450 Somatization disorder: Secondary | ICD-10-CM | POA: Diagnosis not present

## 2014-08-19 DIAGNOSIS — F3130 Bipolar disorder, current episode depressed, mild or moderate severity, unspecified: Secondary | ICD-10-CM | POA: Diagnosis not present

## 2014-08-19 MED ORDER — SODIUM CHLORIDE 0.9 % IJ SOLN: 10.0000 mL | INTRAMUSCULAR | Status: DC | PRN

## 2014-08-19 MED ORDER — HEPARIN SOD (PORK) LOCK FLUSH 100 UNIT/ML IV SOLN
500.0000 [IU] | INTRAVENOUS | Status: AC | PRN
  Administered 2014-08-19: 500 [IU]

## 2014-08-19 NOTE — Consult Note (Signed)
Psychiatry Consult follow up  Reason for Consult:  Bipolar depression and conversion disorder Referring Physician:  Dr. Sloan Leiter Patient Identification: [name] MRN:  [id number] Principal Diagnosis: Somatization disorder Diagnosis:   Patient Active Problem List   Diagnosis Date Noted  . Conversion reaction [F44.9]   . Stroke-like symptoms [R29.90]   . Left hemiparesis [G81.90] 08/17/2014  . Somatization disorder [F45.0]   . 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 Encounter for long-term (current) use of other high-risk medications [Z79.899] 10/17/2012  . Cerebral embolism with cerebral infarction [I63.40] 10/15/2012  . Hemiplegia, unspecified, affecting nondominant side [G81.91] 10/15/2012  . Anxiety [F41.9]   . 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 ABDOMINAL PAIN-EPIGASTRIC [R10.13] 02/09/2008  . PERSONAL HX COLONIC POLYPS [Z86.010] 02/09/2008  . Gastroparesis [K31.84] 01/14/2008  . Other thalassemia [D56.8] 11/26/2007  . TOBACCO ABUSE [Z72.0] 11/26/2007  . Essential hypertension [I10] 11/26/2007  . CAD [I25.10] 11/26/2007  . Parmele [I37.048, L02.219] 11/26/2007  . DYSPNEA [R06.02] 11/26/2007    Total Time spent with patient: 20 minutes  Subjective:   [name] is a 49 y.o. female patient admitted with depression, stress and somatization.Marland Kitchen  HPI:  [name] is a 49 years old female admitted to [facility] with acute onset of left-sided hemiparesis associated slurred speech that has been persistent since prior to admission. Psychiatric consultation requested secondary to increased symptoms of depression, anxiety and negative screening from neurology for stroke. Neurology feels she may have migraine but patient denied having migraines at any time. Patient reported she lives in [place] and visiting her boyfriend in [place] for the last 1 week and required to come to the hospital secondary to weakness in  her left side. Patient has been diagnosed with bipolar disorder for several years ago and has been receiving medication management from a psychiatrist and a counselor in [place], [place]. Patient has a history of acute psychiatric hospitalization in [place], [place] and also history of suicidal ideation in the past. Patient denies current suicidal, homicidal ideation, intention or plan. Patient does not appear to be responding to internal stimuli. 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 Reportedly patient has been seeing her daughter spirits now and then which is comforts her. She has a 3 other children and 2 grandchildren. Patient reportedly on disability and living by herself and her younger son who check on her from time to time. Patient contract for safety at this time and does not meet criteria for acute psychiatric hospitalization. 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  Interval History: Patient seen for psychiatric consultation follow-up today. Patient has no afferent emotional or behavioral problems, dressed up with their regular street clothes and charging her cell phone when I walk into her room. Patient reported she has no more weakness on her leg and hand and able to walk fine without any difficulties. Patient is willing to follow up with outpatient psychiatric services at [place], [place] with her primary providers. Patient has no safety concerns and contract for safety at the time of discharge. Patient has no reported irritability, agitation, aggressive behavior or psychosis. Patient stated mood is good and affect is appropriate.    Past Medical History:  Past  Medical History  Diagnosis Date  . Somatization disorder   . Depression   . Anxiety   . Beta thalassemia trait   . CVA (cerebral infarction)   . Pancreatitis   . Breast cancer   . Hypertension   . Coronary artery disease     Past Surgical History  Procedure Laterality Date  . Tubal ligation    . Cesarean section    . Partial hysterectomy    . Cardiac catheterization    . Breast lumpectomy     Family History:  Family History  Problem Relation Age of Onset  . Diabetes Mother   . Diabetes Father   . Hyperlipidemia Mother   . Hyperlipidemia Father   . Hypertension Mother   . Hypertension Father    Social History:  History  Alcohol Use No     History  Drug Use  . Yes    Comment: history of drug seeking behavior    History   Social History  . Marital Status: Single    Spouse Name: N/A  . Number of Children: N/A  . Years of Education: N/A   Social History Main Topics  . Smoking status: Former Smoker -- 0.50 packs/day    Types: Cigarettes    Quit date: 10/11/2012  . Smokeless tobacco: Not on file  . Alcohol Use: No  . Drug Use: Yes     Comment: history of drug seeking behavior  . Sexual Activity: Not on file   Other Topics Concern  . None   Social History Narrative   Additional Social History:                          Allergies:   Allergies  Allergen Reactions  . Latex Shortness Of Breath, Itching, Swelling and Rash    Also Tongue swelling   . Metoclopramide Hcl Shortness Of Breath, Itching, Swelling and Rash    Also tongue swelling   . Penicillins Shortness Of Breath, Itching, Swelling and Rash    Also tongue swelling   . Lidocaine   . Fentanyl Itching and Rash  . Hydromorphone Hcl Itching and Rash  . Iohexol Itching and Rash  . Ketorolac Tromethamine Itching and Rash  . Ondansetron Hcl Itching and Rash  . Prochlorperazine Edisylate Itching and Rash    Labs:  Results for orders placed or performed during the hospital encounter  of 08/17/14 (from the past 48 hour(s))  Protime-INR     Status: None   Collection Time: 08/17/14  5:23 PM  Result Value Ref Range   Prothrombin Time 13.9 11.6 - 15.2 seconds   INR 1.06 0.00 - 1.49  APTT     Status: Abnormal   Collection Time: 08/17/14  5:23 PM  Result Value Ref Range   aPTT 49 (H) 24 - 37 seconds    Comment:        IF BASELINE aPTT IS ELEVATED, SUGGEST PATIENT RISK ASSESSMENT BE USED TO DETERMINE APPROPRIATE ANTICOAGULANT THERAPY.   CBC     Status: Abnormal   Collection Time: 08/17/14  5:23 PM  Result Value Ref Range   WBC 8.1 4.0 - 10.5 K/uL   RBC 5.20 (H) 3.87 - 5.11 MIL/uL   Hemoglobin 12.1 12.0 - 15.0 g/dL   HCT 36.7 36.0 - 46.0 %   MCV 70.6 (L) 78.0 - 100.0 fL   MCH 23.3 (L) 26.0 - 34.0 pg   MCHC 33.0 30.0 - 36.0 g/dL  RDW 15.5 11.5 - 15.5 %   Platelets 322 150 - 400 K/uL  Differential     Status: None   Collection Time: 08/17/14  5:23 PM  Result Value Ref Range   Neutrophils Relative % 47 43 - 77 %   Neutro Abs 3.8 1.7 - 7.7 K/uL   Lymphocytes Relative 45 12 - 46 %   Lymphs Abs 3.7 0.7 - 4.0 K/uL   Monocytes Relative 7 3 - 12 %   Monocytes Absolute 0.6 0.1 - 1.0 K/uL   Eosinophils Relative 1 0 - 5 %   Eosinophils Absolute 0.1 0.0 - 0.7 K/uL   Basophils Relative 0 0 - 1 %   Basophils Absolute 0.0 0.0 - 0.1 K/uL  Comprehensive metabolic panel     Status: None   Collection Time: 08/17/14  5:23 PM  Result Value Ref Range   Sodium 139 135 - 145 mmol/L   Potassium 3.8 3.5 - 5.1 mmol/L   Chloride 106 96 - 112 mmol/L   CO2 22 19 - 32 mmol/L   Glucose, Bld 90 70 - 99 mg/dL   BUN 7 6 - 23 mg/dL   Creatinine, Ser 0.72 0.50 - 1.10 mg/dL   Calcium 9.4 8.4 - 10.5 mg/dL   Total Protein 7.6 6.0 - 8.3 g/dL   Albumin 4.1 3.5 - 5.2 g/dL   AST 17 0 - 37 U/L   ALT 18 0 - 35 U/L   Alkaline Phosphatase 56 39 - 117 U/L   Total Bilirubin 0.8 0.3 - 1.2 mg/dL   GFR calc non Af Amer >90 >90 mL/min   GFR calc Af Amer >90 >90 mL/min    Comment: (NOTE) The eGFR  has been calculated using the CKD EPI equation. This calculation has not been validated in all clinical situations. eGFR's persistently <90 mL/min signify possible Chronic Kidney Disease.    Anion gap 11 5 - 15  I-Stat Troponin, ED (not at [facility])     Status: None   Collection Time: 08/17/14  5:37 PM  Result Value Ref Range   Troponin i, poc 0.00 0.00 - 0.08 ng/mL   Comment 3            Comment: Due to the release kinetics of cTnI, a negative result within the first hours of the onset of symptoms does not rule out myocardial infarction with certainty. If myocardial infarction is still suspected, repeat the test at appropriate intervals.   I-Stat Chem 8, ED     Status: None   Collection Time: 08/17/14  5:39 PM  Result Value Ref Range   Sodium 141 135 - 145 mmol/L   Potassium 3.8 3.5 - 5.1 mmol/L   Chloride 106 96 - 112 mmol/L   BUN 8 6 - 23 mg/dL   Creatinine, Ser 0.60 0.50 - 1.10 mg/dL   Glucose, Bld 91 70 - 99 mg/dL   Calcium, Ion 1.17 1.12 - 1.23 mmol/L   TCO2 20 0 - 100 mmol/L   Hemoglobin 12.9 12.0 - 15.0 g/dL   HCT 38.0 36.0 - 46.0 %    Vitals: Blood pressure 126/65, pulse 95, temperature 98.7 F (37.1 C), temperature source Oral, resp. rate 20, weight 97.841 kg (215 lb 11.2 oz), SpO2 98 %.  Risk to Self:   Risk to Others:   Prior Inpatient Therapy:   Prior Outpatient Therapy:    Current Facility-Administered Medications  Medication Dose Route Frequency Provider Last Rate Last Dose  . 0.9 %  sodium chloride  infusion  250 mL Intravenous PRN Phillips Grout, MD      . ALPRAZolam Duanne Moron) tablet 1 mg  1 mg Oral TID PRN Phillips Grout, MD   1 mg at 08/18/14 1945  . amLODipine (NORVASC) tablet 10 mg  10 mg Oral BID Phillips Grout, MD   10 mg at 08/18/14 2152  . clopidogrel (PLAVIX) tablet 75 mg  75 mg Oral Daily Phillips Grout, MD   75 mg at 08/19/14 1941  . diphenhydrAMINE (BENADRYL) capsule 25 mg  25 mg Oral Q6H PRN Jonetta Osgood, MD   25 mg at 08/19/14 0002  .  enoxaparin (LOVENOX) injection 40 mg  40 mg Subcutaneous Q24H Jonetta Osgood, MD   40 mg at 08/18/14 1731  . folic acid (FOLVITE) tablet 1 mg  1 mg Oral Daily Phillips Grout, MD   1 mg at 08/19/14 7408  . hydroxyurea (HYDREA) capsule 500 mg  500 mg Oral BID Phillips Grout, MD   500 mg at 08/19/14 1448  . lisinopril (PRINIVIL,ZESTRIL) tablet 10 mg  10 mg Oral q morning - 10a Phillips Grout, MD   10 mg at 08/18/14 1049  . mirtazapine (REMERON) tablet 45 mg  45 mg Oral QHS PRN Phillips Grout, MD   45 mg at 08/19/14 0002  . morphine (MSIR) tablet 15 mg  15 mg Oral 6 times per day Phillips Grout, MD   15 mg at 08/19/14 0003  . promethazine (PHENERGAN) tablet 25 mg  25 mg Oral Q6H PRN Shanker Kristeen Mans, MD      . sodium chloride 0.9 % injection 10-40 mL  10-40 mL Intracatheter PRN Shanker Kristeen Mans, MD      . sodium chloride 0.9 % injection 3 mL  3 mL Intravenous Q12H Phillips Grout, MD   3 mL at 08/18/14 0130  . sodium chloride 0.9 % injection 3 mL  3 mL Intravenous Q12H Phillips Grout, MD   3 mL at 08/18/14 0130  . sodium chloride 0.9 % injection 3 mL  3 mL Intravenous PRN Phillips Grout, MD      . venlafaxine XR (EFFEXOR-XR) 24 hr capsule 150 mg  150 mg Oral Daily Ambrose Finland, MD   150 mg at 08/19/14 1856    Musculoskeletal: Strength & Muscle Tone: decreased Gait & Station: unable to stand Patient leans: N/A  Psychiatric Specialty Exam: Physical Exam as per history and physical   ROS depression, anxiety, stress about last of her daughter about 6 months ago   Blood pressure 126/65, pulse 95, temperature 98.7 F (37.1 C), temperature source Oral, resp. rate 20, weight 97.841 kg (215 lb 11.2 oz), SpO2 98 %.Body mass index is 37.01 kg/(m^2).  General Appearance: Disheveled and Guarded  Eye Contact::  Good  Speech:  Clear and Coherent, Slow and Slurred  Volume:  Decreased  Mood:  Anxious, Dysphoric and Irritable  Affect:  Congruent, Depressed and Tearful  Thought Process:   Coherent and Goal Directed  Orientation:  Full (Time, Place, and Person)  Thought Content:  Rumination  Suicidal Thoughts:  No  Homicidal Thoughts:  No  Memory:  Immediate;   Good Recent;   Good  Judgement:  Intact  Insight:  Fair  Psychomotor Activity:  Decreased  Concentration:  Good  Recall:  Good  Fund of Knowledge:Good  Language: Good  Akathisia:  Negative  Handed:  Right  AIMS (if indicated):     Assets:  Communication  Skills Desire for Improvement Financial Resources/Insurance Housing Intimacy Leisure Time Resilience Social Support  ADL's:  Intact  Cognition: WNL  Sleep:      Medical Decision Making: Review of Psycho-Social Stressors (1), Review or order clinical lab tests (1), Discuss test with performing physician (1), New Problem, with no additional work-up planned (3), Review of Medication Regimen & Side Effects (2) and Review of New Medication or Change in Dosage (2)  Treatment Plan Summary: Daily contact with patient to assess and evaluate symptoms and progress in treatment and Medication management  Plan: Continue current psychotropic medication including Effexor XR 150 mg daily for controlling depression Patient contract for safety at this time Patient does not meet criteria for psychiatric inpatient admission. Supportive therapy provided about ongoing stressors. Appreciate psychiatric consultation and sign off today Please contact [phone] or [phone] if needs further assistance  Disposition: Patient will be referred to the outpatient psychiatric services including individual therapy when medically stable.   ,JANARDHAHA R. 08/19/2014 11:28 AM

## 2014-08-19 NOTE — Progress Notes (Signed)
RN informed by MD that pt was walking down hall to exit the unit. RN asked pt to go back to her room. Pt stated that she was going downstairs to smoke and that she would be back. RN asked pt where her IV pole and tubing was. Pt stated that she had taken off the tubing and clamped off her port-a-cath. Pt proceeded to walk downstairs. MD aware. Pt came back to unit a few minutes later and went back to her room. Awaiting transportation for pt to be taken home and for IV team to deaccess her port-a-cath. Pt notified and verbalized understanding. Will continue to monitor.

## 2014-08-19 NOTE — Discharge Summary (Addendum)
PATIENT DETAILS Name: [name] Age: 49 y.o. Sex: female Date of Birth: 1965/11/29 MRN: [id number]. Admitting Physician: Phillips Grout, MD OEV:[id number],HWEXHB, MD  Admit Date: 08/17/2014 Discharge date: 08/19/2014  Recommendations for Outpatient Follow-up:  1. Please consider referral to psychiatry as an outpatient 2. Please consider minimizing polypharmacy/slowly tapering off narcotics   PRIMARY DISCHARGE DIAGNOSIS:  Principal Problem:   Somatization disorder Active Problems:   Essential hypertension   Gastroparesis   Anxiety   Beta thalassemia trait   Depression with somatization   Left hemiparesis      PAST MEDICAL HISTORY: Past Medical History  Diagnosis Date  . Somatization disorder   . Depression   . Anxiety   . Beta thalassemia trait   . CVA (cerebral infarction)   . Pancreatitis   . Breast cancer   . Hypertension   . Coronary artery disease     DISCHARGE MEDICATIONS: Current Discharge Medication List    CONTINUE these medications which have NOT CHANGED   Details  albuterol (PROVENTIL HFA;VENTOLIN HFA) 108 (90 BASE) MCG/ACT inhaler Inhale 2 puffs into the lungs every 8 (eight) hours as needed for shortness of breath.    ALPRAZolam (XANAX) 1 MG tablet Take 1 mg by mouth 3 (three) times daily as needed for anxiety.     amLODipine (NORVASC) 10 MG tablet Take 10 mg by mouth 2 (two) times daily.    clopidogrel (PLAVIX) 75 MG tablet Take 75 mg by mouth daily.    Cyanocobalamin (B-12 PO) Take 1 tablet by mouth daily.    folic acid (FOLVITE) 1 MG tablet Take 1 mg by mouth daily.    furosemide (LASIX) 20 MG tablet Take 20 mg by mouth daily as needed for edema.     hydroxyurea (HYDREA) 500 MG capsule Take 500 mg by mouth 2 (two) times daily. May take with food to minimize GI side effects.    lisinopril (PRINIVIL,ZESTRIL) 10 MG tablet Take 10 mg by mouth every morning.    mirtazapine (REMERON) 15 MG tablet Take 45 mg by mouth at bedtime as needed  (for mood, appetite and insomnia).     OLANZapine (ZYPREXA) 20 MG tablet Take 20 mg by mouth at bedtime.    oxyCODONE (ROXICODONE) 15 MG immediate release tablet Take 15 mg by mouth every 6 (six) hours as needed for pain.    promethazine (PHENERGAN) 25 MG tablet Take 1 tablet (25 mg total) by mouth every 8 (eight) hours as needed for nausea. Qty: 15 tablet, Refills: 0    ranitidine (ZANTAC) 150 MG tablet Take 150 mg by mouth at bedtime.    tiZANidine (ZANAFLEX) 4 MG tablet Take 4 mg by mouth every 8 (eight) hours as needed (for muscle pains/cramps).    venlafaxine XR (EFFEXOR-XR) 75 MG 24 hr capsule Take 75 mg by mouth daily.    Vitamin D, Ergocalciferol, (DRISDOL) 50000 UNITS CAPS capsule Take 50,000 Units by mouth every Wednesday.    zolpidem (AMBIEN) 10 MG tablet Take 10 mg by mouth at bedtime as needed for sleep.    nitroGLYCERIN (NITROSTAT) 0.4 MG SL tablet Place 0.4 mg under the tongue every 5 (five) minutes as needed for chest pain.      STOP taking these medications     morphine (MS CONTIN) 15 MG 12 hr tablet      amitriptyline (ELAVIL) 25 MG tablet      ciprofloxacin (CIPRO) 500 MG tablet      docusate sodium (COLACE) 100 MG capsule  omeprazole (PRILOSEC) 20 MG capsule      oxyCODONE-acetaminophen (PERCOCET) 5-325 MG per tablet      potassium chloride SA (K-DUR,KLOR-CON) 20 MEQ tablet      sulfamethoxazole-trimethoprim (SEPTRA DS) 800-160 MG per tablet         ALLERGIES:   Allergies  Allergen Reactions  . Latex Shortness Of Breath, Itching, Swelling and Rash    Also Tongue swelling   . Metoclopramide Hcl Shortness Of Breath, Itching, Swelling and Rash    Also tongue swelling   . Penicillins Shortness Of Breath, Itching, Swelling and Rash    Also tongue swelling   . Lidocaine   . Fentanyl Itching and Rash  . Hydromorphone Hcl Itching and Rash  . Iohexol Itching and Rash  . Ketorolac Tromethamine Itching and Rash  . Ondansetron Hcl Itching and  Rash  . Prochlorperazine Edisylate Itching and Rash    BRIEF HPI:  See H&P, Labs, Consult and Test reports for all details in brief, patient is a 49 year old female with a reported history of hypertension, questionable prior CVA/TIA presented with acute onset of left-sided weakness. Patient was seen by neurology, MRI of the brain was negative, patient was thought to have a conversion disorder-and admitted for further evaluation and treatment.  CONSULTATIONS:   neurology and psychiatry  PERTINENT RADIOLOGIC STUDIES: Ct Head Wo Contrast  08/17/2014   CLINICAL DATA:  Left facial droop and slurred speech.  EXAM: CT HEAD WITHOUT CONTRAST  TECHNIQUE: Contiguous axial images were obtained from the base of the skull through the vertex without intravenous contrast.  COMPARISON:  Head CT scan 10/15/2012.  FINDINGS: The brain appears normal without hemorrhage, infarct, mass lesion, mass effect, midline shift or abnormal extra-axial fluid collection. No hydrocephalus or pneumocephalus. The calvarium is intact imaged paranasal sinuses and mastoid air cells are clear.  IMPRESSION: Negative head CT. Findings called to Dr. Regenia Skeeter at the time of interpretation.   Electronically Signed   By: Inge Rise M.D.   On: 08/17/2014 17:16   Mr Brain Wo Contrast  08/17/2014   CLINICAL DATA:  Code stroke. Suspected conversion reaction. Possible facial droop and slurred speech.  EXAM: MRI HEAD WITHOUT CONTRAST  TECHNIQUE: Multiplanar, multiecho pulse sequences of the brain and surrounding structures were obtained without intravenous contrast.  COMPARISON:  CT head earlier in the day.  FINDINGS: Only the tibiae sequence was obtained.  This was normal.  IMPRESSION: Negative exam.  No evidence for acute stroke.  Critical Value/emergent results were discussed in person at the time of interpretation on 08/17/2014 at 6:03 pm to Dr. Jim Like , who verbally acknowledged these results.   Electronically Signed   By: Rolla Flatten  M.D.   On: 08/17/2014 18:03     PERTINENT LAB RESULTS: CBC:  Recent Labs  08/17/14 1723 08/17/14 1739  WBC 8.1  --   HGB 12.1 12.9  HCT 36.7 38.0  PLT 322  --    CMET CMP     Component Value Date/Time   NA 141 08/17/2014 1739   K 3.8 08/17/2014 1739   CL 106 08/17/2014 1739   CO2 22 08/17/2014 1723   GLUCOSE 91 08/17/2014 1739   BUN 8 08/17/2014 1739   CREATININE 0.60 08/17/2014 1739   CALCIUM 9.4 08/17/2014 1723   PROT 7.6 08/17/2014 1723   ALBUMIN 4.1 08/17/2014 1723   AST 17 08/17/2014 1723   ALT 18 08/17/2014 1723   ALKPHOS 56 08/17/2014 1723   BILITOT 0.8 08/17/2014 1723  GFRNONAA >90 08/17/2014 1723   GFRAA >90 08/17/2014 1723    GFR CrCl cannot be calculated (Unknown ideal weight.). No results for input(s): LIPASE, AMYLASE in the last 72 hours. No results for input(s): CKTOTAL, CKMB, CKMBINDEX, TROPONINI in the last 72 hours. Invalid input(s): POCBNP No results for input(s): DDIMER in the last 72 hours. No results for input(s): HGBA1C in the last 72 hours. No results for input(s): CHOL, HDL, LDLCALC, TRIG, CHOLHDL, LDLDIRECT in the last 72 hours. No results for input(s): TSH, T4TOTAL, T3FREE, THYROIDAB in the last 72 hours.  Invalid input(s): FREET3 No results for input(s): VITAMINB12, FOLATE, FERRITIN, TIBC, IRON, RETICCTPCT in the last 72 hours. Coags:  Recent Labs  08/17/14 1723  INR 1.06   Microbiology: No results found for this or any previous visit (from the past 240 hour(s)).   BRIEF HOSPITAL COURSE:   Principal Problem: Left sided weakness: Admitted and seen by neurology, MRI of the brain negative. Discussed case with Dr. Marcie Mowers suggests that this is conversion disorder and patient does not have acute CVA. Psychiatry consulted, have reassured patient that she has not had a CVA. This morning, patient was noted to have significantly more movement on her left side, she was actually observed by this M.D. walking in the hall.  She is being discharged home in a stable manner.  Active Problems:   Conversion disorder vs Malingering: Admitted with left-sided weakness, neurology felt that patient had conversion disorder, MRI of the brain is negative. Upon admission, patient asking for narcotics, being uncooperative with staff. Per RN, patient had on's medications with her in the room, and was not willing to have it stored at the pharmacy. I suspect that patient has narcotic seeking behavior, and may have in fact been malingering. She was seen walking the hallway today, she no longer has any sided weakness.    Conversion disorder: ?Malingering.Psychiatry consulted, reassurance/supportive care provided   Essential hypertension: Controlled continue with amlodipine, and lisinopril.   Chronic pain syndrome-questionable narcotic seeking behavior:claims she is on chronic narcotics at home suspect if I stop them she will withdraw. Explained to patient, that she will not be provided with any narcotics on discharge. I've asked her to follow-up with her regular doctor for further needs. She probably benefit from psych eval as an outpatient and also referral to pain management   Reported history of breast cancer: Claims patient follows with "Dr Ulanda Edison" at [facility], [place]-however is not able to provide me with further details. I tried searching the Internet to see if I could locate the provider-I was unsuccessful. Surprising that this patient does not have contact information for her oncologist-she claims she got chemotherapy last week!   TODAY-DAY OF DISCHARGE:  Subjective:   Adriyana Greenbaum today has no headache,no chest abdominal pain,no new weakness tingling or numbness, feels much better wants to go home today.   Objective:   Blood pressure 126/65, pulse 95, temperature 98.7 F (37.1 C), temperature source Oral, resp. rate 20, weight 97.841 kg (215 lb 11.2 oz), SpO2 98 %.  Intake/Output Summary (Last 24 hours) at  08/19/14 1023 Last data filed at 08/18/14 1444  Gross per 24 hour  Intake    240 ml  Output      0 ml  Net    240 ml   Filed Weights   08/18/14 0145 08/19/14 0348  Weight: 94.348 kg (208 lb) 97.841 kg (215 lb 11.2 oz)    Exam Awake Alert, Oriented *3, No new F.N deficits, Normal affect  [place].AT,PERRAL Supple Neck,No JVD, No cervical lymphadenopathy appriciated.  Symmetrical Chest wall movement, Good air movement bilaterally, CTAB RRR,No Gallops,Rubs or new Murmurs, No Parasternal Heave +ve B.Sounds, Abd Soft, Non tender, No organomegaly appriciated, No rebound -guarding or rigidity. No Cyanosis, Clubbing or edema, No new Rash or bruise  DISCHARGE CONDITION: Stable  DISPOSITION: Home  DISCHARGE INSTRUCTIONS:    Activity:  As tolerated   Diet recommendation: Heart Healthy diet  Discharge Instructions    Call MD for:  extreme fatigue    Complete by:  As directed      Call MD for:  persistant dizziness or light-headedness    Complete by:  As directed      Diet - low sodium heart healthy    Complete by:  As directed      Increase activity slowly    Complete by:  As directed            Follow-up Information    Follow up with Hubbard Robinson, MD. Schedule an appointment as soon as possible for a visit in 1 week.   Specialty:  Internal Medicine     Total Time spent on discharge equals 45 minutes.  SignedOren Binet 08/19/2014 10:23 AM

## 2014-08-19 NOTE — Progress Notes (Signed)
Pt asked me for her scheduled morphine and prn xanax. I informed pt that we could not give her those medications because she had told a previous RN that she was taking her home medications. Pt stated that she had not taking any home medications. I educated pt on the risks of taking her home medications along with the hospital medication and that it was against [facility] policy for pts to do so. MD stated not to give pt any morphine or xanax. Pt has discharge orders in. Will continue to monitor.

## 2014-08-19 NOTE — Progress Notes (Signed)
I brought pt her discharge papers and educated her on her discharge instructions. Pt verbalized understanding. Pt given discharge packet. IV team deaccessed pt's port-a-cath. Pt informed me that her sister would be able to transport her back to pt's home. Staff requested that pt wait in discharge lounge. Pt refused to wait in discharge lounge but stated that she would sit in the unit's lounge and wait for her lunch to come. When I brought pt her lunch, pt had left the unit. I went to see if she was in the downstairs lobby and saw her getting into a car.

## 2014-08-26 ENCOUNTER — Ambulatory Visit: Admit: 2014-08-26 | Payer: MEDICARE | Attending: Specialist

## 2014-08-26 DIAGNOSIS — M5137 Other intervertebral disc degeneration, lumbosacral region: Secondary | ICD-10-CM

## 2014-08-26 NOTE — Progress Notes (Signed)
Patient: Shari Cobb                MRN: [id number]       SSN: [id number]  Date of Birth: April 01, 1966        AGE: 49 y.o.        SEX: female      PCP: No primary care provider on file.  08/26/2014    Chief Complaint   Patient presents with   ??? Back Pain   ??? Hip Pain     right       HISTORY:  Shari Cobb is a 49 y.o. female who is seen for back and right hip pain.  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  She was moving a patient when when she injured her back.  She woke up about three months ago and was unable to walk due to left hip and leg pain.  She saw Dr. Manson Passey in [place], [place].  She is no longer being covered by worker's compensation for the injury.  She uses a rollator walker.      Occupation, etc: Shari Cobb is currently not working.  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  She receives Social Security Disability Benefits for her back and hip pain.  She is in the process of moving from [place], [place] to [place].  She lives alone and has a home health aide daily.    Last 3 Recorded Weights in this Encounter    08/26/14 1320   Weight: 210 lb (95.255 kg)     Body mass index is 36.03 kg/(m^2).    There is no problem list on file for this patient.      REVIEW OF SYSTEMS: All Below are Negative except: See HPI     Constitutional: negative for fever, chills, and weight loss.   Cardiovascular: negative for chest pain, claudication, leg swelling, SOB, DOE   Gastrointestinal: Negative for pain, N/V/C/D, Blood in stool or urine, dysuria,  hematuria, incontinence, pelvic pain.   Musculoskeletal: See HPI   Neurological: Negative for dizziness and weakness.   Negative for headaches, Visual changes, confusion, seizures   Phychiatric/Behavioral: Negative for depression, memory loss, substance  abuse.    Extremities: Negative for hair changes, rash, or skin lesion changes.   Hematologic: Negative for bleeding problems, bruising, pallor or swollen  lymph  nodes   Peripheral Vascular: No calf pain, no circulation deficits.    History     Social History   ??? Marital Status: UNKNOWN     Spouse Name: N/A   ??? Number of Children: N/A   ??? Years of Education: N/A     Occupational History   ??? Not on file.     Social History Main Topics   ??? Smoking status: Light Tobacco Smoker   ??? Smokeless tobacco: Never Used   ??? Alcohol Use: Not on file   ??? Drug Use: Not on file   ??? Sexual Activity: Not on file     Other Topics Concern   ??? Not on file     Social History Narrative   ??? No narrative on file        Not on File     Current Outpatient Prescriptions   Medication Sig   ??? zolpidem (AMBIEN) 10 mg tablet    ??? gabapentin (NEURONTIN) 600 mg tablet    ??? tiZANidine (ZANAFLEX) 4 mg tablet    ???  OLANZapine (ZYPREXA) 20 mg tablet    ??? ranitidine (ZANTAC) 150 mg tablet    ??? venlafaxine-SR (EFFEXOR-XR) 75 mg capsule    ??? ALPRAZolam (XANAX) 1 mg tablet    ??? venlafaxine-SR (EFFEXOR-XR) 150 mg capsule    ??? lisinopril (PRINIVIL, ZESTRIL) 20 mg tablet    ??? mirtazapine (REMERON) 45 mg tablet    ??? folic acid 400 mcg tablet Take 400 mcg by mouth daily.     No current facility-administered medications for this visit.        PHYSICAL EXAMINATION:  BP 123/81 mmHg   Pulse 95   Temp(Src) 98 ??F (36.7 ??C)   Ht 5\' 4"  (1.626 m)   Wt 210 lb (95.255 kg)   BMI 36.03 kg/m2     ORTHO EXAMINATION:  Examination Lumbar   Skin Intact   Tenderness +, lumbosacral    Tightness +, lumbosacral    Lordosis Normal   Kyphosis N/A   Scoliosis -   Flexion Fingertips to knees   Extension Minimal    Knee reflexes Absent    Ankle reflexes Absent    Straight leg raise -   Calf tenderness -       RADIOGRAPHS:  MRI LUMBAR WO CONT 08/14/10  Impression:  Mild L5-S1 degenerative disc space narrowing with no focal herniation.  No compromise of the central canal or neural foramen.     IMPRESSION:      ICD-10-CM ICD-9-CM    1. DDD (degenerative disc disease), lumbosacral M51.37 722.52 REFERRAL TO SPINE SURGERY       REFERRAL TO PHYSICAL THERAPY   2. Lumbar radiculopathy M54.16 724.4 REFERRAL TO SPINE SURGERY      REFERRAL TO PHYSICAL THERAPY       PLAN:  She will follow up with the spine center for her back pain.  She will start a brief course of outpatient physical therapy.  I will see her back as needed.    Scribed by Beverly SessionsAllison Shaffer (Scribekick) as dictated by Tonia BroomsSteven C. Beyza Bellino, MD

## 2014-08-26 NOTE — Progress Notes (Signed)
Chief Complaint   Patient presents with   ??? Back Pain   ??? 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

## 2014-08-26 NOTE — Patient Instructions (Signed)
Back Pain: Care Instructions  Your Care Instructions     Back pain has many possible causes. It is often related to problems with muscles and ligaments of the back. It may also be related to problems with the nerves, discs, or bones of the back. Moving, lifting, standing, sitting, or sleeping in an awkward way can strain the back. Sometimes you don't notice the injury until later. Arthritis is another common cause of back pain.  Although it may hurt a lot, back pain usually improves on its own within several weeks. Most people recover in 12 weeks or less. Using good home treatment and being careful not to stress your back can help you feel better sooner.  Follow-up care is a key part of your treatment and safety. Be sure to make and go to all appointments, and call your doctor if you are having problems. It???s also a good idea to know your test results and keep a list of the medicines you take.  How can you care for yourself at home?  ?? Sit or lie in positions that are most comfortable and reduce your pain. Try one of these positions when you lie down:  ?? Lie on your back with your knees bent and supported by large pillows.  ?? Lie on the floor with your legs on the seat of a sofa or chair.  ?? Lie on your side with your knees and hips bent and a pillow between your legs.  ?? Lie on your stomach if it does not make pain worse.  ?? Do not sit up in bed, and avoid soft couches and twisted positions. Bed rest can help relieve pain at first, but it delays healing. Avoid bed rest after the first day of back pain.  ?? Change positions every 30 minutes. If you must sit for long periods of time, take breaks from sitting. Get up and walk around, or lie in a comfortable position.  ?? Try using a heating pad on a low or medium setting for 15 to 20 minutes every 2 or 3 hours. Try a warm shower in place of one session with the heating pad.  ?? You can also try an ice pack for 10 to 15 minutes every 2 to 3 hours.  Put a thin cloth between the ice pack and your skin.  ?? Take pain medicines exactly as directed.  ?? If the doctor gave you a prescription medicine for pain, take it as prescribed.  ?? If you are not taking a prescription pain medicine, ask your doctor if you can take an over-the-counter medicine.  ?? Take short walks several times a day. You can start with 5 to 10 minutes, 3 or 4 times a day, and work up to longer walks. Walk on level surfaces and avoid hills and stairs until your back is better.  ?? Return to work and other activities as soon as you can. Continued rest without activity is usually not good for your back.  ?? To prevent future back pain, do exercises to stretch and strengthen your back and stomach. Learn how to use good posture, safe lifting techniques, and proper body mechanics.  When should you call for help?  Call your doctor now or seek immediate medical care if:  ?? You have new or worsening numbness in your legs.  ?? You have new or worsening weakness in your legs. (This could make it hard to stand up.)  ?? You lose control of your bladder or bowels.    Watch closely for changes in your health, and be sure to contact your doctor if:  ?? Your pain gets worse.  ?? You are not getting better after 2 weeks.   Where can you learn more?   Go to http://www.healthwise.net/[facility]  Enter I594 in the search box to learn more about "Back Pain: Care Instructions."   ?? 2006-2016 Healthwise, Inc[employer]. Care instructions adapted under license by [facility] (which disclaims liability or warranty for this information). This care instruction is for use with your licensed healthcare professional. If you have questions about a medical condition or this instruction, always ask your healthcare professional. Healthwise, Incorporated disclaims any warranty or liability for your use of this information.  Content Version: 10.8.513193; Current as of: Sep 18, 2013

## 2014-09-21 ENCOUNTER — Other Ambulatory Visit: Payer: Self-pay | Admitting: Specialist

## 2014-09-21 DIAGNOSIS — G8929 Other chronic pain: Secondary | ICD-10-CM

## 2014-09-21 DIAGNOSIS — M545 Low back pain, unspecified: Secondary | ICD-10-CM

## 2014-09-21 DIAGNOSIS — M5136 Other intervertebral disc degeneration, lumbar region: Secondary | ICD-10-CM

## 2014-09-24 ENCOUNTER — Ambulatory Visit
Admission: RE | Admit: 2014-09-24 | Discharge: 2014-09-24 | Disposition: A | Discharge status: Home / Self Care | Payer: Medicare Other | Source: Ambulatory Visit | Attending: Specialist | Admitting: Specialist

## 2014-09-24 DIAGNOSIS — F17200 Nicotine dependence, unspecified, uncomplicated: Secondary | ICD-10-CM | POA: Insufficient documentation

## 2014-09-24 DIAGNOSIS — F329 Major depressive disorder, single episode, unspecified: Secondary | ICD-10-CM | POA: Insufficient documentation

## 2014-09-24 DIAGNOSIS — M5136 Other intervertebral disc degeneration, lumbar region: Secondary | ICD-10-CM

## 2014-09-24 DIAGNOSIS — M545 Low back pain, unspecified: Secondary | ICD-10-CM

## 2014-09-24 DIAGNOSIS — M51369 Other intervertebral disc degeneration, lumbar region without mention of lumbar back pain or lower extremity pain: Secondary | ICD-10-CM

## 2014-09-24 DIAGNOSIS — G8929 Other chronic pain: Secondary | ICD-10-CM

## 2014-09-24 DIAGNOSIS — F32A Depression, unspecified: Secondary | ICD-10-CM | POA: Insufficient documentation

## 2014-09-24 DIAGNOSIS — K859 Acute pancreatitis without necrosis or infection, unspecified: Secondary | ICD-10-CM | POA: Insufficient documentation

## 2014-09-24 MED ORDER — DIAZEPAM 5 MG PO TABS
10.0000 mg | ORAL_TABLET | Freq: Once | ORAL | Status: AC
  Administered 2014-09-24: 10 mg via ORAL

## 2014-09-24 MED ORDER — IOHEXOL 180 MG/ML  SOLN
15.0000 mL | Freq: Once | INTRAMUSCULAR | Status: AC | PRN
  Administered 2014-09-24: 15 mL via INTRATHECAL

## 2014-09-24 NOTE — Progress Notes (Addendum)
Patient states she has been off Effexor, Mirtazipine, Phenergan and Zyprexa for at least the past two days.  Patient also states she took there 13-hour prep as prescribed, including the Benadryl.  Brita Romp, RN

## 2014-09-24 NOTE — Discharge Instructions (Addendum)
[facility] Discharge Instructions  1. Go home and rest quietly for the next 24 hours.  It is important to lie flat for the next 24 hours.  Get up only to go to the restroom.  You may lie in the bed or on a couch on your back, your stomach, your left side or your right side.  You may have one pillow under your head.  You may have pillows between your knees while you are on your side or under your knees while you are on your back.  2. DO NOT drive today.  Recline the seat as far back as it will go, while still wearing your seat belt, on the way home.  3. You may get up to go to the bathroom as needed.  You may sit up for 10 minutes to eat.  You may resume your normal diet and medications unless otherwise indicated.  Drink plenty of extra fluids today and tomorrow.  4. The incidence of a spinal headache with nausea and/or vomiting is about 5% (one in 20 patients).  If you develop a headache, lie flat and drink plenty of fluids until the headache goes away.  Caffeinated beverages may be helpful.  If you develop severe nausea and vomiting or a headache that does not go away with flat bed rest, call [phone].  5. You may resume normal activities after your 24 hours of bed rest is over; however, do not exert yourself strongly or do any heavy lifting tomorrow.  6. Call your physician for a follow-up appointment.   You may resume Effexor, Mirtazipine, Phenergan and Zyprexa on Saturday, Sep 25, 2014 after 1:30p.m.

## 2014-09-30 ENCOUNTER — Encounter: Attending: Physical Medicine & Rehabilitation

## 2014-10-12 ENCOUNTER — Other Ambulatory Visit (HOSPITAL_COMMUNITY): Payer: Self-pay | Admitting: Specialist

## 2014-10-19 NOTE — Pre-Procedure Instructions (Signed)
KAYTLIN BURKLOW  10/19/2014      [id number] - [place], [address] AT [address] [place] [address] Phone: [phone] Fax: [phone]  [id number] - [place], [address] [address] Phone: [phone] Fax: [phone]    Your procedure is scheduled on  Tuesday 10/26/14  Report to [facility] Admitting at 530 A.M.  Call this number if you have problems the morning of surgery:  [phone]   Remember:  Do not eat food or drink liquids after midnight.  Take these medicines the morning of surgery with A SIP OF WATER   ALBUTEROL, ALPRAZOLAM IF NEEDED, AMLODIPINE (NORVASC), OXYCODONE IF NEEDED, POTASSIUM, RANITIDINE, VENLAFAXINE       Do not wear jewelry, make-up or nail polish.  Do not wear lotions, powders, or perfumes.  You may NOT wear deodorant the day of surgery.  Do not shave underarms & legs 48 hours prior to surgery.     Do not bring valuables to the hospital.  [facility] is not responsible for any belongings or valuables.  Contacts, dentures or bridgework may not be worn into surgery.  Leave your suitcase in the car.  After surgery it may be brought to your room. For patients admitted to the hospital, discharge time will be determined by your treatment team.   Name and phone number of your driver:      Special instructions:  [facility] - Preparing for Surgery  Before surgery, you can play an important role.  Because skin is not sterile, your skin needs to be as free of germs as possible.  You can reduce the number of germs on you skin by washing with CHG (chlorahexidine gluconate) soap before surgery.  CHG is an antiseptic cleaner which kills germs and bonds with the skin to continue killing germs even after washing.  Please DO NOT use if you have an allergy to CHG or antibacterial soaps.  If your  skin becomes reddened/irritated stop using the CHG and inform your nurse when you arrive at Short Stay.  Do not shave (including legs and underarms) for at least 48 hours prior to the first CHG shower.  You may shave your face.  Please follow these instructions carefully:   1.  Shower with CHG Soap the night before surgery and the morning of Surgery.  2.  If you choose to wash your hair, wash your hair first as usual with your normal shampoo.  3.  After you shampoo, rinse your hair and body thoroughly to remove the Shampoo.  4.  Use CHG as you would any other liquid soap.  You can apply chg directly to the skin and wash gently with scrungie or a clean washcloth.  5.  Apply the CHG Soap to your body ONLY FROM THE NECK DOWN.        Do not use on open wounds or open sores.  Avoid contact with your eyes ears, mouth and genitals (private parts).  Wash genitals (private parts) with your normal soap.  6.  Wash thoroughly, paying special attention to the area where your surgery will be performed.  7.  Thoroughly rinse your body with warm water from the neck down.  8.  DO NOT shower/wash with your normal soap after using and rinsing off the CHG Soap.  9.  Pat yourself dry  with a clean towel.            10.  Wear clean pajamas.            11.  Place clean sheets on your bed the night of your first shower and do not sleep with pets.  Day of Surgery  Do not apply any lotions/deoderants the morning of surgery.  Please wear clean clothes to the hospital/surgery center.    Please read over the following fact sheets that you were given. Pain Booklet, Coughing and Deep Breathing, Blood Transfusion Information, Total Joint Packet, MRSA Information and Surgical Site Infection Prevention

## 2014-10-20 ENCOUNTER — Encounter: Attending: Physical Medicine & Rehabilitation

## 2014-10-20 ENCOUNTER — Encounter (HOSPITAL_COMMUNITY)
Admission: RE | Admit: 2014-10-20 | Discharge: 2014-10-20 | Disposition: A | Discharge status: Home / Self Care | Payer: Medicare Other | Source: Ambulatory Visit | Attending: Specialist | Admitting: Specialist

## 2014-10-20 ENCOUNTER — Other Ambulatory Visit (HOSPITAL_COMMUNITY): Payer: Medicare Other

## 2014-10-20 ENCOUNTER — Encounter (HOSPITAL_COMMUNITY): Payer: Self-pay

## 2014-10-20 DIAGNOSIS — Z853 Personal history of malignant neoplasm of breast: Secondary | ICD-10-CM | POA: Diagnosis not present

## 2014-10-20 DIAGNOSIS — Z7902 Long term (current) use of antithrombotics/antiplatelets: Secondary | ICD-10-CM | POA: Insufficient documentation

## 2014-10-20 DIAGNOSIS — M4317 Spondylolisthesis, lumbosacral region: Secondary | ICD-10-CM | POA: Diagnosis not present

## 2014-10-20 DIAGNOSIS — Z87891 Personal history of nicotine dependence: Secondary | ICD-10-CM | POA: Insufficient documentation

## 2014-10-20 DIAGNOSIS — Z01818 Encounter for other preprocedural examination: Secondary | ICD-10-CM | POA: Insufficient documentation

## 2014-10-20 DIAGNOSIS — I10 Essential (primary) hypertension: Secondary | ICD-10-CM | POA: Insufficient documentation

## 2014-10-20 DIAGNOSIS — Z0183 Encounter for blood typing: Secondary | ICD-10-CM | POA: Insufficient documentation

## 2014-10-20 DIAGNOSIS — I2510 Atherosclerotic heart disease of native coronary artery without angina pectoris: Secondary | ICD-10-CM | POA: Diagnosis not present

## 2014-10-20 DIAGNOSIS — Z8673 Personal history of transient ischemic attack (TIA), and cerebral infarction without residual deficits: Secondary | ICD-10-CM | POA: Diagnosis not present

## 2014-10-20 DIAGNOSIS — Z79899 Other long term (current) drug therapy: Secondary | ICD-10-CM | POA: Diagnosis not present

## 2014-10-20 DIAGNOSIS — Z7982 Long term (current) use of aspirin: Secondary | ICD-10-CM | POA: Insufficient documentation

## 2014-10-20 DIAGNOSIS — Z01812 Encounter for preprocedural laboratory examination: Secondary | ICD-10-CM | POA: Insufficient documentation

## 2014-10-20 DIAGNOSIS — G4733 Obstructive sleep apnea (adult) (pediatric): Secondary | ICD-10-CM | POA: Diagnosis not present

## 2014-10-20 HISTORY — DX: Cerebral infarction, unspecified: I63.9

## 2014-10-20 HISTORY — DX: Personal history of other diseases of the digestive system: Z87.19

## 2014-10-20 HISTORY — DX: Sleep apnea, unspecified: G47.30

## 2014-10-20 HISTORY — DX: Sickle-cell disease without crisis: D57.1

## 2014-10-20 HISTORY — DX: Reserved for inherently not codable concepts without codable children: IMO0001

## 2014-10-20 LAB — ABO/RH: ABO/RH(D): B POS

## 2014-10-20 LAB — COMPREHENSIVE METABOLIC PANEL
ALK PHOS: 58 U/L (ref 38–126)
ALT: 18 U/L (ref 14–54)
AST: 20 U/L (ref 15–41)
Albumin: 3.7 g/dL (ref 3.5–5.0)
Anion gap: 10 (ref 5–15)
BUN: 8 mg/dL (ref 6–20)
CO2: 23 mmol/L (ref 22–32)
CREATININE: 0.8 mg/dL (ref 0.44–1.00)
Calcium: 9.1 mg/dL (ref 8.9–10.3)
Chloride: 107 mmol/L (ref 101–111)
GFR calc Af Amer: >60 mL/min (ref >60)
GLUCOSE: 110 mg/dL — AB (ref 65–99)
Potassium: 3.5 mmol/L (ref 3.5–5.1)
Sodium: 140 mmol/L (ref 135–145)
TOTAL PROTEIN: 6.9 g/dL (ref 6.5–8.1)
Total Bilirubin: 0.8 mg/dL (ref 0.3–1.2)

## 2014-10-20 LAB — TYPE AND SCREEN
ABO/RH(D): B POS
ANTIBODY SCREEN: NEGATIVE

## 2014-10-20 LAB — CBC
HCT: 34.7 % — ABNORMAL LOW (ref 36.0–46.0)
Hemoglobin: 11.5 g/dL — ABNORMAL LOW (ref 12.0–15.0)
MCH: 23.2 pg — AB (ref 26.0–34.0)
MCHC: 33.1 g/dL (ref 30.0–36.0)
MCV: 70 fL — ABNORMAL LOW (ref 78.0–100.0)
PLATELETS: 321 10*3/uL (ref 150–400)
RBC: 4.96 MIL/uL (ref 3.87–5.11)
RDW: 15.4 % (ref 11.5–15.5)
WBC: 7.1 10*3/uL (ref 4.0–10.5)

## 2014-10-20 LAB — SURGICAL PCR SCREEN
MRSA, PCR: NEGATIVE
Staphylococcus aureus: NEGATIVE

## 2014-10-20 NOTE — Progress Notes (Addendum)
Anesthesia Chart Review:  Pt is 49 year old female scheduled for lumbar fusion L5-S1 on 10/26/2014 with Dr. Louanne Skye.   PMH includes: somatization disorder (see 08/19/14 Epic psychiatry consult note for details), CVA, HTN, CAD, pancreatitis, OSA, breast cancer, sickle cell. Former smoker. BMI 37.   Medications include: amlodipine, ASA, plavix, lasix, hydroxyurea, lisinopril, remeron, zyprexa, potassium, zocor. Pt stopped plavix 10/15/14 for surgery.   Pt hospitalized 04/2014 and 06/2014 (see care everywhere) for chest pain. Pt was advised to have stress test but refused and left AMA.    Preoperative labs reviewed.    EKG 08/17/2014: Sinus rhythm. Baseline wander in lead(s) II III aVR aVF V1 V2.  CXR report 07/22/2014 reviewed (care everywhere): As compared study of January 18, infusion catheter and postsurgical changes in the right breast are stable. Heart and vascular structures are stable. Lungs are underinflated without acute appearing infiltrates or masses. No definite acute infiltrates or masses are identified. Minimal basilar atelectatic type change is difficult to exclude on these images however.  Nuclear stress test 05/20/2014 (care everywhere): -Rest only nuclear stress test showing normal perfusion in the resting exam. Stress not completed due to pt refusal.   Echo 10/16/2012:  - Left ventricle: The cavity size was normal. Systolic function was normal. The estimated ejection fraction was in the range of 60% to 65%. Wall motion was normal; there were no regional wall motion abnormalities. - Pericardium, extracardiac: A trivial pericardial effusion was identified.  Carotid duplex US 10/16/2012:  - The vertebral arteries appear patent with antegrade flow. - Findings consistent with less than 39 percent stenosis involving the right internal carotid artery and the left internal carotid artery.  Reviewed case with Dr. Lissa Hoard. Pt will need cardiac clearance with stress testing prior to surgery.  Left message for Sherrie in Dr. Otho Ket office.   Willeen Cass, FNP-BC [facility] Short Stay Surgical Center/Anesthesiology Phone: [phone] 10/21/2014 11:45 AM  Addendum: Dr. Otho Ket office did obtain cardiac clearance for patient. Dr. Claudie Fisherman at [facility] in [place], [place] cleared pt for surgery based on reassuring EKG, absence of CV symptoms and normal stress test from 2013.    Reviewed Dr. Theodosia Blender note and pt history as above with Dr. Orene Desanctis.   If no changes, I anticipate pt can proceed with surgery as scheduled.   Willeen Cass, FNP-BC [facility] Short Stay Surgical Center/Anesthesiology Phone: [phone] 10/25/2014 3:07 PM

## 2014-10-20 NOTE — Progress Notes (Addendum)
PCP  Is Dr. Reita May  Cardio is Dr. Claudie Fisherman, who she just saw last week. Medical Clearance inside chart. [phone]   She states she had cath in 2002.  Stress Cardiolite in Jan 2016. And has been on plavix for the strokes.   She has stopped the plavix as of June 17th--Friday. Notes are in "care everywhere' for recent CXR & EKG.    She is very hard 'stick'.....she now tells me that she has had chemo and last round was Jan. 2016.  She also states that her breast cancer has gone to her hip?  Unable to find any documentation to this and sickle cell.

## 2014-10-20 NOTE — Progress Notes (Signed)
   10/20/14 0923  OBSTRUCTIVE SLEEP APNEA  Have you ever been diagnosed with sleep apnea through a sleep study? Yes (was tested in [place]...over 5 yrs ago. didn't have insurance then to pay for the equipment.  doesn't wear anything now)  If yes, do you have and use a CPAP or BPAP machine every night? 0 (supposedly her PCP Dr. Reita May in [place] may be aware)  Do you snore loudly (loud enough to be heard through closed doors)?  1  Do you often feel tired, fatigued, or sleepy during the daytime? 1 (really doesn't sleep well at night)  Has anyone observed you stop breathing during your sleep? 1 (family has seen her stop breathing)  Do you have, or are you being treated for high blood pressure? 1  BMI more than 35 kg/m2? 1  Age over 3 years old? 0  Neck circumference greater than 40 cm/16 inches? 0  Gender: 0

## 2014-10-25 MED ORDER — VANCOMYCIN HCL IN DEXTROSE 1-5 GM/200ML-% IV SOLN
1000.0000 mg | INTRAVENOUS | Status: AC
  Administered 2014-10-26: 1000 mg via INTRAVENOUS

## 2014-10-26 ENCOUNTER — Inpatient Hospital Stay (HOSPITAL_COMMUNITY): Payer: Medicare Other

## 2014-10-26 ENCOUNTER — Inpatient Hospital Stay (HOSPITAL_COMMUNITY)
Admission: RE | Admit: 2014-10-26 | Discharge: 2014-10-29 | DRG: 460 | Disposition: A | Discharge status: Home Health | Payer: Medicare Other | Source: Ambulatory Visit | Attending: Specialist | Admitting: Specialist

## 2014-10-26 ENCOUNTER — Encounter (HOSPITAL_COMMUNITY)
Admission: RE | Disposition: A | Discharge status: Home Health | Payer: Medicare Other | Source: Ambulatory Visit | Attending: Specialist

## 2014-10-26 ENCOUNTER — Encounter (HOSPITAL_COMMUNITY): Payer: Self-pay | Admitting: General Practice

## 2014-10-26 ENCOUNTER — Inpatient Hospital Stay (HOSPITAL_COMMUNITY): Payer: Medicare Other | Admitting: Anesthesiology

## 2014-10-26 ENCOUNTER — Inpatient Hospital Stay (HOSPITAL_COMMUNITY): Payer: Medicare Other | Admitting: Emergency Medicine

## 2014-10-26 ENCOUNTER — Encounter: Payer: Medicare Other | Admitting: Cardiovascular Disease

## 2014-10-26 DIAGNOSIS — Z880 Allergy status to penicillin: Secondary | ICD-10-CM

## 2014-10-26 DIAGNOSIS — Z8249 Family history of ischemic heart disease and other diseases of the circulatory system: Secondary | ICD-10-CM

## 2014-10-26 DIAGNOSIS — Z885 Allergy status to narcotic agent status: Secondary | ICD-10-CM | POA: Diagnosis not present

## 2014-10-26 DIAGNOSIS — G4730 Sleep apnea, unspecified: Secondary | ICD-10-CM | POA: Diagnosis present

## 2014-10-26 DIAGNOSIS — F329 Major depressive disorder, single episode, unspecified: Secondary | ICD-10-CM | POA: Diagnosis present

## 2014-10-26 DIAGNOSIS — D62 Acute posthemorrhagic anemia: Secondary | ICD-10-CM | POA: Diagnosis not present

## 2014-10-26 DIAGNOSIS — M4317 Spondylolisthesis, lumbosacral region: Principal | ICD-10-CM | POA: Diagnosis present

## 2014-10-26 DIAGNOSIS — Z79891 Long term (current) use of opiate analgesic: Secondary | ICD-10-CM

## 2014-10-26 DIAGNOSIS — M545 Low back pain: Secondary | ICD-10-CM | POA: Diagnosis present

## 2014-10-26 DIAGNOSIS — D571 Sickle-cell disease without crisis: Secondary | ICD-10-CM | POA: Diagnosis present

## 2014-10-26 DIAGNOSIS — D563 Thalassemia minor: Secondary | ICD-10-CM | POA: Diagnosis present

## 2014-10-26 DIAGNOSIS — F17210 Nicotine dependence, cigarettes, uncomplicated: Secondary | ICD-10-CM | POA: Diagnosis present

## 2014-10-26 DIAGNOSIS — Z7982 Long term (current) use of aspirin: Secondary | ICD-10-CM | POA: Diagnosis not present

## 2014-10-26 DIAGNOSIS — Z8673 Personal history of transient ischemic attack (TIA), and cerebral infarction without residual deficits: Secondary | ICD-10-CM

## 2014-10-26 DIAGNOSIS — Z419 Encounter for procedure for purposes other than remedying health state, unspecified: Secondary | ICD-10-CM

## 2014-10-26 DIAGNOSIS — Z853 Personal history of malignant neoplasm of breast: Secondary | ICD-10-CM

## 2014-10-26 DIAGNOSIS — F450 Somatization disorder: Secondary | ICD-10-CM | POA: Diagnosis present

## 2014-10-26 DIAGNOSIS — I10 Essential (primary) hypertension: Secondary | ICD-10-CM | POA: Diagnosis present

## 2014-10-26 DIAGNOSIS — Z7902 Long term (current) use of antithrombotics/antiplatelets: Secondary | ICD-10-CM | POA: Diagnosis not present

## 2014-10-26 DIAGNOSIS — I2510 Atherosclerotic heart disease of native coronary artery without angina pectoris: Secondary | ICD-10-CM | POA: Diagnosis present

## 2014-10-26 DIAGNOSIS — Z79899 Other long term (current) drug therapy: Secondary | ICD-10-CM

## 2014-10-26 DIAGNOSIS — Z91040 Latex allergy status: Secondary | ICD-10-CM

## 2014-10-26 DIAGNOSIS — Z888 Allergy status to other drugs, medicaments and biological substances status: Secondary | ICD-10-CM | POA: Diagnosis not present

## 2014-10-26 DIAGNOSIS — Z09 Encounter for follow-up examination after completed treatment for conditions other than malignant neoplasm: Secondary | ICD-10-CM

## 2014-10-26 DIAGNOSIS — M4316 Spondylolisthesis, lumbar region: Secondary | ICD-10-CM | POA: Diagnosis present

## 2014-10-26 DIAGNOSIS — J939 Pneumothorax, unspecified: Secondary | ICD-10-CM

## 2014-10-26 DIAGNOSIS — F419 Anxiety disorder, unspecified: Secondary | ICD-10-CM | POA: Diagnosis present

## 2014-10-26 HISTORY — PX: LUMBAR FUSION: SHX111

## 2014-10-26 LAB — CBC
HEMATOCRIT: 33.9 % — AB (ref 36.0–46.0)
Hemoglobin: 11.3 g/dL — ABNORMAL LOW (ref 12.0–15.0)
MCH: 23.7 pg — ABNORMAL LOW (ref 26.0–34.0)
MCHC: 33.3 g/dL (ref 30.0–36.0)
MCV: 71.1 fL — AB (ref 78.0–100.0)
Platelets: 286 10*3/uL (ref 150–400)
RBC: 4.77 MIL/uL (ref 3.87–5.11)
RDW: 15.8 % — ABNORMAL HIGH (ref 11.5–15.5)
WBC: 10.3 10*3/uL (ref 4.0–10.5)

## 2014-10-26 SURGERY — FUSION, SPINE, LUMBAR, MINIMALLY INVASIVE
Anesthesia: General | Site: Back

## 2014-10-26 MED ORDER — METHOCARBAMOL 500 MG PO TABS
500.0000 mg | ORAL_TABLET | Freq: Four times a day (QID) | ORAL | Status: DC | PRN
  Filled 2014-10-26 (×3): qty 1

## 2014-10-26 MED ORDER — FAMOTIDINE 20 MG PO TABS
20.0000 mg | ORAL_TABLET | Freq: Every day | ORAL | Status: DC
  Administered 2014-10-26 – 2014-10-28 (×3): 20 mg via ORAL
  Filled 2014-10-26 (×3): qty 1

## 2014-10-26 MED ORDER — ACETAMINOPHEN 325 MG PO TABS: 650.0000 mg | ORAL_TABLET | ORAL | Status: DC | PRN

## 2014-10-26 MED ORDER — PROMETHAZINE HCL 25 MG/ML IJ SOLN: 6.2500 mg | INTRAMUSCULAR | Status: DC | PRN

## 2014-10-26 MED ORDER — SODIUM CHLORIDE 0.9 % IJ SOLN
3.0000 mL | Freq: Two times a day (BID) | INTRAMUSCULAR | Status: DC
  Administered 2014-10-27 – 2014-10-28 (×3): 3 mL via INTRAVENOUS

## 2014-10-26 MED ORDER — ZOLPIDEM TARTRATE 5 MG PO TABS
5.0000 mg | ORAL_TABLET | Freq: Every day | ORAL | Status: DC
  Administered 2014-10-26 – 2014-10-28 (×3): 5 mg via ORAL
  Filled 2014-10-26 (×3): qty 1

## 2014-10-26 MED ORDER — GLYCOPYRROLATE 0.2 MG/ML IJ SOLN
INTRAMUSCULAR | Status: DC | PRN
Start: 2014-10-26 — End: 2014-10-26
  Administered 2014-10-26: .8 mg via INTRAVENOUS

## 2014-10-26 MED ORDER — METHOCARBAMOL 1000 MG/10ML IJ SOLN
500.0000 mg | Freq: Four times a day (QID) | INTRAVENOUS | Status: DC | PRN
  Administered 2014-10-26: 500 mg via INTRAVENOUS
  Filled 2014-10-26 (×3): qty 5

## 2014-10-26 MED ORDER — OXYCODONE-ACETAMINOPHEN 5-325 MG PO TABS
1.0000 | ORAL_TABLET | ORAL | Status: DC | PRN
  Administered 2014-10-26 (×2): 2 via ORAL
  Administered 2014-10-27 – 2014-10-29 (×7): 1 via ORAL
  Filled 2014-10-26 (×3): qty 1
  Filled 2014-10-26 (×5): qty 2
  Filled 2014-10-26 (×3): qty 1

## 2014-10-26 MED ORDER — PROPOFOL 10 MG/ML IV BOLUS
INTRAVENOUS | Status: DC | PRN
  Administered 2014-10-26: 100 mg via INTRAVENOUS

## 2014-10-26 MED ORDER — SUFENTANIL CITRATE 50 MCG/ML IV SOLN
INTRAVENOUS | Status: AC
  Filled 2014-10-26: qty 1

## 2014-10-26 MED ORDER — NEOSTIGMINE METHYLSULFATE 10 MG/10ML IV SOLN
INTRAVENOUS | Status: DC | PRN
  Administered 2014-10-26: 5 mg via INTRAVENOUS

## 2014-10-26 MED ORDER — LISINOPRIL 20 MG PO TABS
20.0000 mg | ORAL_TABLET | Freq: Every day | ORAL | Status: DC
  Administered 2014-10-27 – 2014-10-28 (×2): 20 mg via ORAL
  Filled 2014-10-26 (×4): qty 1

## 2014-10-26 MED ORDER — DEXAMETHASONE SODIUM PHOSPHATE 10 MG/ML IJ SOLN
INTRAMUSCULAR | Status: DC | PRN
  Administered 2014-10-26: 10 mg via INTRAVENOUS

## 2014-10-26 MED ORDER — VECURONIUM BROMIDE 10 MG IV SOLR
INTRAVENOUS | Status: DC | PRN
  Administered 2014-10-26: 3 mg via INTRAVENOUS
  Administered 2014-10-26 (×5): 1 mg via INTRAVENOUS
  Administered 2014-10-26: 2 mg via INTRAVENOUS

## 2014-10-26 MED ORDER — HEPARIN SOD (PORK) LOCK FLUSH 100 UNIT/ML IV SOLN
INTRAVENOUS | Status: AC
  Filled 2014-10-26: qty 10

## 2014-10-26 MED ORDER — PHENYLEPHRINE 40 MCG/ML (10ML) SYRINGE FOR IV PUSH (FOR BLOOD PRESSURE SUPPORT)
PREFILLED_SYRINGE | INTRAVENOUS | Status: AC
Start: 2014-10-26 — End: 2014-10-26
  Filled 2014-10-26: qty 10

## 2014-10-26 MED ORDER — LACTATED RINGERS IR SOLN
Status: DC | PRN
  Administered 2014-10-26: 1

## 2014-10-26 MED ORDER — MORPHINE SULFATE 2 MG/ML IJ SOLN
INTRAMUSCULAR | Status: AC
  Filled 2014-10-26: qty 1

## 2014-10-26 MED ORDER — MORPHINE SULFATE 2 MG/ML IJ SOLN: 1.0000 mg | INTRAMUSCULAR | Status: DC | PRN

## 2014-10-26 MED ORDER — ROCURONIUM BROMIDE 100 MG/10ML IV SOLN
INTRAVENOUS | Status: DC | PRN
  Administered 2014-10-26: 50 mg via INTRAVENOUS

## 2014-10-26 MED ORDER — SODIUM CHLORIDE 0.9 % IJ SOLN: 3.0000 mL | INTRAMUSCULAR | Status: DC | PRN

## 2014-10-26 MED ORDER — NICOTINE 14 MG/24HR TD PT24
14.0000 mg | MEDICATED_PATCH | Freq: Every day | TRANSDERMAL | Status: DC
  Administered 2014-10-27 – 2014-10-29 (×3): 14 mg via TRANSDERMAL
  Filled 2014-10-26 (×4): qty 1

## 2014-10-26 MED ORDER — PHENYLEPHRINE HCL 10 MG/ML IJ SOLN
INTRAMUSCULAR | Status: DC | PRN
  Administered 2014-10-26: 40 ug via INTRAVENOUS
  Administered 2014-10-26 (×4): 80 ug via INTRAVENOUS
  Administered 2014-10-26: 40 ug via INTRAVENOUS
  Administered 2014-10-26: 80 ug via INTRAVENOUS
  Administered 2014-10-26 (×2): 40 ug via INTRAVENOUS
  Administered 2014-10-26: 80 ug via INTRAVENOUS
  Administered 2014-10-26: 40 ug via INTRAVENOUS

## 2014-10-26 MED ORDER — HYDROXYUREA 500 MG PO CAPS
500.0000 mg | ORAL_CAPSULE | Freq: Two times a day (BID) | ORAL | Status: DC
  Administered 2014-10-26 – 2014-10-29 (×6): 500 mg via ORAL
  Filled 2014-10-26 (×6): qty 1

## 2014-10-26 MED ORDER — SUCCINYLCHOLINE CHLORIDE 20 MG/ML IJ SOLN
INTRAMUSCULAR | Status: DC | PRN
  Administered 2014-10-26: 100 mg via INTRAVENOUS

## 2014-10-26 MED ORDER — MORPHINE SULFATE 2 MG/ML IJ SOLN
1.0000 mg | INTRAMUSCULAR | Status: DC | PRN
  Administered 2014-10-26 (×5): 2 mg via INTRAVENOUS

## 2014-10-26 MED ORDER — LORAZEPAM 2 MG/ML IJ SOLN
1.0000 mg | INTRAMUSCULAR | Status: DC | PRN
  Administered 2014-10-26: 1 mg via INTRAVENOUS
  Filled 2014-10-26: qty 1

## 2014-10-26 MED ORDER — PROMETHAZINE HCL 25 MG PO TABS
25.0000 mg | ORAL_TABLET | Freq: Three times a day (TID) | ORAL | Status: DC | PRN
  Administered 2014-10-27: 25 mg via ORAL
  Filled 2014-10-26: qty 1

## 2014-10-26 MED ORDER — FENTANYL CITRATE (PF) 250 MCG/5ML IJ SOLN
INTRAMUSCULAR | Status: AC
  Filled 2014-10-26: qty 5

## 2014-10-26 MED ORDER — AMLODIPINE BESYLATE 10 MG PO TABS
10.0000 mg | ORAL_TABLET | Freq: Two times a day (BID) | ORAL | Status: DC
  Administered 2014-10-27 – 2014-10-28 (×2): 10 mg via ORAL
  Filled 2014-10-26 (×4): qty 1

## 2014-10-26 MED ORDER — OLANZAPINE 10 MG PO TABS
20.0000 mg | ORAL_TABLET | Freq: Every day | ORAL | Status: DC
  Administered 2014-10-26 – 2014-10-28 (×3): 20 mg via ORAL
  Filled 2014-10-26 (×4): qty 2

## 2014-10-26 MED ORDER — ACETAMINOPHEN 10 MG/ML IV SOLN
1000.0000 mg | Freq: Four times a day (QID) | INTRAVENOUS | Status: DC
  Administered 2014-10-26: 1000 mg via INTRAVENOUS

## 2014-10-26 MED ORDER — DEXAMETHASONE SODIUM PHOSPHATE 10 MG/ML IJ SOLN
10.0000 mg | Freq: Once | INTRAMUSCULAR | Status: AC
  Administered 2014-10-26: 10 mg via INTRAVENOUS

## 2014-10-26 MED ORDER — BACITRACIN-NEOMYCIN-POLYMYXIN OINTMENT TUBE
TOPICAL_OINTMENT | CUTANEOUS | Status: DC | PRN
  Administered 2014-10-26: 14.2 via TOPICAL
  Filled 2014-10-26: qty 15

## 2014-10-26 MED ORDER — GABAPENTIN 600 MG PO TABS
600.0000 mg | ORAL_TABLET | Freq: Three times a day (TID) | ORAL | Status: DC
  Administered 2014-10-26 – 2014-10-29 (×7): 600 mg via ORAL
  Filled 2014-10-26 (×7): qty 1

## 2014-10-26 MED ORDER — SUFENTANIL CITRATE 50 MCG/ML IV SOLN
INTRAVENOUS | Status: DC | PRN
  Administered 2014-10-26 (×8): 5 ug via INTRAVENOUS
  Administered 2014-10-26: 10 ug via INTRAVENOUS

## 2014-10-26 MED ORDER — PROPOFOL 10 MG/ML IV BOLUS
INTRAVENOUS | Status: AC
  Filled 2014-10-26: qty 20

## 2014-10-26 MED ORDER — SODIUM CHLORIDE 0.9 % IV SOLN
INTRAVENOUS | Status: DC
  Administered 2014-10-26 – 2014-10-27 (×2): via INTRAVENOUS

## 2014-10-26 MED ORDER — VENLAFAXINE HCL ER 75 MG PO CP24
150.0000 mg | ORAL_CAPSULE | Freq: Every day | ORAL | Status: DC
  Administered 2014-10-26 – 2014-10-28 (×3): 150 mg via ORAL
  Filled 2014-10-26 (×3): qty 2

## 2014-10-26 MED ORDER — CHLORHEXIDINE GLUCONATE 4 % EX LIQD: 60.0000 mL | Freq: Once | CUTANEOUS | Status: DC

## 2014-10-26 MED ORDER — PROMETHAZINE HCL 25 MG/ML IJ SOLN
INTRAMUSCULAR | Status: AC
  Filled 2014-10-26: qty 1

## 2014-10-26 MED ORDER — LACTATED RINGERS IV SOLN: INTRAVENOUS | Status: DC

## 2014-10-26 MED ORDER — ALBUTEROL SULFATE (2.5 MG/3ML) 0.083% IN NEBU: 2.5000 mg | INHALATION_SOLUTION | RESPIRATORY_TRACT | Status: DC | PRN

## 2014-10-26 MED ORDER — TIZANIDINE HCL 4 MG PO TABS
4.0000 mg | ORAL_TABLET | Freq: Three times a day (TID) | ORAL | Status: DC
  Administered 2014-10-26 – 2014-10-29 (×7): 4 mg via ORAL
  Filled 2014-10-26 (×12): qty 1

## 2014-10-26 MED ORDER — 0.9 % SODIUM CHLORIDE (POUR BTL) OPTIME
TOPICAL | Status: DC | PRN
Start: 2014-10-26 — End: 2014-10-26
  Administered 2014-10-26: 1000 mL

## 2014-10-26 MED ORDER — FUROSEMIDE 20 MG PO TABS: 20.0000 mg | ORAL_TABLET | Freq: Every day | ORAL | Status: DC | PRN

## 2014-10-26 MED ORDER — ALPRAZOLAM 0.5 MG PO TABS
1.0000 mg | ORAL_TABLET | Freq: Three times a day (TID) | ORAL | Status: DC
  Administered 2014-10-26 – 2014-10-29 (×7): 1 mg via ORAL
  Filled 2014-10-26 (×7): qty 2

## 2014-10-26 MED ORDER — PROMETHAZINE HCL 25 MG/ML IJ SOLN
6.2500 mg | INTRAMUSCULAR | Status: DC | PRN
  Administered 2014-10-26: 12.5 mg via INTRAVENOUS

## 2014-10-26 MED ORDER — ACETAMINOPHEN 650 MG RE SUPP: 650.0000 mg | RECTAL | Status: DC | PRN

## 2014-10-26 MED ORDER — MIRTAZAPINE 15 MG PO TABS
45.0000 mg | ORAL_TABLET | Freq: Every day | ORAL | Status: DC
  Administered 2014-10-26 – 2014-10-28 (×3): 45 mg via ORAL
  Filled 2014-10-26 (×3): qty 3

## 2014-10-26 MED ORDER — LACTATED RINGERS IV SOLN
INTRAVENOUS | Status: DC
  Administered 2014-10-26 (×4): via INTRAVENOUS

## 2014-10-26 MED ORDER — THROMBIN 20000 UNITS EX SOLR
CUTANEOUS | Status: DC | PRN
  Administered 2014-10-26: 20 mL via TOPICAL

## 2014-10-26 MED ORDER — PHENOL 1.4 % MT LIQD: 1.0000 | OROMUCOSAL | Status: DC | PRN

## 2014-10-26 MED ORDER — MIDAZOLAM HCL 2 MG/2ML IJ SOLN
INTRAMUSCULAR | Status: AC
  Filled 2014-10-26: qty 2

## 2014-10-26 MED ORDER — FLEET ENEMA 7-19 GM/118ML RE ENEM: 1.0000 | ENEMA | Freq: Once | RECTAL | Status: AC | PRN

## 2014-10-26 MED ORDER — HEMOSTATIC AGENTS (NO CHARGE) OPTIME
TOPICAL | Status: DC | PRN
  Administered 2014-10-26: 1 via TOPICAL

## 2014-10-26 MED ORDER — ALBUTEROL SULFATE HFA 108 (90 BASE) MCG/ACT IN AERS: 2.0000 | INHALATION_SPRAY | RESPIRATORY_TRACT | Status: DC | PRN

## 2014-10-26 MED ORDER — BUPIVACAINE-EPINEPHRINE (PF) 0.25% -1:200000 IJ SOLN
INTRAMUSCULAR | Status: AC
  Filled 2014-10-26: qty 30

## 2014-10-26 MED ORDER — PHENYLEPHRINE 40 MCG/ML (10ML) SYRINGE FOR IV PUSH (FOR BLOOD PRESSURE SUPPORT)
PREFILLED_SYRINGE | INTRAVENOUS | Status: AC
  Filled 2014-10-26: qty 40

## 2014-10-26 MED ORDER — BISACODYL 5 MG PO TBEC: 5.0000 mg | DELAYED_RELEASE_TABLET | Freq: Every day | ORAL | Status: DC | PRN

## 2014-10-26 MED ORDER — ALPRAZOLAM 1 MG PO TABS: 1.0000 mg | ORAL_TABLET | Freq: Three times a day (TID) | ORAL | Status: DC

## 2014-10-26 MED ORDER — ARTIFICIAL TEARS OP OINT
TOPICAL_OINTMENT | OPHTHALMIC | Status: DC | PRN
Start: 2014-10-26 — End: 2014-10-26
  Administered 2014-10-26: 1 via OPHTHALMIC

## 2014-10-26 MED ORDER — 0.9 % SODIUM CHLORIDE (POUR BTL) OPTIME
TOPICAL | Status: DC | PRN
  Administered 2014-10-26: 1000 mL

## 2014-10-26 MED ORDER — DEXAMETHASONE SODIUM PHOSPHATE 10 MG/ML IJ SOLN
INTRAMUSCULAR | Status: AC
  Filled 2014-10-26: qty 1

## 2014-10-26 MED ORDER — HYDROCODONE-ACETAMINOPHEN 5-325 MG PO TABS
1.0000 | ORAL_TABLET | ORAL | Status: DC | PRN
  Administered 2014-10-28: 2 via ORAL
  Administered 2014-10-28: 1 via ORAL
  Filled 2014-10-26: qty 2
  Filled 2014-10-26: qty 1

## 2014-10-26 MED ORDER — BUPIVACAINE HCL 0.5 % IJ SOLN: INTRAMUSCULAR | Status: DC | PRN

## 2014-10-26 MED ORDER — MIDAZOLAM HCL 2 MG/2ML IJ SOLN
1.0000 mg | Freq: Once | INTRAMUSCULAR | Status: AC
  Administered 2014-10-26: 2 mg via INTRAVENOUS

## 2014-10-26 MED ORDER — MORPHINE SULFATE 2 MG/ML IJ SOLN
1.0000 mg | INTRAMUSCULAR | Status: DC | PRN
  Administered 2014-10-26 (×2): 2 mg via INTRAVENOUS
  Administered 2014-10-26 – 2014-10-27 (×3): 4 mg via INTRAVENOUS
  Administered 2014-10-27: 2 mg via INTRAVENOUS
  Administered 2014-10-27: 4 mg via INTRAVENOUS
  Filled 2014-10-26: qty 1
  Filled 2014-10-26 (×2): qty 2
  Filled 2014-10-26: qty 1
  Filled 2014-10-26 (×2): qty 2
  Filled 2014-10-26: qty 1

## 2014-10-26 MED ORDER — THROMBIN 20000 UNITS EX SOLR
CUTANEOUS | Status: AC
  Filled 2014-10-26: qty 20000

## 2014-10-26 MED ORDER — CHLORHEXIDINE GLUCONATE 4 % EX LIQD
60.0000 mL | Freq: Once | CUTANEOUS | Status: DC
Start: 2014-10-26 — End: 2014-10-26

## 2014-10-26 MED ORDER — POLYETHYLENE GLYCOL 3350 17 G PO PACK: 17.0000 g | PACK | Freq: Every day | ORAL | Status: DC | PRN

## 2014-10-26 MED ORDER — MIDAZOLAM HCL 5 MG/5ML IJ SOLN
INTRAMUSCULAR | Status: DC | PRN
  Administered 2014-10-26: 2 mg via INTRAVENOUS

## 2014-10-26 MED ORDER — VANCOMYCIN HCL IN DEXTROSE 1-5 GM/200ML-% IV SOLN
1000.0000 mg | Freq: Once | INTRAVENOUS | Status: AC
Start: 2014-10-26 — End: 2014-10-27
  Administered 2014-10-27: 1000 mg via INTRAVENOUS
  Filled 2014-10-26 (×3): qty 200

## 2014-10-26 MED ORDER — SODIUM CHLORIDE 0.9 % IV SOLN: 250.0000 mL | INTRAVENOUS | Status: DC

## 2014-10-26 MED ORDER — ACETAMINOPHEN 10 MG/ML IV SOLN
INTRAVENOUS | Status: AC
  Filled 2014-10-26: qty 100

## 2014-10-26 MED ORDER — MEPERIDINE HCL 25 MG/ML IJ SOLN: 12.5000 mg | INTRAMUSCULAR | Status: DC | PRN

## 2014-10-26 MED ORDER — ONDANSETRON HCL 4 MG/2ML IJ SOLN: 4.0000 mg | INTRAMUSCULAR | Status: DC | PRN

## 2014-10-26 MED ORDER — NITROGLYCERIN 0.4 MG SL SUBL: 0.4000 mg | SUBLINGUAL_TABLET | SUBLINGUAL | Status: DC | PRN

## 2014-10-26 MED ORDER — ASPIRIN EC 325 MG PO TBEC
325.0000 mg | DELAYED_RELEASE_TABLET | Freq: Every day | ORAL | Status: DC
  Administered 2014-10-27 – 2014-10-29 (×3): 325 mg via ORAL
  Filled 2014-10-26 (×3): qty 1

## 2014-10-26 MED ORDER — HEPARIN SOD (PORK) LOCK FLUSH 100 UNIT/ML IV SOLN
500.0000 [IU] | INTRAVENOUS | Status: AC | PRN
  Administered 2014-10-26: 500 [IU]

## 2014-10-26 MED ORDER — DOCUSATE SODIUM 100 MG PO CAPS
100.0000 mg | ORAL_CAPSULE | Freq: Two times a day (BID) | ORAL | Status: DC
  Administered 2014-10-26 – 2014-10-29 (×6): 100 mg via ORAL
  Filled 2014-10-26 (×6): qty 1

## 2014-10-26 MED ORDER — MENTHOL 3 MG MT LOZG: 1.0000 | LOZENGE | OROMUCOSAL | Status: DC | PRN

## 2014-10-26 SURGICAL SUPPLY — 80 items
BAG DECANTER FOR FLEXI CONT (MISCELLANEOUS) IMPLANT
BLADE SURG ROTATE 9660 (MISCELLANEOUS) IMPLANT
BNDG COHESIVE 4X5 TAN STRL (GAUZE/BANDAGES/DRESSINGS) ×2 IMPLANT
BONE CANC CHIPS 20CC PCAN1/4 (Bone Implant) ×2 IMPLANT
BONE MATRIX VIVIGEN 1CC (Bone Implant) ×4 IMPLANT
BUR RND FLUTED 2.5 (BURR) ×2 IMPLANT
BUR SABER RD CUTTING 3.0 (BURR) IMPLANT
CAGE CONCORDE BULLET 9X9X23 (Cage) ×1 IMPLANT
CAGE SPNL 5D BLT 23X9X9X (Cage) ×1 IMPLANT
CHIPS CANC BONE 20CC PCAN1/4 (Bone Implant) ×1 IMPLANT
CLSR STERI-STRIP ANTIMIC 1/2X4 (GAUZE/BANDAGES/DRESSINGS) ×2 IMPLANT
COVER SURGICAL LIGHT HANDLE (MISCELLANEOUS) ×2 IMPLANT
DERMABOND ADHESIVE PROPEN (GAUZE/BANDAGES/DRESSINGS) ×1
DERMABOND ADVANCED (GAUZE/BANDAGES/DRESSINGS) ×1
DERMABOND ADVANCED .7 DNX12 (GAUZE/BANDAGES/DRESSINGS) ×1 IMPLANT
DERMABOND ADVANCED .7 DNX6 (GAUZE/BANDAGES/DRESSINGS) ×1 IMPLANT
DRAPE C-ARM 42X72 X-RAY (DRAPES) ×2 IMPLANT
DRAPE C-ARMOR (DRAPES) ×2 IMPLANT
DRAPE PROXIMA HALF (DRAPES) ×2 IMPLANT
DRAPE SURG 17X23 STRL (DRAPES) ×8 IMPLANT
DRAPE TABLE COVER HEAVY DUTY (DRAPES) ×2 IMPLANT
DRSG MEPILEX BORDER 4X4 (GAUZE/BANDAGES/DRESSINGS) IMPLANT
DRSG MEPILEX BORDER 4X8 (GAUZE/BANDAGES/DRESSINGS) ×2 IMPLANT
DURAPREP 26ML APPLICATOR (WOUND CARE) ×2 IMPLANT
ELECT BLADE 6.5 EXT (BLADE) IMPLANT
ELECT CAUTERY BLADE 6.4 (BLADE) ×2 IMPLANT
ELECT REM PT RETURN 9FT ADLT (ELECTROSURGICAL) ×2
ELECTRODE REM PT RTRN 9FT ADLT (ELECTROSURGICAL) ×1 IMPLANT
EVACUATOR 1/8 PVC DRAIN (DRAIN) IMPLANT
GAUZE SPONGE 4X4 12PLY STRL (GAUZE/BANDAGES/DRESSINGS) ×2 IMPLANT
GLOVE BIOGEL PI IND STRL 8 (GLOVE) ×1 IMPLANT
GLOVE BIOGEL PI IND STRL 8.5 (GLOVE) ×1 IMPLANT
GLOVE BIOGEL PI INDICATOR 8 (GLOVE) ×1
GLOVE BIOGEL PI INDICATOR 8.5 (GLOVE) ×1
GLOVE ECLIPSE 9.0 STRL (GLOVE) ×2 IMPLANT
GLOVE ORTHO TXT STRL SZ7.5 (GLOVE) IMPLANT
GLOVE SURG 8.5 LATEX PF (GLOVE) IMPLANT
GLOVE SURG SS PI 6.5 STRL IVOR (GLOVE) ×10 IMPLANT
GLOVE SURG SS PI 7.5 STRL IVOR (GLOVE) ×14 IMPLANT
GLOVE SURG SS PI 8.5 STRL IVOR (GLOVE) ×3
GLOVE SURG SS PI 8.5 STRL STRW (GLOVE) ×3 IMPLANT
GOWN STRL REUS W/ TWL LRG LVL3 (GOWN DISPOSABLE) ×6 IMPLANT
GOWN STRL REUS W/TWL 2XL LVL3 (GOWN DISPOSABLE) ×4 IMPLANT
GOWN STRL REUS W/TWL LRG LVL3 (GOWN DISPOSABLE) ×6
KIT BASIN OR (CUSTOM PROCEDURE TRAY) ×2 IMPLANT
KIT POSITION SURG JACKSON T1 (MISCELLANEOUS) ×2 IMPLANT
KIT ROOM TURNOVER OR (KITS) ×2 IMPLANT
NEEDLE 22X1 1/2 (OR ONLY) (NEEDLE) ×2 IMPLANT
NEEDLE ASP BONE MRW 11GX15 (NEEDLE) IMPLANT
NEEDLE BONE MARROW 8GAX6 (NEEDLE) IMPLANT
NEEDLE SPNL 18GX3.5 QUINCKE PK (NEEDLE) ×2 IMPLANT
NS IRRIG 1000ML POUR BTL (IV SOLUTION) ×2 IMPLANT
PACK LAMINECTOMY ORTHO (CUSTOM PROCEDURE TRAY) ×2 IMPLANT
PAD ARMBOARD 7.5X6 YLW CONV (MISCELLANEOUS) ×6 IMPLANT
PATTIES SURGICAL .75X.75 (GAUZE/BANDAGES/DRESSINGS) ×2 IMPLANT
PATTIES SURGICAL 1X1 (DISPOSABLE) ×2 IMPLANT
ROD PRE BENT EXP 40MM (Rod) ×4 IMPLANT
SCREW SET SINGLE INNER (Screw) ×8 IMPLANT
SCREW VIPER 6MMX25MM (Screw) ×2 IMPLANT
SCREW VIPER 7MMX30MM CORTICAL (Screw) ×6 IMPLANT
SPONGE LAP 4X18 X RAY DECT (DISPOSABLE) ×2 IMPLANT
SPONGE SURGIFOAM ABS GEL 100 (HEMOSTASIS) ×2 IMPLANT
STAPLER VISISTAT 35W (STAPLE) ×2 IMPLANT
SURGIFLO TRUKIT (HEMOSTASIS) ×2 IMPLANT
SUT VIC AB 0 CT1 27 (SUTURE) ×1
SUT VIC AB 0 CT1 27XBRD ANBCTR (SUTURE) ×1 IMPLANT
SUT VIC AB 1 CTX 36 (SUTURE) ×2
SUT VIC AB 1 CTX36XBRD ANBCTR (SUTURE) ×2 IMPLANT
SUT VIC AB 2-0 CT1 27 (SUTURE) ×1
SUT VIC AB 2-0 CT1 TAPERPNT 27 (SUTURE) ×1 IMPLANT
SUT VIC AB 3-0 X1 27 (SUTURE) ×4 IMPLANT
SUT VICRYL 0 CT 1 36IN (SUTURE) ×4 IMPLANT
SYR 20CC LL (SYRINGE) ×2 IMPLANT
SYR CONTROL 10ML LL (SYRINGE) ×4 IMPLANT
TOWEL OR 17X24 6PK STRL BLUE (TOWEL DISPOSABLE) ×2 IMPLANT
TOWEL OR 17X26 10 PK STRL BLUE (TOWEL DISPOSABLE) ×2 IMPLANT
TRAY FOLEY BAG SILVER LF 16FR (CATHETERS) ×2 IMPLANT
TRAY FOLEY CATH 16FRSI W/METER (SET/KITS/TRAYS/PACK) ×2 IMPLANT
WATER STERILE IRR 1000ML POUR (IV SOLUTION) ×2 IMPLANT
YANKAUER SUCT BULB TIP NO VENT (SUCTIONS) ×2 IMPLANT

## 2014-10-26 NOTE — Progress Notes (Signed)
Chaplain was requested by Pt at office. Chaplain prayer with Pt while x rays were being taken. Pt was in tremendous pain.    10/26/14 1600  Clinical Encounter Type  Visited With Patient  Visit Type Spiritual support  Referral From Chaplain  Spiritual Encounters  Spiritual Needs Prayer;Emotional  Stress Factors  Patient Stress Factors Not reviewed

## 2014-10-26 NOTE — Brief Op Note (Addendum)
10/26/2014  3:30 PM  PATIENT:  [name]  49 y.o. female  PRE-OPERATIVE DIAGNOSIS:  painful spondylolisthesis L5-S1 with spondylosis  POST-OPERATIVE DIAGNOSIS:  painful spondylolisthesis L5-S1 with spondylosis  PROCEDURE:  Procedure(s): LUMBAR FUSION L5-S1 WITH PEDICLE SCREWS, RODS, CAGE, VIVIGEN, ALLOGRAFT BONE GRAFT (N/A)  SURGEON:  Surgeon(s) and Role:    * Jessy Oto, MD - Primary  PHYSICIAN ASSISTANT:James Ricard Dillon, [place]   ANESTHESIA:   general Dr. Massagee/ Dr. Kalman Shan.  EBL:  Total I/O In: 2100 [I.V.:2100] Out: 450 [Urine:210; Blood:240]  BLOOD ADMINISTERED:none  DRAINS: Urinary Catheter (Foley)   LOCAL MEDICATIONS USED:  NONE  SPECIMEN:  No Specimen  DISPOSITION OF SPECIMEN:  N/A  COUNTS:  YES  DICTATION: .Dragon Dictation  PLAN OF CARE: Admit to inpatient   PATIENT DISPOSITION:  PACU - hemodynamically stable.   Delay start of Pharmacological VTE agent (>24hrs) due to surgical blood loss or risk of bleeding: yes

## 2014-10-26 NOTE — OR Nursing (Signed)
[name]  Presented to Short Stay as a difficult stick and refused an IV. She had an indwelling port in place for Sickle Cell treatment and requested that her port-a-cath be used in place of an IV. I expressed my hesitation due to the positioning for the surgical procedure, as [place] would be prone and the port-a-cath would be positioned directly on a chest pad. I felt that the pressure on the device with a needle in it may damage the device. It was then decided that she would have a central line placed after intubation. The central line was placed after intubation but the placement was difficult and it took no less than an hour to place the line. The IV Team was then called in to d/c the port-a-cath line. The was done in accordance with policy and procedure. During this time, the patient was covered with blankets and a thermal warming device (aka Bair Hugger) to promote adequate warming. After the IV team finished, we positioned [name], ensured her safety and that her vitals were stable, prepped the surgical site, and draped. The case started at 0925.

## 2014-10-26 NOTE — Op Note (Signed)
10/26/2014  1:51 PM  PATIENT:  [name]  49 y.o. female  MRN: [id number]  OPERATIVE REPORT  PRE-OPERATIVE DIAGNOSIS:  painful spondylolisthesis L5-S1 with spondylosis  POST-OPERATIVE DIAGNOSIS:  painful spondylolisthesis L5-S1 with spondylosis  PROCEDURE:  Procedure(s): LUMBAR FUSION L5-S1 WITH PEDICLE SCREWS, RODS, CAGE, VIVIGEN, ALLOGRAFT BONE GRAFT    SURGEON:  Jessy Oto, MD     ASSISTANT:  Benjiman Core, PA-C  (Present throughout the entire procedure and necessary for completion of procedure in a timely manner)     ANESTHESIA:  General, Dr. Massagee/Dr. Kalman Shan.    COMPLICATIONS:  None.   EBL: 240cc  DRAINS:Foley to SD.    COMPONENTS  Implant Name Type Inv. Item Serial No. Manufacturer Lot No. LRB No. Used  BONE MATRIX VIVIGEN 1CC - [id number] Bone Implant BONE MATRIX VIVIGEN 1CC [id number] [employer]  N/A 1  BONE MATRIX VIVIGEN 1CC - [id number] Bone Implant BONE MATRIX VIVIGEN 1CC [id number] [employer]  N/A 1  BONE CANC CHIPS 20CC - [id number] Bone Implant BONE CANC CHIPS 20CC [id number] [employer]  N/A 1  SCREW SET SINGLE INNER - [id number] Screw SCREW SET SINGLE INNER  DEPUY SPINE  N/A 4  ROD PRE BENT EXP 40MM - [id number] Rod ROD PRE BENT EXP 40MM  DEPUY SPINE  N/A 2  CAGE CONCORDE BULLET 9X9X23 - [id number] Cage CAGE CONCORDE BULLET [id number]  DEPUY SPINE  N/A 1  VIPER [id number] SCREW (CORTICAL)     DEPUY SPINE  N/A 3  VIPER [id number] SCREW        DEPUY SPINE   N/A 1  :   PROCEDURE: The patient was met in the holding area, and the appropriate lumbar levels left L5-S1 identified and marked with an "X" and my initials. I had discussion with the patient in the preop holding area regarding consent and all questions regarding surgery were answered.The fusion level reidentified as L5-S1. Patient understands the rationale to perform TLIF to decompress the left L5-S1 lateral recess and foramenal stenosis and  to allow for maintenance of lumbar lordosis. The patient was then transported to OR and was placed under general anestheticwithout difficulty. The patient received appropriate preoperative antibiotic prophylaxis 1 gm Vancomycin for penicillin anaphylaxis history. Anesthesiology, Dr Kalman Shan placed a left internal jugular line. Nursing staff inserted a Foley catheter under sterile conditions. The patient was then turned to a prone position using the [facility] spine frame, PAS, all pressure points well padded the arms at the side to 90 90. Standard prep with DuraPrep solution draped in the usual manner from the lower lumbar spine to  the mid sacral segment. Iodine Vi-Drape was used. Time-out procedure was called and correct.  Incision was then made  extending from L4-S2  through the skin and subcutaneous layers down to the patient's lumbodorsal fascia and spinous processes. The incision then carried sharply excising the supraspinous ligament and then continuing the lateral aspect of the spinous processes of L4, L5 and S1. Cobb elevator used to carefully elevate the paralumbar muscles off of the posterior elements using electrocautery carefully drilled bleeding and perform dissection of the muscle tissues of the preserving the facet capsule at the L4-5 level. Continuing the exposure out laterally to expose the lateral margin of the facet joint line at L4-5 and L5-S1. Incision was carried in the midline down to the S1 level area bleeders controlled using electrocautery monopolar electrocautery.  The first open interlaminar spinous space identified as the L5-S1 level. Insight  self retaining retractor was used for the upper part of the incision and the Boss McCollough retractor distally. C-arm fluoroscopy was then brought into the field and using C-arm fluoroscopy then a hole made into the medial aspect of the pedicle of left pedicle of L5 observed in the pedicle using ball tipped nerve hook and hockey stick nerve probe  initial entry was determined on fluoroscopy to be good position alignment so that a 4.69m tap was then used to tap the left L5 pedicle to a depth of nearly 30 mm observed on C-arm fluoroscopy to be beyond the posterior one third of the lumbar vertebra and good position alignment within the left L5 pedicle this was then removed and the pedicle channel probed demonstrating patency no sign of rupture the cortex of the pedicle. Tapping with a 5 mm screw tap then 6 mm tap, then 6.046mx 25 mm screw was placed on the left side at the L5 level. The pedicle channel of L5 on the left probed demonstrating patency no sign of rupture the cortex of the pedicle. Viper corticocancelous screw for fixation of this level was measured as 6.0 mm x 25 mm screw so  was placed on the left side at the L5 level. C-arm fluoroscopy was then brought into the field and using C-arm fluoroscopy then a hole made into the posterior and medial aspect of the right pedicle of L5 observed in the pedicle using ball tipped nerve hook and hockey stick nerve probe initial entry was determined on fluoroscopy to be good position alignment so that a 4.44m48map was then used to tap the left L5 pedicle to a depth of nearly 30 mm observed on C-arm fluoroscopy to be beyond the posterior one third of the lumbar vertebra and good position alignment within the right L5 pedicle this was then removed and the pedicle channel probed demonstrating patency no sign of rupture the cortex of the pedicle. Tapping with a 5 mm screw tap the 6.44mm86md 7 mm taps then 7.44mm 41m0 mm screw was placed on the right side at the L5 level. The pedicle channel of L5 on the right probed demonstrating patency no sign of rupture the cortex of the pedicle. Viper screw for fixation of this level was measured as 5.0 mm x 30 mm screw left for insertion after decompression and TLIF. C-arm fluoroscopy was then brought into the field and using C-arm fluoroscopy then the hole into the pedicle of left  S1 was placed and observed with ball-tipped probe the S1 pedicle on this side was 7.0 mm x 30 mm. A 7.44mm X29mmm c48mully passed down the center of the left S1 pedicle to a depth of nearly 30 mm. Observed on C-arm fluoroscopy to be in good position alignment channel was probed with a ball-tipped probe ensure patency no sign of cortical disruption. Following tapping with a 5 mm tap then 6.0 mm and 7 mm taps and a 6.0 x 30 mm screw was placed on the left side pedicle at S1. C-arm fluoroscopy was used to localize the hole made in the medial aspect of the pedicle of S1 on the right localizing the pedicle within the spinal canal with nerve hook and hockey-stick nerve probe carefully passed down the center of the S1 pedicle to a depth of nearly 30 mm. Observed on C-arm fluoroscopy to be in good position alignment channel was probed with a ball-tipped probe ensure predicle screw  to be placed on the right side at the  S1 level following decompression and TLIFs..C-arm fluoroscopy was used to localize the hole made in the medial aspect of the pedicle of S1 on the right localizing the pedicle within the spinal canal with nerve hook and hockey-stick nerve probe for patency no sign of cortical disruption. Following tapping with a 4 mm, 1m and 6 mm taps a 6.0 x 30 mm screw was placed on right side at the S1 level. The inferior 50% of the right lamina of L5 was resected and Leksell rongeur used to resect inferior aspect of the lamina on the right side at the L5 level and partially on the right side at L5 The right medial 40% of the facet of  L5-S1 was resected in order to decompress the right side of the lumbar thecal sac at L5-S1 and decompress the right L5 and S1 neuroforamen. Osteotomes and 258mand 88m2merrisons were used for this portion of the decompression. Similarly the left side decompression was carried out but complete facetectomy was perform on the left at L5-S1 to provide for exposure of the left side L5-S1  neuroforamen for ease of placement of TLIF (transforaminal lumbar interbody fusion) at this level inferior portions of the lamina and pars were also resected first beginning with the Leksell rongeur and osteotomes and then resecting using 2 and 3 mm Kerrison. Continued laminectomy was carried out resecting the central portions of the lamina of L4 and L5 performing foraminotomies on the right side at the L4, L5 and S1levels. The inferior articular process of L5 was resected on the right side. The L5 nerve root identified bilaterally at the medial aspect of the L5 pedicle. 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 Loupe magnification and headlight were used during this portion procedure.  Attention then turned to placement of the transforaminal lumbar interbody fusion cages. Using a Penfield 4 the left lateral aspect of the thecal sac at the L5-S1 disc space was carefully freed up The thecal sac could then easily be retracted in the posterior lateral aspect of the L5-S1 disc was exposed 15 blade scalpel used to incise the posterolateral disc and an osteotome used to resect a small portion of bone off the superior aspect of the posterior superior vertebral body of S1 in order to ease the entry into the L5-S1 disc space. A  2mm71mrrison rongeur was then able to be introduced in the disc space debrided it was quite narrow. 7 mm dilator was used to dialate the L5-S1 disc space on the left side then dilated further to 8 mm and the 9 mm was successful and using small curettes and the disc space was debrided a minimal degenerative disc present in the endplates debrided to bleeding endplate bone. Shavers were inserted to trial the intervertebral disc space. A 9 mm x 288mm80muy concord lordotic cage was carefully packed with morcellized bone graft that had been harvested from previous laminotomies in addition to  vivigen 2 cc.The cage was then inserted with the articulating insertion handle.  Additional bone graft was then packed into the intervertebral disc space prior to the insertion of the cage, using a 7 mm trial cage to impact the allograft bone graft in addition to vivigen and local bone graft. Bleeding controlled using bipolar electrocautery thrombin soaked gel cottonoids. Observed on C-arm fluoroscopy the cage was found to be in good position alignment. The cages at  L5-S1 was placed anteriorly  as best as possible maintain the patient's lordosis that was present. With this then the transforaminal lumbar interbody fusion portion of the case was completed bleeders were controlled using bipolar electrocautery thrombin-soaked Gelfoam were appropriate.Decortication of the facet joints carried out bilateral L5-S1. These were packed with cancellous local bone graft. 4 pedicle screws on the already in place, the fasteners were carefully aligned  to allow for placement of rods. The left side first quarter inch titanium rod was then carefully placed using a precontoured 40 mm Rod.This was then placed into the pedicle screws on the left extending from L5-S1 each of the caps were carefully placed loosely tightened. Attention turned to the right side were similarly and then screws were carefully adjusted to allow for a better pattern screws to allow for placement of fixation of the rod a quarter inch 40 mm precontoured titanium rod was then carefully placed. This was able to be inserted into the right pedicle screw fasteners L5 and S1, Caps on both the L5 fasteners were tightened to 80 foot lbs. Across the left side L5 and S1 screw fasteners compression was obtained on the left side between L5 and S1 by compressing between the fasteners and tightening the screw caps 80 pounds. Similarly this was done on the right side at L5-S1 obtaining compression and tightening to 80 foot pounds. Irrigation was carried out with copious  amounts of saline solution this was done throughout the case. Cell Saver was used during the case. But no blood was returned due to low blood loss. Hockey stick neuroprobe was used to probe the neuroforamen bilateral L5 and S1, these were determined to be well decompressed. Permanent C-arm images were obtained in AP and lateral plane and oblique planes. Remaining local bone graft was then applied along both lateral posterior lateral region extending from L5 toS1 facet beds.Gelfoam was then removed spinal canal the lumbodorsal musculature carefully exam debrided of any devitalized tissue following removal of Viper retractors were the bleeders were controlled using electrocautery and the area dorsal lumbar muscle were then approximated in the midline with interrupted #1 Vicryl sutures loose the dorsal fascia was reattached to the spinous process of L4 to superiorly and S1 inferiorly this was done with #1 Vicryl sutures. Subcutaneous layers then approximated using interrupted 0 Vicryl sutures and 2-0 Vicryl sutures. Skin was closed with a running subcutaneous stitch of 4-0 Vicryl Dermabond was applied then MedPlex bandage. All instrument and sponge counts were correct. The patient was then returned to a supine position on her bed reactivated extubated and returned to the recovery room in satisfactory condition.   Benjiman Core, PA-C perform the duties of assistant surgeon during this case. He was present from the beginning of the case to the end of the case assisting in transfer the patient from his stretcher to the OR table and back to the stretcher at the end of the case. Assisted in careful retraction and suction of the laminectomy site delicate neural structures operating under the operating room microscope. He performed closure of the incision from the fascia to the skin applying the dressing.         Esty Ahuja E  10/26/2014, 1:51 PM

## 2014-10-26 NOTE — Progress Notes (Signed)
Pt is admitted to 4N11 from PACU. Admission vital sign is stable

## 2014-10-26 NOTE — Progress Notes (Signed)
ANTIBIOTIC CONSULT NOTE - INITIAL  Pharmacy Consult for vancomycin Indication: surgical prophylaxis  Allergies  Allergen Reactions  . Latex Shortness Of Breath, Itching, Swelling and Rash    Also Tongue swelling; has been intubated secondary to this reaction   . Lidocaine Anaphylaxis    Occurred 11/14 with right mastectomy  . Metoclopramide Hcl Shortness Of Breath, Itching, Swelling and Rash    Also tongue swelling   . Penicillins Shortness Of Breath, Itching, Swelling and Rash    Also tongue swelling   . Fentanyl Itching and Rash  . Hydromorphone Hcl Itching and Rash  . Iohexol Itching and Rash  . Ketorolac Tromethamine Itching and Rash  . Ondansetron Hcl Itching and Rash  . Prochlorperazine Edisylate Itching and Rash    Patient Measurements: Height: 5\' 4"  (162.6 cm) Weight: 210 lb (95.255 kg) IBW/kg (Calculated) : 54.7 Adjusted Body Weight:   Vital Signs: Temp: 99.2 F (37.3 C) (06/28 1727) Temp Source: Oral (06/28 0600) BP: 92/77 mmHg (06/28 1724) Pulse Rate: 99 (06/28 1724) Intake/Output from previous day:   Intake/Output from this shift: Total I/O In: 2100 [I.V.:2100] Out: 1450 [Urine:1210; Blood:240]  Labs:  Recent Labs  10/26/14 1722  WBC PENDING  HGB 11.3*  PLT 286   Estimated Creatinine Clearance: 97.3 mL/min (by C-G formula based on Cr of 0.8). No results for input(s): VANCOTROUGH, VANCOPEAK, VANCORANDOM, GENTTROUGH, GENTPEAK, GENTRANDOM, TOBRATROUGH, TOBRAPEAK, TOBRARND, AMIKACINPEAK, AMIKACINTROU, AMIKACIN in the last 72 hours.   Microbiology: Recent Results (from the past 720 hour(s))  Surgical pcr screen     Status: None   Collection Time: 10/20/14 10:20 AM  Result Value Ref Range Status   MRSA, PCR NEGATIVE NEGATIVE Final   Staphylococcus aureus NEGATIVE NEGATIVE Final    Comment:        The Xpert SA Assay (FDA approved for NASAL specimens in patients over 73 years of age), is one component of a comprehensive surveillance program.   Test performance has been validated by [facility] for patients greater than or equal to 19 year old. It is not intended to diagnose infection nor to guide or monitor treatment.     Medical History: Past Medical History  Diagnosis Date  . Somatization disorder   . Depression   . Anxiety   . Beta thalassemia trait   . CVA (cerebral infarction)   . Pancreatitis   . Hypertension   . Coronary artery disease   . Stroke     years ago.  no deficits now  . Sleep apnea     tested more than 5-6 yrs ago.  Unable to get mask d/t lack of insurance at the time  . Shortness of breath dyspnea     states she does use inhaler, but has never been dx with asthma or bronchitis.  she does smoke however  . History of hiatal hernia   . Breast cancer     breast cancer on right--mastectomy and has implant  . Sickle cell anemia     Medications:  Scheduled:  . acetaminophen      . ALPRAZolam  1 mg Oral TID  . amLODipine  10 mg Oral BID  . aspirin EC  325 mg Oral Daily  . dexamethasone      . docusate sodium  100 mg Oral BID  . famotidine  20 mg Oral QHS  . gabapentin  600 mg Oral TID  . hydroxyurea  500 mg Oral BID  . lisinopril  20 mg Oral Daily  . midazolam      .  mirtazapine  45 mg Oral QHS  . morphine      . morphine      . morphine      . morphine      . morphine      . OLANZapine  20 mg Oral QHS  . promethazine      . sodium chloride  3 mL Intravenous Q12H  . tiZANidine  4 mg Oral TID  . venlafaxine XR  150 mg Oral QHS  . zolpidem  10 mg Oral QHS   Infusions:  . sodium chloride    . sodium chloride     Assessment: 49 yo female s/p surgery will be given vancomycin for surgical prophylaxis.  Per RN, patient doesn't have a drain.  Patient got one dose of vancomycin 1 g iv @ 0730  Goal of Therapy:  Vancomycin trough level 10-15 mcg/ml  Plan:  - vancomycin 1g iv @ 1930 tonight. Pharmacy will sign off   Madsen Riddle, Tsz-Yin 10/26/2014,5:54 PM

## 2014-10-26 NOTE — Anesthesia Procedure Notes (Signed)
Date/Time: 10/26/2014 7:48 AM Performed by: Laretta Alstrom Pre-anesthesia Checklist: Patient identified, Emergency Drugs available, Suction available and Patient being monitored Patient Re-evaluated:Patient Re-evaluated prior to inductionOxygen Delivery Method: Circle system utilized Preoxygenation: Pre-oxygenation with 100% oxygen Intubation Type: IV induction Ventilation: Mask ventilation without difficulty Laryngoscope Size: Glidescope Grade View: Grade III Tube type: Oral Tube size: 7.0 mm Airway Equipment and Method: Stylet and Video-laryngoscopy Placement Confirmation: ETT inserted through vocal cords under direct vision,  positive ETCO2,  CO2 detector and breath sounds checked- equal and bilateral Secured at: 22 cm Tube secured with: Tape Dental Injury: Teeth and Oropharynx as per pre-operative assessment  Difficulty Due To: Difficulty was anticipated and Difficult Airway- due to anterior larynx Future Recommendations: Recommend- induction with short-acting agent, and alternative techniques readily available Comments: View was not optimal with glidescope but ETT was placed on 1st attempt.

## 2014-10-26 NOTE — Anesthesia Preprocedure Evaluation (Signed)
Anesthesia Evaluation  Patient identified by MRN, date of birth, ID band Patient awake    Reviewed: Allergy & Precautions, NPO status , Patient's Chart, lab work & pertinent test results  History of Anesthesia Complications Negative for: history of anesthetic complications  Airway Mallampati: II  TM Distance: >3 FB Neck ROM: Full  Mouth opening: Limited Mouth Opening  Dental  (+) Teeth Intact   Pulmonary shortness of breath, sleep apnea , former smoker,  breath sounds clear to auscultation        Cardiovascular hypertension, + CAD and + Peripheral Vascular Disease Rhythm:Regular Rate:Normal + Systolic murmurs    Neuro/Psych    GI/Hepatic PUD,   Endo/Other    Renal/GU      Musculoskeletal  (+) Arthritis -,   Abdominal   Peds  Hematology  (+) Sickle cell anemia and anemia ,   Anesthesia Other Findings   Reproductive/Obstetrics                             Anesthesia Physical Anesthesia Plan  ASA: IV  Anesthesia Plan: General   Post-op Pain Management:    Induction: Intravenous  Airway Management Planned: Oral ETT  Additional Equipment:   Intra-op Plan:   Post-operative Plan: Extubation in OR  Informed Consent:   Dental advisory given  Plan Discussed with: CRNA and Surgeon  Anesthesia Plan Comments:         Anesthesia Quick Evaluation

## 2014-10-26 NOTE — Interval H&P Note (Signed)
History and Physical Interval Note:  10/26/2014 7:29 AM  [name]  has presented today for surgery, with the diagnosis of painful spondylolisthesis L5-S1 with spondylosis  The various methods of treatment have been discussed with the patient and family. After consideration of risks, benefits and other options for treatment, the patient has consented to  Procedure(s): LUMBAR FUSION L5-S1 WITH PEDICLE SCREWS, RODS, CAGE, VIVIGEN, ALLOGRAFT BONE GRAFT (N/A) as a surgical intervention .  The patient's history has been reviewed, patient examined, no change in status, stable for surgery.  I have reviewed the patient's chart and labs.  Questions were answered to the patient's satisfaction.     Hampton Cost E

## 2014-10-26 NOTE — H&P (Signed)
[name] is an 49 y.o. female.    [name] is seen today.  She is complaining of persistent low back pain and pain into her left hip.  She reports that it is worsening.     RADIOGRAPHS:  She underwent myelogram and post myelogram CT scan done primarily because of severe claustrophobia and difficulty with undergoing an MRI scan.  She has been prescribed hydrocodone for discomfort she is having.  She has a spondylolisthesis at the L5-S1 level.  A CT scan was done on 09/24/2014, with myelogram.  It was compared to a study done in December 2014.  The study indicates that she has a normal conus behind the L2 level with no sign of compression in this area.  Also, no sign of significant compression noted at any of the levels seen.  She does have a minimal disc bulge with a well open central and neural foramen at the L4-5 level.  Mild disc bulge with facet degenerative changes and vacuum phenomenon on the right.  Early spur formation on the left at the L5-S1 level.  There is 5 mm of anterior listhesis without dynamic instability noted with flexion and extension radiographs.  There is no pars defect present.  She is felt to have severe facet disease at L5-S1 with degenerative grade 1 spondylolisthesis of 5 mm.  No compression identified at any level.  Disc bulge noted at L4-5.   I discussed the findings with Arvis and I have indicated that the studies really do not show nerve compression to be the issue regarding her back and that the pain she is experiencing in her groin potentially may be related to mechanical back pain or pain referred into her hip, but not necessarily due to nerve compression, although she does have a shift in the vertebrae at the L5-S1 level.   FAMILY HISTORY:  Significant for cancer and heart disease.     PAST MEDICAL HISTORY:  Significant for high blood pressure, depression, sickle cell disease, heart disease, and anxiety.       Past Medical History  Diagnosis Date  .  Somatization disorder   . Depression   . Anxiety   . Beta thalassemia trait   . CVA (cerebral infarction)   . Pancreatitis   . Hypertension   . Coronary artery disease   . Stroke     years ago.  no deficits now  . Sleep apnea     tested more than 5-6 yrs ago.  Unable to get mask d/t lack of insurance at the time  . Shortness of breath dyspnea     states she does use inhaler, but has never been dx with asthma or bronchitis.  she does smoke however  . History of hiatal hernia   . Breast cancer     breast cancer on right--mastectomy and has implant  . Sickle cell anemia     Past Surgical History  Procedure Laterality Date  . Tubal ligation    . Cesarean section    . Partial hysterectomy    . Cardiac catheterization    . Breast lumpectomy    . Port a cath revision      states she's had it for several yrs for sickle cell.    Family History  Problem Relation Age of Onset  . Diabetes Mother   . Diabetes Father   . Hyperlipidemia Mother   . Hyperlipidemia Father   . Hypertension Mother   . Hypertension Father    Social  History:  reports that she has quit smoking. Her smoking use included Cigarettes. She has a 12.5 pack-year smoking history. She does not have any smokeless tobacco history on file. She reports that she uses illicit drugs. She reports that she does not drink alcohol.  Allergies:  Allergies  Allergen Reactions  . Latex Shortness Of Breath, Itching, Swelling and Rash    Also Tongue swelling; has been intubated secondary to this reaction   . Lidocaine Anaphylaxis    Occurred 11/14 with right mastectomy  . Metoclopramide Hcl Shortness Of Breath, Itching, Swelling and Rash    Also tongue swelling   . Penicillins Shortness Of Breath, Itching, Swelling and Rash    Also tongue swelling   . Fentanyl Itching and Rash  . Hydromorphone Hcl Itching and Rash  . Iohexol Itching and Rash  . Ketorolac Tromethamine Itching and Rash  . Ondansetron Hcl Itching and Rash   . Prochlorperazine Edisylate Itching and Rash    Medications Prior to Admission  Medication Sig Dispense Refill  . albuterol (PROVENTIL HFA;VENTOLIN HFA) 108 (90 BASE) MCG/ACT inhaler Inhale 2 puffs into the lungs every 4 (four) hours as needed for shortness of breath. PROAIR    . ALPRAZolam (XANAX) 1 MG tablet Take 1 mg by mouth 3 (three) times daily.     Marland Kitchen amLODipine (NORVASC) 10 MG tablet Take 10 mg by mouth 2 (two) times daily.    Marland Kitchen aspirin EC 325 MG tablet Take 325 mg by mouth daily.    . clopidogrel (PLAVIX) 75 MG tablet Take 75 mg by mouth daily.    . furosemide (LASIX) 20 MG tablet Take 20 mg by mouth daily as needed for edema.     . gabapentin (NEURONTIN) 600 MG tablet Take 600 mg by mouth 3 (three) times daily.    Marland Kitchen HYDROcodone-acetaminophen (NORCO) 7.5-325 MG per tablet Take 1 tablet by mouth every 4 (four) hours as needed (breakthrough pain).   0  . hydroxyurea (HYDREA) 500 MG capsule Take 500 mg by mouth 2 (two) times daily. May take with food to minimize GI side effects.    Marland Kitchen lisinopril (PRINIVIL,ZESTRIL) 20 MG tablet Take 20 mg by mouth daily.    . mirtazapine (REMERON) 45 MG tablet Take 45 mg by mouth at bedtime.   3  . OLANZapine (ZYPREXA) 20 MG tablet Take 20 mg by mouth at bedtime.    . Oxycodone HCl 10 MG TABS Take 10 mg by mouth every 4 (four) hours as needed (pain).   0  . promethazine (PHENERGAN) 25 MG tablet Take 1 tablet (25 mg total) by mouth every 8 (eight) hours as needed for nausea. (Patient taking differently: Take 25 mg by mouth 2 (two) times daily. ) 15 tablet 0  . ranitidine (ZANTAC) 150 MG tablet Take 150 mg by mouth 3 (three) times daily.     Marland Kitchen tiZANidine (ZANAFLEX) 4 MG tablet Take 4 mg by mouth 3 (three) times daily.     Marland Kitchen venlafaxine XR (EFFEXOR-XR) 150 MG 24 hr capsule Take 150 mg by mouth at bedtime.   5  . zolpidem (AMBIEN) 10 MG tablet Take 10 mg by mouth at bedtime.     . nitroGLYCERIN (NITROSTAT) 0.4 MG SL tablet Place 0.4 mg under the tongue every  5 (five) minutes as needed for chest pain.      No results found for this or any previous visit (from the past 48 hour(s)). No results found.  Review of Systems  Constitutional: Negative.  HENT: Negative.   Eyes: Negative.   Gastrointestinal: Negative.   Musculoskeletal: Positive for back pain.  Skin: Negative.   Neurological: Positive for tingling.    Blood pressure 101/49, pulse 82, temperature 98.7 F (37.1 C), temperature source Oral, height 5\' 4"  (1.626 m), weight 95.255 kg (210 lb). Physical Exam  Constitutional: She is oriented to person, place, and time. She appears well-developed and well-nourished. No distress.  HENT:  Head: Normocephalic and atraumatic.  Right Ear: External ear normal.  Left Ear: External ear normal.  Nose: Nose normal.  Mouth/Throat: Oropharynx is clear and moist. No oropharyngeal exudate.  Eyes: Conjunctivae and EOM are normal. Pupils are equal, round, and reactive to light. Right eye exhibits no discharge. Left eye exhibits no discharge. No scleral icterus.  Neck: Normal range of motion. Neck supple. No JVD present. No tracheal deviation present. No thyromegaly present.  Cardiovascular: Normal rate, regular rhythm, normal heart sounds and intact distal pulses.  Exam reveals no gallop and no friction rub.   No murmur heard. Respiratory: Effort normal and breath sounds normal. No stridor. No respiratory distress. She has no wheezes. She has no rales. She exhibits no tenderness.  GI: Soft. Bowel sounds are normal. She exhibits no distension and no mass. There is no tenderness. There is no rebound and no guarding.  Musculoskeletal: She exhibits tenderness. She exhibits no edema.  Lymphadenopathy:    She has no cervical adenopathy.  Neurological: She is alert and oriented to person, place, and time. She displays abnormal reflex. No cranial nerve deficit. She exhibits abnormal muscle tone. Coordination normal.  Skin: Skin is warm and dry. No rash noted.  She is not diaphoretic. No erythema. No pallor.  Psychiatric: She has a normal mood and affect. Her behavior is normal. Judgment and thought content normal.    PHYSICAL EXAMINATION:  Her strength is normal in both lower extremities.  Her sciatic tension tests are negative. She is overweight, 210 pounds, at 5 feet 4 inches. She has discomfort with leg raise on both sides.  Reflexes, though, are symmetric and diminished at both the knee and ankle.  There is no clonus apparent.  Her complaints are primarily pain into her buttocks and thighs.    ASSESSMENT:  I have discussed with Makenly consideration of a further injection of her facet joints at the L5-S1 level.  She is not really excited about this idea at all.  With 5-mm anterior listhesis at the L5-S1 level, I think that one has to be concerned that the problem is spondylolisthesis at L5-S1 with pain associated with degenerative changes of the facet, as her pain pattern is primarily into her buttocks and hips.  She does have a history of sickle cell crisis and also describes a history of latex allergy and sleep apnea.  Some of the hip discomfort potentially could relate to sickle cell-type discomfort that can be intrinsic to the hip itself.  Her plain radiographs, though, do not show a great deal of degenerative changes within the hip joints themselves.  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  She has been through a physical therapy program.  She has difficulty with standing and ambulation any distance.  Pain pattern is consistent with painful spondylolisthesis.  We tried bracing her.  This does not seem to provide as much relief as she would like.     PLAN:  I discussed with Gema her situation.  I  think that the injection of her hip to rule out the hip as a source of pain has not really been of much benefit in helping [place] discern if the hip was a concern.  The radiographs of her left hip  demonstrated a well-maintained joint line with some minimal squaring off of the inferior aspect of the head-neck area.  Not a great deal of radiographic signs of abnormality here.  The slip she has at L5-S1 is subtle but it is significant in that it is 5 mm, which is an indication for an unstable segment at L5-S1.  With the degree of degenerative changes in her joints and vacuum signs, I am certain that the spondylosis in her facets is as much a problem as the degeneration of the disc at L5-S1 and mechanical problems associated with this, so at this point I think it is reasonable to consider a fusion for Tytiana as a way of trying to provide her with relief for her back pain and radiation into the left buttock.  The plan will be for a lumbar fusion at L5-S1.  Will use pedicle screws and rods and cage placed via the left side, as this is the side of her discomfort.  Will use Cell Saver and Jackson Spine Table.  Impact screws so that we would limit the exposure and use a DePuy cage, as the L5-S1 level should allow [place] and afford [place] adequate exposure of the posterolateral disc region to insert a large cage and restore lordosis here.  Risks of surgery including risks of infection, bleeding and neurologic compromise discussed with Olin Hauser.  Will go ahead and schedule her for this surgery.  Will need to be careful about hydration and oxygenation during her hospitalization to try and prevent her from developing any crisis associated with her sickle cell disease.  Yaakov Saindon E 10/26/2014, 7:27 AM

## 2014-10-26 NOTE — Progress Notes (Signed)
MEDICATION RELATED CONSULT NOTE - INITIAL   Pharmacy Consult for meperidine Indication: pain for sickle cell  Allergies  Allergen Reactions  . Latex Shortness Of Breath, Itching, Swelling and Rash    Also Tongue swelling; has been intubated secondary to this reaction   . Lidocaine Anaphylaxis    Occurred 11/14 with right mastectomy  . Metoclopramide Hcl Shortness Of Breath, Itching, Swelling and Rash    Also tongue swelling   . Penicillins Shortness Of Breath, Itching, Swelling and Rash    Also tongue swelling   . Fentanyl Itching and Rash  . Hydromorphone Hcl Itching and Rash  . Iohexol Itching and Rash  . Ketorolac Tromethamine Itching and Rash  . Ondansetron Hcl Itching and Rash  . Prochlorperazine Edisylate Itching and Rash    Patient Measurements: Height: 5\' 4"  (162.6 cm) Weight: 210 lb (95.255 kg) IBW/kg (Calculated) : 54.7 Adjusted Body Weight:   Vital Signs: Temp: 97.4 F (36.3 C) (06/28 1420) Temp Source: Oral (06/28 0600) BP: 112/71 mmHg (06/28 1645) Pulse Rate: 106 (06/28 1645) Intake/Output from previous day:   Intake/Output from this shift: Total I/O In: 2100 [I.V.:2100] Out: 1250 [Urine:1010; Blood:240]  Labs: No results for input(s): WBC, HGB, HCT, PLT, APTT, CREATININE, LABCREA, CREATININE, CREAT24HRUR, MG, PHOS, ALBUMIN, PROT, ALBUMIN, AST, ALT, ALKPHOS, BILITOT, BILIDIR, IBILI in the last 72 hours. Estimated Creatinine Clearance: 97.3 mL/min (by C-G formula based on Cr of 0.8).   Microbiology: Recent Results (from the past 720 hour(s))  Surgical pcr screen     Status: None   Collection Time: 10/20/14 10:20 AM  Result Value Ref Range Status   MRSA, PCR NEGATIVE NEGATIVE Final   Staphylococcus aureus NEGATIVE NEGATIVE Final    Comment:        The Xpert SA Assay (FDA approved for NASAL specimens in patients over 64 years of age), is one component of a comprehensive surveillance program.  Test performance has been validated by [facility]  for patients greater than or equal to 63 year old. It is not intended to diagnose infection nor to guide or monitor treatment.     Medical History: Past Medical History  Diagnosis Date  . Somatization disorder   . Depression   . Anxiety   . Beta thalassemia trait   . CVA (cerebral infarction)   . Pancreatitis   . Hypertension   . Coronary artery disease   . Stroke     years ago.  no deficits now  . Sleep apnea     tested more than 5-6 yrs ago.  Unable to get mask d/t lack of insurance at the time  . Shortness of breath dyspnea     states she does use inhaler, but has never been dx with asthma or bronchitis.  she does smoke however  . History of hiatal hernia   . Breast cancer     breast cancer on right--mastectomy and has implant  . Sickle cell anemia     Medications:  Scheduled:  . acetaminophen      . acetaminophen  1,000 mg Intravenous 4 times per day  . ALPRAZolam  1 mg Oral TID  . chlorhexidine  60 mL Topical Once  . chlorhexidine  60 mL Topical Once  . dexamethasone  10 mg Intravenous Once  . midazolam      . morphine      . morphine      . morphine      . morphine      . promethazine  Infusions:  . lactated ringers 50 mL/hr at 10/26/14 9432    Assessment: 49 yo female s/p surgery will be started on meperidine for pain; patient is allergic to few other narcotics.  CrCl 97.3 (SCr 0.8)  Goal of Therapy:  Resolution of pain  Plan:  - meperidine 12.5 mg iv q4h prn for severe pain - monitor vitals and signs of respiratory depression - f/u on pain in the morning to reassess pain regimen  Vandora Jaskulski, Tsz-Yin 10/26/2014,5:02 PM

## 2014-10-26 NOTE — Discharge Instructions (Signed)
° ° °  Call if there is increasing drainage, fever greater than 101.5, severe head aches, and worsening nausea or light sensitivity. If shortness of breath, bloody cough or chest tightness or pain go to an emergency room. No lifting greater than 10 lbs. Avoid bending, stooping and twisting. Use brace when sitting and out of bed even to go to bathroom. Walk in house for first 2 weeks then may start to get out slowly increasing distances up to one half mile by 4-6 weeks post op. After 5 days may shower and change dressing following bathing with shower.When bathing remove the brace shower and replace brace before getting out of the shower. If drainage, keep dry dressing and do not bathe the incision, use an moisture impervious dressing. Please call and return for scheduled follow up appointment 2 weeks from the time of surgery.

## 2014-10-26 NOTE — Progress Notes (Signed)
Nursing call to PACU. Patient complaining of numbness from left hip all the way down the leg. Has complaints of numbness left lateral and left anterior thigh and left medial thigh. Complains of numbness left entire foot. Exam of left leg shows that there is decreased sensation to pin prick left lateral calf and left lateral thigh but normal  Sensation of pin prick all areas of the left foot including the left great toe the dorsal interdigital space between the 1st and 2 nd toe over the dorsal lateral left foot and the plantar left foot. She demonstrates movement of the left foot dorsiflexors and plantar flexors and left knee extension and flexion.  Assessment: Dysesthesias probably secondary to nerve root retraction and likely post decompression  Swelling of the left L5 nerve root. Intraoperatively her nerve roots were evaluated and showed no further decompression. 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   Plan: Decadron 10 mg IV in the PACU. Xray of the L-S Spine shows pedicle screws and rods L5-S1 good position and alignment. Severe pain, history of sickle cell trait and pain. Oxycodone 10mg  every 4 hours prior to hospitalization. Multiple narcotic allergies, fentanyl, hydromorphone and ketorolac. Will request pharmacy consult to allow for  Demerol as she has multiple allergies and we will need demerol when morphine is of no benefit.

## 2014-10-26 NOTE — Progress Notes (Signed)
Foley removed and patient walked around half of the unit with back brace, walker, and one assist. She did very well. Pain control this evening has been difficult. Will continue to monitor. Karinne Schmader, Rande Brunt, RN

## 2014-10-26 NOTE — Transfer of Care (Signed)
Immediate Anesthesia Transfer of Care Note  Patient: [name]  Procedure(s) Performed: Procedure(s): LUMBAR FUSION L5-S1 WITH PEDICLE SCREWS, RODS, CAGE, VIVIGEN, ALLOGRAFT BONE GRAFT (N/A)  Patient Location: PACU  Anesthesia Type:General  Level of Consciousness: awake, alert , oriented and patient cooperative  Airway & Oxygen Therapy: Patient Spontanous Breathing and Patient connected to nasal cannula oxygen  Post-op Assessment: Report given to RN, Post -op Vital signs reviewed and stable and Patient moving all extremities X 4  Post vital signs: Reviewed and stable  Last Vitals:  Filed Vitals:   10/26/14 0600  BP: 101/49  Pulse: 82  Temp: 98.9 C    Complications: No apparent anesthesia complications

## 2014-10-27 ENCOUNTER — Encounter (HOSPITAL_COMMUNITY): Payer: Self-pay | Admitting: Specialist

## 2014-10-27 LAB — BASIC METABOLIC PANEL
ANION GAP: 10 (ref 5–15)
BUN: 9 mg/dL (ref 6–20)
CO2: 21 mmol/L — AB (ref 22–32)
CREATININE: 0.63 mg/dL (ref 0.44–1.00)
Calcium: 8.9 mg/dL (ref 8.9–10.3)
Chloride: 106 mmol/L (ref 101–111)
GFR calc Af Amer: >60 mL/min (ref >60)
GFR calc non Af Amer: >60 mL/min (ref >60)
Glucose, Bld: 138 mg/dL — ABNORMAL HIGH (ref 65–99)
Potassium: 4.5 mmol/L (ref 3.5–5.1)
Sodium: 137 mmol/L (ref 135–145)

## 2014-10-27 LAB — CBC
HCT: 30.2 % — ABNORMAL LOW (ref 36.0–46.0)
Hemoglobin: 10 g/dL — ABNORMAL LOW (ref 12.0–15.0)
MCH: 23.3 pg — ABNORMAL LOW (ref 26.0–34.0)
MCHC: 33.1 g/dL (ref 30.0–36.0)
MCV: 70.2 fL — ABNORMAL LOW (ref 78.0–100.0)
PLATELETS: 277 10*3/uL (ref 150–400)
RBC: 4.3 MIL/uL (ref 3.87–5.11)
RDW: 15.8 % — ABNORMAL HIGH (ref 11.5–15.5)
WBC: 9.1 10*3/uL (ref 4.0–10.5)

## 2014-10-27 NOTE — Care Management Note (Signed)
Case Management Note  Patient Details  Name: [name] MRN: [id number] Date of Birth: 09-18-1965  Subjective/Objective:                    Action/Plan: Met with patient to discuss discharge planning. Patient is requesting [facility] for PT services at discharge.  She has requested to have "[name]" as her therapist.  CM made [facility] aware of this request, and referral was faxed.  Awaiting return call regarding staffing availability for an anticipated discharge date of 10/29/14.  Patient states that she already has a walker at home, but will require the ordered 3n1.  CM will contact [facility] to have equipment delivered the day of discharge.  Patient is agreeable to the plan of care.    Expected Discharge Date:                  Expected Discharge Plan:  [place] (Patient lives at home alone. Has services through a private duty agency providing care daily)  In-House Referral:     Discharge planning Services  CM Consult  Post Acute Care Choice:  Durable Medical Equipment, Home Health Choice offered to:  Patient  DME Arranged:  3-N-1 DME Agency:  [place]:  PT [facility] Agency:  [facility]  Status of Service:  In process, will continue to follow  Medicare Important Message Given:    Date Medicare IM Given:    Medicare IM give by:    Date Additional Medicare IM Given:    Additional Medicare Important Message give by:     If discussed at [place] of Stay Meetings, dates discussed:    Additional Comments:  Rolm Baptise, RN 10/27/2014, 10:26 AM

## 2014-10-27 NOTE — Anesthesia Postprocedure Evaluation (Signed)
  Anesthesia Post-op Note  Patient: [name]  Procedure(s) Performed: Procedure(s): LUMBAR FUSION L5-S1 WITH PEDICLE SCREWS, RODS, CAGE, VIVIGEN, ALLOGRAFT BONE GRAFT (N/A)  Patient Location: PACU  Anesthesia Type:General  Level of Consciousness: awake, alert  and sedated  Airway and Oxygen Therapy: Patient Spontanous Breathing  Post-op Pain: mild  Post-op Assessment: Post-op Vital signs reviewed LLE Motor Response: Purposeful movement LLE Sensation: Numbness RLE Motor Response: Purposeful movement RLE Sensation: Full sensation      Post-op Vital Signs: stable  Last Vitals:  Filed Vitals:   10/27/14 1100  BP: 101/69  Pulse: 98  Temp: 36.4 C  Resp: 20    Complications: No apparent anesthesia complications

## 2014-10-27 NOTE — Progress Notes (Signed)
Utilization review completed.  

## 2014-10-27 NOTE — Progress Notes (Signed)
Orthopedic Tech Progress Note Patient Details:  [name] February 26, 1966 [id number]  Patient ID: Luther Redo, female   DOB: [birth year], 49 y.o.   MRN: [id number] Called in bio-tech brace order; spoke with Raul Del, Tashawnda Bleiler 10/27/2014, 9:56 AM

## 2014-10-27 NOTE — Clinical Social Work Note (Signed)
CSW Consult Acknowledged:   CSW received a consult for SNF placement. Per PT/OT's evaluation the appropriate level of care is home. CSW will sign off.   [place], MSW, [place]

## 2014-10-27 NOTE — Progress Notes (Signed)
OT EVALUATION    Clinical Impression Patient is s/p l5-s1 FUSION surgery resulting in functional limitations due to the deficits listed below (see OT problem list). PTA living at home alone, pt verbalized son would assist. Son present but asleep on couch entire session. Pt non compliant with back precautions this session.  Patient will benefit from skilled OT acutely to increase independence and safety with ADLS to allow discharge hhot .  md - please address back precautions with bed mobility and eob don/ doff of brace. Pt insist on breaking precautions with therapy. Please write order to don doff in standing if this is okay.      10/27/14 1000  OT Visit Information  Last OT Received On 10/27/14  Assistance Needed +1  History of Present Illness 49 yo female s/p LUMBAR FUSION L5-S1 WITH PEDICLE SCREWS, RODS, CAGE, VIVIGEN, ALLOGRAFT BONE GRAFT  FXT:[id number] disorder, depression, anxiety, CVA, HTN, CAD, Breast CA, sickle cell anemia   Precautions  Precautions Back  Precaution Comments back handout provided at end of session  Required Braces or Orthoses Spinal Brace  Spinal Brace Lumbar corset;Applied in sitting position  Home Living  Family/patient expects to be discharged to: Private residence  [place] at Discharge Family;Available PRN/intermittently  Type of Home Apartment  Home Access Level entry  Home Layout One level  Bathroom Shower/Tub Tub/shower unit  Automotive engineer None  Prior Function  Level of Independence Independent  Communication  Communication No difficulties  Pain Assessment  Pain Assessment 0-10  Pain Score 9  Pain Location back  Pain Intervention(s) Repositioned (Rn arriving with medication at end of session)  Cognition  Arousal/Alertness Awake/alert  Behavior During Therapy [name] for tasks assessed/performed  Overall Cognitive Status Impaired/Different from baseline  Area of Impairment  Safety/judgement;Awareness  Safety/Judgement Decreased awareness of safety  Awareness Anticipatory  General Comments Pt educated on back precautions and pt not following commands for safety.   Upper Extremity Assessment  Upper Extremity Assessment Overall WFL for tasks assessed  Lower Extremity Assessment  Lower Extremity Assessment Defer to PT evaluation  ADL  Overall ADL's  Needs assistance/impaired  Eating/Feeding Bed level;Independent  Grooming Wash/dry hands;Wash/dry face;Oral care;Min guard;Standing  Grooming Details (indicate cue type and reason) x1 lob with self correction  Upper Body Dressing  Sitting;Minimal assistance  Upper Body Dressing Details (indicate cue type and reason) pt educated to don brace in sitting. pt insisted on standing and fixing robe first and placing in standing. Pt again educated this is not the doctor orders currently  Lower Body Dressing Total assistance  Lower Body Dressing Details (indicate cue type and reason) unable to cross bil LE and don socks or doff  Toilet Transfer Minimal assistance;Ambulation;RW;BSC  Functional mobility during ADLs Minimal assistance;Rolling walker  General ADL Comments Pt with x2 lob during session both when bil UE unsupported for task.   Vision- History  Baseline Vision/History Wears glasses  Wears Glasses At all times  Bed Mobility  Overal bed mobility Needs Assistance  Bed Mobility Supine to Sit  Supine to sit Min assist;HOB elevated  General bed mobility comments cues for safety and pt currently on side on arrival. pt noted to have chips in the bed and states "I have a medication that makes me eat in my sleep"  Transfers  Overall transfer level Needs assistance  Equipment used Rolling walker (2 wheeled)  Transfers Sit to/from Stand  Sit to Stand Min assist  General transfer comment cues  for hand placement . pt standing impulsively with max cues to don brace first  General Comments  General comments (skin integrity,  edema, etc.) pt reports x3 falls PTA  OT - End of Session  Equipment Utilized During Treatment Gait belt;Rolling walker;Back brace  Activity Tolerance Patient tolerated treatment well  Patient left in chair;with call bell/phone within reach;Other (comment) (PT logan arriving)  Nurse Communication Mobility status;Precautions  OT Assessment  OT Therapy Diagnosis  Generalized weakness;Acute pain  OT Recommendation/Assessment Patient needs continued OT Services  OT Problem List Decreased strength;Decreased activity tolerance;Impaired balance (sitting and/or standing);Decreased cognition;Decreased safety awareness;Decreased knowledge of use of DME or AE;Obesity;Pain  Barriers to Discharge Decreased caregiver support  OT Plan  OT Frequency (ACUTE ONLY) Min 3X/week  OT Treatment/Interventions (ACUTE ONLY) Self-care/ADL training;Therapeutic exercise;DME and/or AE instruction;Therapeutic activities;Cognitive remediation/compensation;Patient/family education;Balance training  OT Recommendation  Follow Up Recommendations Home health OT  OT Equipment (tba)  Individuals Consulted  Consulted and Agree with Results and Recommendations Patient  Acute Rehab OT Goals  Patient Stated Goal to go home with son helping  OT Goal Formulation With patient  Time For Goal Achievement 11/10/14  Potential to Achieve Goals Good  OT Time Calculation  OT Start Time (ACUTE ONLY) 0917  OT Stop Time (ACUTE ONLY) 0941  OT Time Calculation (min) 24 min  OT General Charges  $OT Visit 1 Procedure  OT Evaluation  $Initial OT Evaluation Tier I 1 Procedure  OT Treatments  $Self Care/Home Management  8-22 mins  Written Expression  Dominant Hand Right   [name]   OTR/L Pager: [phone] Office: [phone] .

## 2014-10-27 NOTE — Progress Notes (Signed)
Patient ID: [name], female   DOB: Aug 30, 1965, 49 y.o.   MRN: [id number]     Subjective: 1 Day Post-Op Procedure(s) (LRB): LUMBAR FUSION L5-S1 WITH PEDICLE SCREWS, RODS, CAGE, VIVIGEN, ALLOGRAFT BONE GRAFT (N/A) Awake, alert and oriented x 4. Moderate pain this AM. "They got it under control."  Patient reports pain as moderate.    Objective:   VITALS:  Temp:  [97.4 F (36.3 C)-99.2 F (37.3 C)] 98.4 F (36.9 C) (06/29 0236) Pulse Rate:  [33-121] 106 (06/29 0236) Resp:  [12-26] 20 (06/29 0236) BP: (92-153)/(56-92) 116/65 mmHg (06/29 0236) SpO2:  [92 %-100 %] 95 % (06/29 0236)  Neurologically intact ABD soft Neurovascular intact Sensation intact distally Intact pulses distally Dorsiflexion/Plantar flexion intact Incision: no drainage Numbess left leg is gone. Will continue with a medrol dose pak.  LABS  Recent Labs  10/26/14 1722 10/27/14 0620  HGB 11.3* 10.0*  WBC 10.3 PENDING  PLT 286 PENDING    Recent Labs  10/27/14 0620  NA 137  K 4.5  CL 106  CO2 21*  BUN 9  CREATININE 0.63  GLUCOSE 138*   No results for input(s): LABPT, INR in the last 72 hours.   Assessment/Plan: 1 Day Post-Op Procedure(s) (LRB): LUMBAR FUSION L5-S1 WITH PEDICLE SCREWS, RODS, CAGE, VIVIGEN, ALLOGRAFT BONE GRAFT (N/A) Periop acute blood loss anemia,  Very mild Hgb 11 preop decreased to 10 today, 240cc EBL. 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   Advance diet Up with therapy Desires a regular diet. Needs IS for bedside. PT/OT today.  Draden Cottingham E 10/27/2014, 8:18 AM

## 2014-10-27 NOTE — Evaluation (Signed)
Physical Therapy Evaluation Patient Details Name: [name] MRN: [id number] DOB: 06-20-1965 Today's Date: 10/27/2014   History of Present Illness  49 yo female s/p LUMBAR FUSION L5-S1 WITH PEDICLE SCREWS, RODS, CAGE, VIVIGEN, ALLOGRAFT BONE GRAFT  ONG:[id number] disorder, depression, anxiety, CVA, HTN, CAD, Breast CA, sickle cell anemia   Clinical Impression  Patient is s/p above procedure, presenting with functional limitations due to the deficits listed below (see PT Problem List). Ambulates slowly up to 100 feet at a min guard level for safety using RW for support. Reports having 3 falls in the past 6 months. Plans to have son assist at home as needed. Pt needs frequent reinforcement to maintain back precautions throughout therapy session. Would benefit from HHPT for short period of time due to poor safety/judgement. Patient will benefit from skilled PT to increase their independence and safety with mobility to allow discharge to the venue listed below.      Follow Up Recommendations Home health PT;Supervision - Intermittent    Equipment Recommendations  None recommended by PT    Recommendations for Other Services       Precautions / Restrictions Precautions Precautions: Back Precaution Booklet Issued: Yes (comment) Precaution Comments: Reviewed handout Required Braces or Orthoses: Spinal Brace Spinal Brace: Lumbar corset;Applied in sitting position Restrictions Weight Bearing Restrictions: No      Mobility  Bed Mobility Overal bed mobility: Needs Assistance Bed Mobility: Sit to Sidelying     Supine to sit: Min assist;HOB elevated   Sit to sidelying: Min guard General bed mobility comments: Max cues throughout this task for safety and to maintain back precautions. No physical assist required. Min guard for safety.  Transfers Overall transfer level: Needs assistance Equipment used: Rolling walker (2 wheeled) Transfers: Sit to/from Stand Sit to Stand: Min  guard         General transfer comment: Close guard for safety. Slow to rise demonstrating LE weakness and much guarding due to back pain. VC for technique and hand placement.  Ambulation/Gait Ambulation/Gait assistance: Min guard Ambulation Distance (Feet): 100 Feet Assistive device: Rolling walker (2 wheeled) Gait Pattern/deviations: Step-through pattern;Decreased stride length;Shuffle;Trunk flexed Gait velocity: slow Gait velocity interpretation: Below normal speed for age/gender General Gait Details: Slow and guarded. Close guard for safety. No LE buckling noted however shuffling feet at times Rt>Lt requiring cues for awareness and correction. VC additionally provided for walker placement and to maintain back precautions with turns.  Stairs            Wheelchair Mobility    Modified Rankin (Stroke Patients Only)       Balance Overall balance assessment: Needs assistance;History of Falls (3 falls in past 6 months) Sitting-balance support: No upper extremity supported;Feet supported Sitting balance-Leahy Scale: Fair     Standing balance support: Single extremity supported Standing balance-Leahy Scale: Poor                               Pertinent Vitals/Pain Pain Assessment: 0-10 Pain Score: 9  Pain Location: back Pain Descriptors / Indicators: Sore Pain Intervention(s): Repositioned;Monitored during session;Patient requesting pain meds-RN notified;Limited activity within patient's tolerance    Home Living Family/patient expects to be discharged to:: Private residence Living Arrangements: Alone Available Help at Discharge: Family;Available PRN/intermittently (son) Type of Home: Apartment Home Access: Level entry     Home Layout: One level Home Equipment: Walker - 2 wheels      Prior Function Level of Independence:  Independent               Hand Dominance   Dominant Hand: Right    Extremity/Trunk Assessment   Upper Extremity  Assessment: Defer to OT evaluation           Lower Extremity Assessment: Generalized weakness         Communication   Communication: No difficulties  Cognition Arousal/Alertness: Awake/alert Behavior During Therapy: WFL for tasks assessed/performed Overall Cognitive Status: Impaired/Different from baseline Area of Impairment: Safety/judgement;Awareness         Safety/Judgement: Decreased awareness of safety Awareness: Anticipatory   General Comments: Pt educated on back precautions and pt not following commands for safety despite instructions to correct back alignment.    General Comments General comments (skin integrity, edema, etc.): Pt frequently reminded of precautions throughout therapy session. Poor insight and understanding despite education and reinforcement.    Exercises        Assessment/Plan    PT Assessment Patient needs continued PT services  PT Diagnosis Difficulty walking;Abnormality of gait;Generalized weakness;Acute pain   PT Problem List Decreased strength;Decreased activity tolerance;Decreased balance;Decreased mobility;Decreased range of motion;Decreased cognition;Decreased knowledge of use of DME;Decreased safety awareness;Decreased knowledge of precautions;Pain  PT Treatment Interventions DME instruction;Gait training;Functional mobility training;Therapeutic activities;Therapeutic exercise;Balance training;Neuromuscular re-education;Cognitive remediation;Patient/family education;Modalities   PT Goals (Current goals can be found in the Care Plan section) Acute Rehab PT Goals Patient Stated Goal: to go home with son helping PT Goal Formulation: With patient Time For Goal Achievement: 11/10/14 Potential to Achieve Goals: Good    Frequency Min 5X/week   Barriers to discharge Decreased caregiver support Told OT she lives alone, told PT she would have 24 hour supervision.    Co-evaluation               End of Session Equipment Utilized  During Treatment: Gait belt;Back brace Activity Tolerance: Patient tolerated treatment well Patient left: in bed;with call bell/phone within reach;with nursing/sitter in room Nurse Communication: Mobility status         Time: 3149-7026 PT Time Calculation (min) (ACUTE ONLY): 12 min   Charges:   PT Evaluation $Initial PT Evaluation Tier I: 1 Procedure     PT G CodesEllouise [name] 10/27/2014, 12:31 PM [name] [place], [place]

## 2014-10-28 ENCOUNTER — Inpatient Hospital Stay (HOSPITAL_COMMUNITY): Payer: Medicare Other

## 2014-10-28 ENCOUNTER — Encounter (HOSPITAL_COMMUNITY): Payer: Self-pay | Admitting: Specialist

## 2014-10-28 LAB — GLUCOSE, CAPILLARY: GLUCOSE-CAPILLARY: 243 mg/dL — AB (ref 65–99)

## 2014-10-28 MED FILL — Sodium Chloride IV Soln 0.9%: INTRAVENOUS | Qty: 1000 | Status: AC

## 2014-10-28 MED FILL — Heparin Sodium (Porcine) Inj 1000 Unit/ML: INTRAMUSCULAR | Qty: 30 | Status: AC

## 2014-10-28 NOTE — Progress Notes (Addendum)
Subjective: Doing well.  Pain controlled with percocet and morphine.  no adverse reaction.    Objective: Vital signs in last 24 hours: Temp:  [97.5 F (36.4 C)-98.7 F (37.1 C)] 98.7 F (37.1 C) (06/30 0124) Pulse Rate:  [72-114] 114 (06/30 0524) Resp:  [20-22] 20 (06/30 0524) BP: (78-112)/(39-69) 99/59 mmHg (06/30 0524) SpO2:  [95 %-100 %] 96 % (06/30 0524)  Intake/Output from previous day: 06/29 0701 - 06/30 0700 In: 240 [P.O.:240] Out: -  Intake/Output this shift:     Recent Labs  10/26/14 1722 10/27/14 0620  HGB 11.3* 10.0*    Recent Labs  10/26/14 1722 10/27/14 0620  WBC 10.3 9.1  RBC 4.77 4.30  HCT 33.9* 30.2*  PLT 286 277    Recent Labs  10/27/14 0620  NA 137  K 4.5  CL 106  CO2 21*  BUN 9  CREATININE 0.63  GLUCOSE 138*  CALCIUM 8.9   No results for input(s): LABPT, INR in the last 72 hours.  Exam:  Wound looks good.  No drainage or signs of infection. bilat calves nontender.  Trace right ant tib weakness.    Assessment/Plan: Question d/c home today or tomorrow.  Continue PT.  Dressing change.    OWENS,Rozena Fierro M 10/28/2014, 8:43 AM   Patient examined and lab reviewed with Ricard Dillon, PA-C.

## 2014-10-28 NOTE — Progress Notes (Signed)
Physical Therapy Treatment Patient Details Name: [name] MRN: [id number] DOB: 29-Apr-1966 Today's Date: 10/28/2014    History of Present Illness 49 yo female s/p LUMBAR FUSION L5-S1 WITH PEDICLE SCREWS, RODS, CAGE, VIVIGEN, ALLOGRAFT BONE GRAFT  GBT:[id number] disorder, depression, anxiety, CVA, HTN, CAD, Breast CA, sickle cell anemia     PT Comments    Slow progress today. Pt still with difficulty recalling any of the back precautions she has been educated on. Reviewed handout and discussed these back precautions and use of new back brace application. Min assist for bed mobility (states her son will assist in/out of bed at home.) During ambulatory bout, pt began to complain of LLE weakness requiring min assist for stability. Reports pain traveling to anterior knee but no overt buckling noted during therapy session. Will continue to follow and progress functional independence and safety. Recommend additional therapy session prior to d/c.  Follow Up Recommendations  Home health PT;Supervision - Intermittent     Equipment Recommendations  None recommended by PT    Recommendations for Other Services       Precautions / Restrictions Precautions Precautions: Back Precaution Comments: Reviewed recalls 0/3 Required Braces or Orthoses: Spinal Brace Spinal Brace: Lumbar corset;Applied in sitting position Restrictions Weight Bearing Restrictions: No    Mobility  Bed Mobility Overal bed mobility: Needs Assistance Bed Mobility: Rolling;Sidelying to Sit Rolling: Min assist Sidelying to sit: Min assist       General bed mobility comments: Max VC for technique. Pt unable to perform log roll on her own. Became frustrated. States her son will assist her in/out of bed every morning/evening. Provided min assist for LE support out of bed, and truncal support to rise to seated position.  Transfers Overall transfer level: Needs assistance Equipment used: Rolling walker (2  wheeled) Transfers: Sit to/from Stand Sit to Stand: Min guard         General transfer comment: Close guard for safety. performed from low bed setting, recliner, and toilet using grab bars. VC to maintain precautions, and hand placement.  Ambulation/Gait Ambulation/Gait assistance: Min assist Ambulation Distance (Feet): 70 Feet Assistive device: Rolling walker (2 wheeled) Gait Pattern/deviations: Step-through pattern;Decreased step length - right;Decreased stance time - left;Decreased stride length;Shuffle;Antalgic Gait velocity: slow Gait velocity interpretation: Below normal speed for age/gender General Gait Details: Still slow and guarded. At halfway point pt reports increased pain in LLE and fear of LLE buckling. Although knee appeared slightly unsteady no significant buckling was noted. Encouraged to maintain upright posture with RW under UEs for support as needed. Required several standing rest breaks to complete this distance today.   Stairs            Wheelchair Mobility    Modified Rankin (Stroke Patients Only)       Balance                                    Cognition Arousal/Alertness: Lethargic Behavior During Therapy: WFL for tasks assessed/performed Overall Cognitive Status: Impaired/Different from baseline Area of Impairment: Safety/judgement;Awareness;Memory;Orientation Orientation Level: Time;Disoriented to;Situation (Initially disoriented. Improved after becoming more alert)   Memory: Decreased recall of precautions   Safety/Judgement: Decreased awareness of safety Awareness: Anticipatory   General Comments: Still with difficulty following commands. unable to recall precautions. pt confused with PT entered room, disoriented to time situation but improved after becoming more alert and working with therapist.    Exercises  General Comments General comments (skin integrity, edema, etc.): Lt knee instability without overt buckling  today. Reports pain extending into Lt anterior thigh to knee. Educated on how to donne new brace ordered .      Pertinent Vitals/Pain Pain Assessment: 0-10 Pain Score: 10-Worst pain ever Pain Location: back, LLE Pain Intervention(s): Monitored during session;Repositioned    Home Living                      Prior Function            PT Goals (current goals can now be found in the care plan section) Acute Rehab PT Goals Patient Stated Goal: to go home with son helping PT Goal Formulation: With patient Time For Goal Achievement: 11/10/14 Potential to Achieve Goals: Good Progress towards PT goals: Progressing toward goals    Frequency  Min 5X/week    PT Plan Current plan remains appropriate    Co-evaluation             End of Session Equipment Utilized During Treatment: Gait belt;Back brace Activity Tolerance: Patient tolerated treatment well Patient left: with call bell/phone within reach;in chair     Time: 4585-9292 PT Time Calculation (min) (ACUTE ONLY): 23 min  Charges:  $Gait Training: 8-22 mins $Therapeutic Activity: 8-22 mins                    G CodesEllouise Newer 11-03-14, 2:28 PM Camille [place], [place]

## 2014-10-28 NOTE — Care Management (Signed)
Important Message  Patient Details  Name: [name] MRN: [id number] Date of Birth: March 11, 1966   Medicare Important Message Given:  Yes-second notification given    Delorse Lek 10/28/2014, 10:23 AM

## 2014-10-28 NOTE — Progress Notes (Signed)
IV team was requested to assess the pt's TL CVC in the left IJ. Arrived to find the dressing was more than 50% off of the insertion site. Pt was sleeping and there was saliva that saturated the dressing, the dressing also had brown exudate that appeared to be old blood and the skin was very moist and peeling. There was not a biopatch covering the insertion site. CVC was removed, skin was cleaned with an alcohol pad and dried with a steril gauze. A Vaseline pressure gauze was placed over the insertion site. [name]

## 2014-10-29 ENCOUNTER — Encounter (HOSPITAL_COMMUNITY): Payer: Self-pay | Admitting: Specialist

## 2014-10-29 MED ORDER — OXYCODONE HCL 15 MG PO TABS: 15.0000 mg | ORAL_TABLET | ORAL | Status: DC | PRN

## 2014-10-29 MED ORDER — OXYCODONE HCL 5 MG PO TABS
15.0000 mg | ORAL_TABLET | ORAL | Status: DC | PRN
  Filled 2014-10-29: qty 3

## 2014-10-29 MED ORDER — METHOCARBAMOL 500 MG PO TABS: 500.0000 mg | ORAL_TABLET | Freq: Four times a day (QID) | ORAL | Status: DC | PRN

## 2014-10-29 MED ORDER — PROMETHAZINE HCL 25 MG PO TABS: 25.0000 mg | ORAL_TABLET | Freq: Three times a day (TID) | ORAL | Status: AC | PRN

## 2014-10-29 MED ORDER — ASPIRIN 325 MG PO TBEC: 325.0000 mg | DELAYED_RELEASE_TABLET | Freq: Every day | ORAL | Status: AC

## 2014-10-29 MED ORDER — ZOLPIDEM TARTRATE 5 MG PO TABS: 5.0000 mg | ORAL_TABLET | Freq: Every day | ORAL | Status: DC

## 2014-10-29 NOTE — Progress Notes (Signed)
     Subjective: 3 Days Post-Op Procedure(s) (LRB): LUMBAR FUSION L5-S1 WITH PEDICLE SCREWS, RODS, CAGE, VIVIGEN, ALLOGRAFT BONE GRAFT (N/A) Awake, alert and oriented x 4. Temp 99, needs to use IS. Smoker on nicotine patch. Patient reports pain as moderate.    Objective:   VITALS:  Temp:  [98.1 F (36.7 C)-99.5 F (37.5 C)] 99.1 F (37.3 C) (07/01 0545) Pulse Rate:  [94-118] 101 (07/01 0545) Resp:  [20] 20 (07/01 0545) BP: (94-149)/(43-121) 106/59 mmHg (07/01 0545) SpO2:  [96 %-100 %] 100 % (07/01 0545)  Neurologically intact ABD soft Neurovascular intact Sensation intact distally Intact pulses distally Dorsiflexion/Plantar flexion intact Incision: scant drainage No cellulitis present Dressing changed, she is alert and oriented.    LABS  Recent Labs  10/26/14 1722 10/27/14 0620  HGB 11.3* 10.0*  WBC 10.3 9.1  PLT 286 277    Recent Labs  10/27/14 0620  NA 137  K 4.5  CL 106  CO2 21*  BUN 9  CREATININE 0.63  GLUCOSE 138*   No results for input(s): LABPT, INR in the last 72 hours.   Assessment/Plan: 3 Days Post-Op Procedure(s) (LRB): LUMBAR FUSION L5-S1 WITH PEDICLE SCREWS, RODS, CAGE, VIVIGEN, ALLOGRAFT BONE GRAFT (N/A)  Up with therapy Discharge home with home health  Will need transport to home. May start showering tomorrow. Paint incision with betadiene daily. Oxycodone without tylenol 15 mg every 4-6 hours for pain. Stool softener.  NITKA,JAMES E 10/29/2014, 7:31 AM

## 2014-10-29 NOTE — Progress Notes (Signed)
Patient being discharged home with 5 prescriptions, vital signs stable with slight temp elevation Md not concerned, son will transport patient from hospital home and remain with her for next 48-72 hours, paitint with some pain but controlled by medication.

## 2014-10-29 NOTE — Care Management Note (Signed)
Case Management Note  Patient Details  Name: [name] MRN: [id number] Date of Birth: [birth year]  Subjective/Objective:                    Action/Plan: CM spoke with [facility] to notify of patient's discharge.  Discharge summary and AVS were faxed.  [facility] was notified of need for equipment prior to discharge home today.  CSW has set up Medicaid transportation.  Patient was updated.  Expected Discharge Date:                  Expected Discharge Plan:  [place] (Patient lives at home alone. Has services through a private duty agency providing care daily)  In-House Referral:     Discharge planning Services  CM Consult  Post Acute Care Choice:  Durable Medical Equipment, Home Health Choice offered to:  Patient  DME Arranged:  3-N-1 DME Agency:  [place]:  PT [facility] Agency:  [facility]  Status of Service:  In process, will continue to follow  Medicare Important Message Given:  Yes-second notification given Date Medicare IM Given:    Medicare IM give by:    Date Additional Medicare IM Given:    Additional Medicare Important Message give by:     If discussed at [facility] of Stay Meetings, dates discussed:    Additional Comments:  Rolm Baptise, RN 10/29/2014, 10:08 AM

## 2014-11-10 NOTE — Discharge Summary (Signed)
Patient ID: [name] MRN: [id number] DOB/AGE: Apr 26, 1966 49 y.o.  Admit date: 10/26/2014 Discharge date: 11/10/2014  Admission Diagnoses:  Principal Problem:   Spondylolisthesis of lumbar region   Discharge Diagnoses:  Principal Problem:   Spondylolisthesis of lumbar region  status post Procedure(s): LUMBAR FUSION L5-S1 WITH PEDICLE SCREWS, RODS, CAGE, VIVIGEN, ALLOGRAFT BONE GRAFT  Past Medical History  Diagnosis Date  . Somatization disorder   . Depression   . Anxiety   . Beta thalassemia trait   . CVA (cerebral infarction)   . Pancreatitis   . Hypertension   . Coronary artery disease   . Stroke     years ago.  no deficits now  . Sleep apnea     tested more than 5-6 yrs ago.  Unable to get mask d/t lack of insurance at the time  . Shortness of breath dyspnea     states she does use inhaler, but has never been dx with asthma or bronchitis.  she does smoke however  . History of hiatal hernia   . Breast cancer     breast cancer on right--mastectomy and has implant  . Sickle cell anemia     Surgeries: Procedure(s): LUMBAR FUSION L5-S1 WITH PEDICLE SCREWS, RODS, CAGE, VIVIGEN, ALLOGRAFT BONE GRAFT on 10/26/2014   Consultants:    Discharged Condition: Improved  Hospital Course: [name] is an 49 y.o. female who was admitted 10/26/2014 for operative treatment of Spondylolisthesis of lumbar region. Patient failed conservative treatments (please see the history and physical for the specifics) and had severe unremitting pain that affects sleep, daily activities and work/hobbies. After pre-op clearance, the patient was taken to the operating room on 10/26/2014 and underwent  Procedure(s): LUMBAR FUSION L5-S1 WITH PEDICLE SCREWS, RODS, CAGE, VIVIGEN, ALLOGRAFT BONE GRAFT.    Patient was given perioperative antibiotics:  Anti-infectives    Start     Dose/Rate Route Frequency Ordered Stop   10/26/14 1930  vancomycin (VANCOCIN) IVPB 1000 mg/200 mL premix     1,000 mg 200 mL/hr over 60 Minutes Intravenous  Once 10/26/14 1756 10/27/14 0224   10/26/14 0630  vancomycin (VANCOCIN) IVPB 1000 mg/200 mL premix     1,000 mg 200 mL/hr over 60 Minutes Intravenous To [facility] 10/25/14 1408 10/26/14 0730       Patient was given sequential compression devices and early ambulation to prevent DVT.   Patient benefited maximally from hospital stay and there were no complications. At the time of discharge, the patient was urinating/moving their bowels without difficulty, tolerating a regular diet, pain is controlled with oral pain medications and they have been cleared by PT/OT.   Recent vital signs: No data found.    Recent laboratory studies: No results for input(s): WBC, HGB, HCT, PLT, NA, K, CL, CO2, BUN, CREATININE, GLUCOSE, INR, CALCIUM in the last 72 hours.  Invalid input(s): PT, 2   Discharge Medications:     Medication List    TAKE these medications        albuterol 108 (90 BASE) MCG/ACT inhaler  Commonly known as:  PROVENTIL HFA;VENTOLIN HFA  Inhale 2 puffs into the lungs every 4 (four) hours as needed for shortness of breath. PROAIR     ALPRAZolam 1 MG tablet  Commonly known as:  XANAX  Take 1 mg by mouth 3 (three) times daily.     amLODipine 10 MG tablet  Commonly known as:  NORVASC  Take 10 mg by mouth 2 (two) times daily.  aspirin EC 325 MG tablet  Take 325 mg by mouth daily.     aspirin 325 MG EC tablet  Take 1 tablet (325 mg total) by mouth daily.     clopidogrel 75 MG tablet  Commonly known as:  PLAVIX  Take 75 mg by mouth daily.     furosemide 20 MG tablet  Commonly known as:  LASIX  Take 20 mg by mouth daily as needed for edema.     gabapentin 600 MG tablet  Commonly known as:  NEURONTIN  Take 600 mg by mouth 3 (three) times daily.     HYDROcodone-acetaminophen 7.5-325 MG per tablet  Commonly known as:  NORCO  Take 1 tablet by mouth every 4 (four) hours as needed (breakthrough pain).      hydroxyurea 500 MG capsule  Commonly known as:  HYDREA  Take 500 mg by mouth 2 (two) times daily. May take with food to minimize GI side effects.     lisinopril 20 MG tablet  Commonly known as:  PRINIVIL,ZESTRIL  Take 20 mg by mouth daily.     methocarbamol 500 MG tablet  Commonly known as:  ROBAXIN  Take 1 tablet (500 mg total) by mouth every 6 (six) hours as needed for muscle spasms.     mirtazapine 45 MG tablet  Commonly known as:  REMERON  Take 45 mg by mouth at bedtime.     nitroGLYCERIN 0.4 MG SL tablet  Commonly known as:  NITROSTAT  Place 0.4 mg under the tongue every 5 (five) minutes as needed for chest pain.     OLANZapine 20 MG tablet  Commonly known as:  ZYPREXA  Take 20 mg by mouth at bedtime.     oxyCODONE 15 MG immediate release tablet  Commonly known as:  ROXICODONE  Take 1 tablet (15 mg total) by mouth every 4 (four) hours as needed for severe pain.     promethazine 25 MG tablet  Commonly known as:  PHENERGAN  Take 1 tablet (25 mg total) by mouth every 8 (eight) hours as needed for nausea.     promethazine 25 MG tablet  Commonly known as:  PHENERGAN  Take 1 tablet (25 mg total) by mouth every 8 (eight) hours as needed for nausea.     ranitidine 150 MG tablet  Commonly known as:  ZANTAC  Take 150 mg by mouth 3 (three) times daily.     tiZANidine 4 MG tablet  Commonly known as:  ZANAFLEX  Take 4 mg by mouth 3 (three) times daily.     venlafaxine XR 150 MG 24 hr capsule  Commonly known as:  EFFEXOR-XR  Take 150 mg by mouth at bedtime.     zolpidem 5 MG tablet  Commonly known as:  AMBIEN  Take 1 tablet (5 mg total) by mouth at bedtime.     zolpidem 10 MG tablet  Commonly known as:  AMBIEN  Take 10 mg by mouth at bedtime.        Diagnostic Studies: Dg Lumbar Spine Complete  10/26/2014   CLINICAL DATA:  Lumbar fusion.  EXAM: DG C-ARM 61-120 MIN; LUMBAR SPINE - COMPLETE 4+ VIEW  COMPARISON:  Postmyelogram CT scan 09/24/2014.  FINDINGS: Four  fluoroscopic intraoperative spot views lumbar spine demonstrate pedicle screws and stabilization bars in place at L5-S1. Hardware is intact. No acute abnormality.  IMPRESSION: L5-S1 fusion.   Electronically Signed   By: Inge Rise M.D.   On: 10/26/2014 14:09   Dg Lumbar Spine  1 View  10/26/2014   CLINICAL DATA:  Left leg pain after surgery.  EXAM: LUMBAR SPINE - 1 VIEW  COMPARISON:  Same day.  Sep 24, 2014.  FINDINGS: Status post surgical posterior fusion of L5-S1 with bilateral intrapedicular screw placement. Spondylolisthesis cannot be assessed as no lateral projection was obtained.  IMPRESSION: Postsurgical changes as described above. No other significant abnormality seen.   Electronically Signed   By: Marijo Conception, M.D.   On: 10/26/2014 16:22   Dg Chest Port 1 View  10/26/2014   CLINICAL DATA:  Postop, pneumothorax  EXAM: PORTABLE CHEST - 1 VIEW  COMPARISON:  04/27/2013  FINDINGS: Cardiomediastinal silhouette is stable. Right IJ Port-A-Cath with tip in SVC is unchanged in position. There is no acute infiltrate or pulmonary edema. There is a left IJ central line with tip in left innominate vein. There is no pneumothorax.  IMPRESSION: Right IJ Port-A-Cath is unchanged in position. Left IJ central line with tip in left innominate vein. No pneumothorax.   Electronically Signed   By: Lahoma Crocker M.D.   On: 10/26/2014 15:16   Dg C-arm 1-60 Min  10/26/2014   CLINICAL DATA:  Lumbar fusion.  EXAM: DG C-ARM 61-120 MIN; LUMBAR SPINE - COMPLETE 4+ VIEW  COMPARISON:  Postmyelogram CT scan 09/24/2014.  FINDINGS: Four fluoroscopic intraoperative spot views lumbar spine demonstrate pedicle screws and stabilization bars in place at L5-S1. Hardware is intact. No acute abnormality.  IMPRESSION: L5-S1 fusion.   Electronically Signed   By: Inge Rise M.D.   On: 10/26/2014 14:09        Discharge Instructions    Call MD / Call 911    Complete by:  As directed   If you experience chest pain or shortness of  breath, CALL 911 and be transported to the hospital emergency room.  If you develope a fever above 101 F, pus (white drainage) or increased drainage or redness at the wound, or calf pain, call your surgeon's office.     Constipation Prevention    Complete by:  As directed   Drink plenty of fluids.  Prune juice may be helpful.  You may use a stool softener, such as Colace (over the counter) 100 mg twice a day.  Use MiraLax (over the counter) for constipation as needed.     Diet - low sodium heart healthy    Complete by:  As directed      Discharge instructions    Complete by:  As directed   Call if there is increasing drainage, fever greater than 101.5, severe head aches, and worsening nausea or light sensitivity. If shortness of breath, bloody cough or chest tightness or pain go to an emergency room. No lifting greater than 10 lbs. Avoid bending, stooping and twisting. Use brace when sitting and out of bed even to go to bathroom. Walk in house for first 2 weeks then may start to get out slowly increasing distances up to one half mile by 4-6 weeks post op. After 5 days may shower and change dressing following bathing with shower.When bathing remove the brace shower and replace brace before getting out of the shower. Keep dry dressing and may bathe the incision, use an moisture impervious dressing. Paint incision daily with betadiene swab. Please call and return for scheduled follow up appointment 2 weeks from the time of surgery.     Driving restrictions    Complete by:  As directed   No driving for 4 weeks  Increase activity slowly as tolerated    Complete by:  As directed      Lifting restrictions    Complete by:  As directed   No lifting for 8 weeks           Follow-up Information    Follow up with NITKA,Audre Cenci E, MD In 2 weeks.   Specialty:  Orthopedic Surgery   Contact information:   [address] [place] [address] [phone]       Discharge Plan:   discharge to home  Disposition:     Signed: Lanae Crumbly  11/10/2014, 8:08 AM

## 2015-01-10 ENCOUNTER — Encounter: Payer: Self-pay | Admitting: Neurology

## 2015-01-10 ENCOUNTER — Ambulatory Visit (INDEPENDENT_AMBULATORY_CARE_PROVIDER_SITE_OTHER): Payer: Medicare Other | Admitting: Neurology

## 2015-01-10 VITALS — BP 109/77 | HR 84 | Ht 64.0 in | Wt 212.5 lb

## 2015-01-10 DIAGNOSIS — G959 Disease of spinal cord, unspecified: Secondary | ICD-10-CM | POA: Diagnosis not present

## 2015-01-10 DIAGNOSIS — R270 Ataxia, unspecified: Secondary | ICD-10-CM | POA: Diagnosis not present

## 2015-01-10 DIAGNOSIS — W19XXXA Unspecified fall, initial encounter: Secondary | ICD-10-CM

## 2015-01-10 DIAGNOSIS — R200 Anesthesia of skin: Secondary | ICD-10-CM

## 2015-01-10 DIAGNOSIS — R208 Other disturbances of skin sensation: Secondary | ICD-10-CM

## 2015-01-10 NOTE — Progress Notes (Signed)
[facility]    Provider:  Dr Jaynee Eagles Referring Provider: Hubbard Robinson, MD Primary Care Physician:  Hubbard Robinson, MD  CC:  Numbness in the legs  HPI:  [name] is a 49 y.o. female here as a referral from Dr. Effie Berkshire for numbness in the legs. 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 Numbness started in April. Patient had lumbar Surgery in June, lumbar fusion L5-S1. But in April she had an incident of legs totally going numb, she couldn't feel them from the waist down. She was hospitalized for 4 days. Since surgery she has had 3 falls with the same numbness from the waist to the toes. The last time it happened was 3 weeks ago. She can't feel anything from the belly button to the legs, all the skin is involved in no dermatomal pattern. Sometimes she has dizziness and blurred vision. No loc or altered mentation when the symptoms happen. She goes numb in bed as well. The symptoms last 2-3 hours. It starts acutely, it resolves slowly sometimes other times it immediately goes away. No known triggers, she endorses lower extremity weakness as well. The symptoms feel completely resolved in between episodes. The numbness is painful. Symptoms reaoslve completely in between episodes.   Reviewed notes, labs and imaging from outside physicians, which showed:  Ct of the head in April 2016showed No acute intracranial abnormalities including mass lesion or mass effect, hydrocephalus, extra-axial fluid collection, midline shift, hemorrhage, or acute infarction, large ischemic events (personally reviewed images)  Patient presented to the emergency room in April 2016 with a reported prior CVA with acute onset of left-sided weakness and slurred  speech. Somatization or conversion disorder vssecondary gain was suspected. She had had prior history of left-sided weakness in the past and received IV TPA in the past in 2014 for similar presentation. Physical exam findings were inconsistent. CT of the head was unremarkable. Limited MRI with diffusion weighted imaging showed no acute stroke. She was evaluated by psychiatry inpatient. Drug seeking behavior.      Review of Systems: Patient complains of symptoms per HPI as well as the following symptoms: Chest pain, shortness of breath, snoring, anemia, memory loss, confusion, numbness, weakness, insomnia, depression, anxiety, not enough sleep, change in appetite, racing thoughts. Pertinent negatives per HPI. All others negative.   Social History   Social History  . Marital Status: Single    Spouse Name: N/A  . Number of Children: 4  . Years of Education: 16   Occupational History  . Disabled     Social History Main Topics  . Smoking status: Former Smoker -- 0.50 packs/day for 25 years    Types: Cigarettes  . Smokeless tobacco: Not on file  . Alcohol Use: No     Comment: "hasn't touch alcohol a little over 4 yrs"  . Drug Use: Yes     Comment: history of drug seeking behavior  . Sexual Activity: Not on file   Other Topics Concern  . Not on file   Social History Narrative   Lives at home by yourself   Caffeine use: Drinks coffee (5-6 cups per day)    Family History  Problem Relation Age of Onset  . Diabetes Mother   . Diabetes Father   . Hyperlipidemia Mother   . Hyperlipidemia Father   . Hypertension Mother   .  Hypertension Father   . Cancer    . Heart disease      Past Medical History  Diagnosis Date  . Somatization disorder   . Depression   . Anxiety   . Beta thalassemia trait   . CVA (cerebral infarction)   . Pancreatitis   . Hypertension   . Coronary artery disease   . Stroke     years ago.  no deficits now  . Sleep apnea     tested more than 5-6 yrs  ago.  Unable to get mask d/t lack of insurance at the time  . Shortness of breath dyspnea     states she does use inhaler, but has never been dx with asthma or bronchitis.  she does smoke however  . History of hiatal hernia   . Breast cancer     breast cancer on right--mastectomy and has implant  . Sickle cell anemia   . Acid reflux     Past Surgical History  Procedure Laterality Date  . Tubal ligation    . Cesarean section    . Partial hysterectomy    . Cardiac catheterization    . Breast lumpectomy    . Port a cath revision      states she's had it for several yrs for sickle cell.  . Lumbar fusion N/A 10/26/2014    Procedure: LUMBAR FUSION L5-S1 WITH PEDICLE SCREWS, RODS, CAGE, VIVIGEN, ALLOGRAFT BONE GRAFT;  Surgeon: Jessy Oto, MD;  Location: [facility];  Service: Orthopedics;  Laterality: N/A;    Current Outpatient Prescriptions  Medication Sig Dispense Refill  . albuterol (PROVENTIL HFA;VENTOLIN HFA) 108 (90 BASE) MCG/ACT inhaler Inhale 2 puffs into the lungs every 4 (four) hours as needed for shortness of breath. PROAIR    . ALPRAZolam (XANAX) 1 MG tablet Take 1 mg by mouth 3 (three) times daily.     Marland Kitchen aspirin EC 325 MG EC tablet Take 1 tablet (325 mg total) by mouth daily. 60 tablet 0  . aspirin EC 325 MG tablet Take 325 mg by mouth daily.    . clopidogrel (PLAVIX) 75 MG tablet Take 75 mg by mouth daily.    . furosemide (LASIX) 20 MG tablet Take 20 mg by mouth daily as needed for edema.     . gabapentin (NEURONTIN) 600 MG tablet Take 600 mg by mouth 3 (three) times daily.    . hydroxyurea (HYDREA) 500 MG capsule Take 500 mg by mouth 2 (two) times daily. May take with food to minimize GI side effects.    Marland Kitchen lisinopril (PRINIVIL,ZESTRIL) 20 MG tablet Take 20 mg by mouth daily.    . mirtazapine (REMERON) 45 MG tablet Take 45 mg by mouth at bedtime.   3  . nitroGLYCERIN (NITROSTAT) 0.4 MG SL tablet Place 0.4 mg under the tongue every 5 (five) minutes as needed for chest pain.    Marland Kitchen  OLANZapine (ZYPREXA) 20 MG tablet Take 20 mg by mouth at bedtime.    Marland Kitchen oxycodone (ROXICODONE) 30 MG immediate release tablet Take 30 mg by mouth as needed.  0  . promethazine (PHENERGAN) 25 MG tablet Take 1 tablet (25 mg total) by mouth every 8 (eight) hours as needed for nausea. 40 tablet 0  . ranitidine (ZANTAC) 150 MG tablet Take 150 mg by mouth 3 (three) times daily.     Marland Kitchen tiZANidine (ZANAFLEX) 4 MG tablet Take 4 mg by mouth 3 (three) times daily.     Marland Kitchen venlafaxine XR (EFFEXOR-XR) 150  MG 24 hr capsule Take 150 mg by mouth at bedtime.   5  . zolpidem (AMBIEN) 10 MG tablet Take 10 mg by mouth at bedtime.      No current facility-administered medications for this visit.    Allergies as of 01/10/2015 - Review Complete 10/26/2014  Allergen Reaction Noted  . Latex Shortness Of Breath, Itching, Swelling, and Rash 11/26/2007  . Lidocaine Anaphylaxis 01/28/2013  . Metoclopramide hcl Shortness Of Breath, Itching, Swelling, and Rash 11/26/2007  . Penicillins Shortness Of Breath, Itching, Swelling, and Rash 11/26/2007  . Fentanyl Itching and Rash 11/26/2007  . Hydromorphone hcl Itching and Rash 11/26/2007  . Iohexol Itching and Rash 08/10/2008  . Ketorolac tromethamine Itching and Rash 11/26/2007  . Ondansetron hcl Itching and Rash 11/26/2007  . Prochlorperazine edisylate Itching and Rash 11/26/2007    Vitals: BP 109/77 mmHg  Pulse 84  Ht 5\' 4"  (1.626 m)  Wt 212 lb 8 oz (96.389 kg)  BMI 36.46 kg/m2 Last Weight:  Wt Readings from Last 1 Encounters:  01/10/15 212 lb 8 oz (96.389 kg)   Last Height:   Ht Readings from Last 1 Encounters:  01/10/15 5\' 4"  (1.626 m)    Physical exam: Exam: Gen: NAD, conversant, obese                   CV: RRR, no MRG. No Carotid Bruits. No peripheral edema, warm, nontender Eyes: Conjunctivae clear without exudates or hemorrhage  Neuro: Detailed Neurologic Exam  Speech:    Speech is normal; fluent and spontaneous with normal comprehension.    Cognition:    The patient is oriented to person, place, and time;     recent and remote memory intact;     language fluent;     normal attention, concentration,     fund of knowledge Cranial Nerves:    The pupils are equal, round, and reactive to light. The fundi are flat Visual fields are full to finger confrontation. Extraocular movements are intact. Trigeminal sensation is intact and the muscles of mastication are normal. The face is symmetric. The palate elevates in the midline. Hearing intact. Voice is normal. Shoulder shrug is normal. The tongue has normal motion without fasciculations.   Coordination: cant perform HTS due to pain  Gait:    Antalgic gait  Motor Observation:    No asymmetry, no atrophy, and no involuntary movements noted. Tone:    Normal muscle tone.    Posture:    Posture is normal. normal erect    Strength:    Strength is V/V in the upper extremities. She has poor effort and giveway in the lowers reportedly due to pain       Sensation: Patient reports numbness that extends to the T4 level.     Reflex Exam:  DTR's: absent patellars otherwise normal Toes:    The toes are downgoing bilaterally.   Clonus:    Clonus is absent.  Sensory level     Assessment/Plan:  49 y.o. female here as a referral from Dr. Effie Berkshire for numbness in the legs. 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 Numbness started in April. Patient had lumbar Surgery in June, lumbar fusion L5-S1. She has episodes of total numbness from the chest down to the toes. Symptoms resolved in between episodes. The episodes last several hours. Today on  exam she endorses a thoracic sensory level.  Mri cervical and thoracic spines with/without contrast Can  consider lumbar puncture if negative however symptoms are episodic which is unusual She can follow-up with me in 3 months, advise her to call me if symptoms worsen  Sarina Ill, MD  [facility] [address] [place], [place] [address]  Phone [phone] Fax [phone]

## 2015-01-10 NOTE — Patient Instructions (Signed)
Overall you are doing fairly well but I do want to suggest a few things today:   Remember to drink plenty of fluid, eat healthy meals and do not skip any meals. Try to eat protein with a every meal and eat a healthy snack such as fruit or nuts in between meals. Try to keep a regular sleep-wake schedule and try to exercise daily, particularly in the form of walking, 20-30 minutes a day, if you can.   As far as diagnostic testing: MRi thoracic and cervical  I would like to see you back in 3 months, sooner if we need to. Please call us with any interim questions, concerns, problems, updates or refill requests.   Our phone number is [phone]. We also have an after hours call service for urgent matters and there is a physician on-call for urgent questions. For any emergencies you know to call 911 or go to the nearest emergency room

## 2015-01-26 ENCOUNTER — Ambulatory Visit
Admission: RE | Admit: 2015-01-26 | Discharge: 2015-01-26 | Disposition: A | Discharge status: Home / Self Care | Payer: Medicare Other | Source: Ambulatory Visit | Attending: Neurology | Admitting: Neurology

## 2015-01-26 DIAGNOSIS — W19XXXA Unspecified fall, initial encounter: Secondary | ICD-10-CM

## 2015-01-26 DIAGNOSIS — R270 Ataxia, unspecified: Secondary | ICD-10-CM

## 2015-01-26 DIAGNOSIS — G959 Disease of spinal cord, unspecified: Secondary | ICD-10-CM

## 2015-01-26 DIAGNOSIS — R200 Anesthesia of skin: Secondary | ICD-10-CM

## 2015-01-27 ENCOUNTER — Telehealth: Payer: Self-pay | Admitting: Neurology

## 2015-01-27 NOTE — Telephone Encounter (Signed)
Called pt back to advise that I got her message and give Dr. Jaynee Eagles the message. Dr. Jaynee Eagles is out of the office and will call her back Monday at the latest. She verbalized understanding.

## 2015-01-27 NOTE — Telephone Encounter (Signed)
Pt called and said that she was unable to finish MRI yesterday due to back pain. Pt states she took her pain medicine but it did not help. She would like to know are there other options.She expressed that she knows that it needs to be done and would like to know how or what.  Please call and advise [phone]

## 2015-01-31 NOTE — Telephone Encounter (Signed)
I called pt back regarding MRI.  She already takes xanax 1mg  daily and took it before her MRI last week along with her pain medication. Neither of these helped. The problem she said she is having is she cannot lay flat for an hour because of her back. She just had back surgery and cannot do the MRI and lay flat like that. Told her Dr Jaynee Eagles is out of the office due to a family emergency, but I will let Dr. Jaynee Eagles know and call her back as soon as possible. She verbalized understanding.

## 2015-01-31 NOTE — Telephone Encounter (Signed)
LVM for [name] in spine services at [facility] imaging to clarify how long each MRI will take. Gave Pt name/DOB/MRN number. Gave [facility] Phone number.

## 2015-01-31 NOTE — Telephone Encounter (Signed)
Called and spoke with [place] from [facility] imaging. She stated each MRI would take 45 minutes. Told her to let Anderson Malta in spine services know to disregard voicemail I left her. She sent Anderson Malta a message.

## 2015-01-31 NOTE — Telephone Encounter (Signed)
[name], I ordered 2 MRIs. If she thinks she can lay flat long enough to get one done, then we can have them done separately on different days with the thoracic spine first. You can ask [facility] imaging how long each takes separately so she knows how long she will have to lay still. If she can't tolerate doing that, the only next step is sedating her with anesthesia in the hospital which involves anesthesiology. I would recommend her trying to have the images done separately, taking pain medications and xanax beforehand and trying to make it through. Getting anesthesia to put her out is extensiv but we can do it if necessary.

## 2015-01-31 NOTE — Telephone Encounter (Signed)
[name] - we can provide her with some Xanax before the MRI if she does not already have that prescribed. Please discuss with her thanks

## 2015-01-31 NOTE — Telephone Encounter (Signed)
LVM for pt to call back so I can relay information from Dr. Mikeal Hawthorne imaging. Gave [facility] phone number and told her I will be out of the office after 330pm today but will be back at 8am tomorrow.

## 2015-02-01 NOTE — Telephone Encounter (Addendum)
Received incoming call from pt. Advised that Dr. Jaynee Eagles ordered 2 MRI's and each test would take 45 minutes each. She said she does not think she can do the test, even separate. Even taking Xanax and pain medication will not help with her back pain. She asked if MRI could be split up in the 45 minutes period where she could take a break. I advised that she would have to sit still for that 45 minute period to get an accurate image. She declined wanting to do this. I also advised per Dr. Jaynee Eagles that she could do imaging under anesthesia, but it would have to be done at the hospital. This is very extensive and Dr. Jaynee Eagles recommends doing imaging without anesthesia first, but can do this if necessary. Pt would like to do imaging under anesthesia. I told her I will let Dr. Jaynee Eagles know and she is out of the office until Thursday. Pt also had fall this past Sunday and has not seen anyone for it. She said she did not hit her head and she fell from her waist down. She is okay. I will give her a call back once I speak with Dr. Jaynee Eagles to advise what she needs to do to get testing done. She verbalized understanding.

## 2015-02-02 NOTE — Telephone Encounter (Signed)
[name] - can you discuss with Seth Bake about getting these set up with sedation please?

## 2015-02-02 NOTE — Telephone Encounter (Signed)
Called and spoke to pt to let her know she is scheduled at [facility] for 03/08/15 at 10am and [facility] will be calling her to go over appt information and they will call her as well on what she needs to do before her appt. She will also have to have a driver that day. Pt verbalized understanding.

## 2015-02-02 NOTE — Telephone Encounter (Signed)
Drucie Opitz in scheduling department spoke with Elmo Putt from [facility]. She is scheduled on Tuesday 03/08/15 @ [facility] will be calling her before her appointment. She will need a driver  . No earlier appt available.  [facility], [facility], [facility] cannot procedure under sedation.

## 2015-03-03 ENCOUNTER — Encounter (HOSPITAL_COMMUNITY): Payer: Self-pay

## 2015-03-03 ENCOUNTER — Telehealth: Payer: Self-pay | Admitting: Neurology

## 2015-03-03 ENCOUNTER — Encounter (HOSPITAL_COMMUNITY)
Admission: RE | Admit: 2015-03-03 | Discharge: 2015-03-03 | Disposition: A | Discharge status: Home / Self Care | Payer: Medicare Other | Source: Ambulatory Visit | Attending: Internal Medicine | Admitting: Internal Medicine

## 2015-03-03 DIAGNOSIS — Z7982 Long term (current) use of aspirin: Secondary | ICD-10-CM | POA: Diagnosis not present

## 2015-03-03 DIAGNOSIS — Z01818 Encounter for other preprocedural examination: Secondary | ICD-10-CM | POA: Diagnosis present

## 2015-03-03 DIAGNOSIS — I10 Essential (primary) hypertension: Secondary | ICD-10-CM | POA: Diagnosis not present

## 2015-03-03 DIAGNOSIS — Z01812 Encounter for preprocedural laboratory examination: Secondary | ICD-10-CM | POA: Insufficient documentation

## 2015-03-03 DIAGNOSIS — Z7902 Long term (current) use of antithrombotics/antiplatelets: Secondary | ICD-10-CM | POA: Diagnosis not present

## 2015-03-03 DIAGNOSIS — Z981 Arthrodesis status: Secondary | ICD-10-CM | POA: Diagnosis not present

## 2015-03-03 DIAGNOSIS — Z87891 Personal history of nicotine dependence: Secondary | ICD-10-CM | POA: Insufficient documentation

## 2015-03-03 DIAGNOSIS — Z79899 Other long term (current) drug therapy: Secondary | ICD-10-CM | POA: Diagnosis not present

## 2015-03-03 DIAGNOSIS — I2510 Atherosclerotic heart disease of native coronary artery without angina pectoris: Secondary | ICD-10-CM | POA: Diagnosis not present

## 2015-03-03 DIAGNOSIS — G4733 Obstructive sleep apnea (adult) (pediatric): Secondary | ICD-10-CM | POA: Insufficient documentation

## 2015-03-03 DIAGNOSIS — Z8673 Personal history of transient ischemic attack (TIA), and cerebral infarction without residual deficits: Secondary | ICD-10-CM | POA: Diagnosis not present

## 2015-03-03 DIAGNOSIS — Z853 Personal history of malignant neoplasm of breast: Secondary | ICD-10-CM | POA: Diagnosis not present

## 2015-03-03 HISTORY — DX: Bipolar disorder, unspecified: F31.9

## 2015-03-03 HISTORY — DX: Headache: R51

## 2015-03-03 HISTORY — DX: Calculus of kidney: N20.0

## 2015-03-03 HISTORY — DX: Presence of spectacles and contact lenses: Z97.3

## 2015-03-03 HISTORY — DX: Unspecified osteoarthritis, unspecified site: M19.90

## 2015-03-03 HISTORY — DX: Headache, unspecified: R51.9

## 2015-03-03 LAB — CBC
HCT: 36.2 % (ref 36.0–46.0)
HEMOGLOBIN: 12.3 g/dL (ref 12.0–15.0)
MCH: 23.1 pg — AB (ref 26.0–34.0)
MCHC: 34 g/dL (ref 30.0–36.0)
MCV: 67.9 fL — AB (ref 78.0–100.0)
PLATELETS: 325 10*3/uL (ref 150–400)
RBC: 5.33 MIL/uL — AB (ref 3.87–5.11)
RDW: 15.9 % — ABNORMAL HIGH (ref 11.5–15.5)
WBC: 7 10*3/uL (ref 4.0–10.5)

## 2015-03-03 LAB — BASIC METABOLIC PANEL
ANION GAP: 9 (ref 5–15)
BUN: 8 mg/dL (ref 6–20)
CO2: 21 mmol/L — AB (ref 22–32)
CREATININE: 0.64 mg/dL (ref 0.44–1.00)
Calcium: 9.4 mg/dL (ref 8.9–10.3)
Chloride: 107 mmol/L (ref 101–111)
GFR calc non Af Amer: >60 mL/min (ref >60)
GLUCOSE: 89 mg/dL (ref 65–99)
Potassium: 4.2 mmol/L (ref 3.5–5.1)
Sodium: 137 mmol/L (ref 135–145)

## 2015-03-03 NOTE — Telephone Encounter (Signed)
[name], schedule patient for an appointment please, see note below thanks

## 2015-03-03 NOTE — Telephone Encounter (Signed)
[name] with [name] pre admissions states the patient is schedule for an MRI on 11-8. [name] needs an updated History & Physical for the patient before the patient has an MRI. The History & Physical needs to be done 30 days before the MRI. Please call and discuss. Thank you.

## 2015-03-03 NOTE — Progress Notes (Signed)
Pt denies SOB and chest pain but is under the care of Dr. Claudie Fisherman, cardiology at [facility] in [place], [place]. Spoke with Enid Derry at Dr. Cathren Laine office to make MD aware that pt needs an updated History and Physical prior to procedure on 03/08/15. Pt chart forwarded to [place], [place], anesthesia, to review cardiac history.

## 2015-03-03 NOTE — Pre-Procedure Instructions (Signed)
    SOPHI CALLIGAN  03/03/2015      [id number] - [place], [place] [address] Phone: [phone] Fax: [phone]  [id number] - [place], [place] [address] Phone: [phone] Fax: [phone]    Your procedure is scheduled on Tuesday, March 08, 2015  Report to [facility] Admitting at 8:00 A.M.  Call this number if you have problems the morning of surgery:  [phone]   Remember:  Do not eat food or drink liquids after midnight Monday, March 07, 2015  Take these medicines the morning of surgery with A SIP OF WATER:ALPRAZolam (XANAX)  aspirin, clopidogrel (PLAVIX), gabapentin (NEURONTIN), ranitidine (ZANTAC), if needed: Oxycodone ( Roxicodone) for pain, promethazine (PHENERGAN) for nausea,  nitroGLYCERIN (NITROSTAT) for chest pain, albuterol (PROVENTIL HFA;VENTOLIN HFA) inhaler for shortness of breath ( bring inhaler in with you on day of procedure) Stop taking vitamins, herbal medications.  Do not wear jewelry, make-up or nail polish.  Do not wear lotions, powders, or perfumes.  You may not wear deodorant.  Do not shave 48 hours prior to surgery.  Do not bring valuables to the hospital.  [facility] is not responsible for any belongings or valuables.  Contacts, dentures or bridgework may not be worn into surgery.  Leave your suitcase in the car.  After surgery it may be brought to your room.  For patients admitted to the hospital, discharge time will be determined by your treatment team.  Patients discharged the day of surgery will not be allowed to drive home.   Name and phone number of your driver:   Special instructions:   Please read over the following fact sheets that you were given. Pain Booklet and Coughing and Deep Breathing

## 2015-03-04 NOTE — Telephone Encounter (Signed)
Called pt and informed her about appt time and transportation pick-up time. She verbalized understanding.

## 2015-03-04 NOTE — Progress Notes (Signed)
Pt is 49 year old female scheduled for MRI thoracic spine with anesthesia on 03/08/2015.   PMH includes: somatization disorder (see 08/19/14 Epic psychiatry consult note for details), CVA, HTN, CAD, pancreatitis, OSA, breast cancer, sickle cell. Former smoker. BMI 37. S/p lumbar fusion L5-S1 10/26/14.   Medications include: amlodipine, ASA, plavix, lasix, hydroxyurea, lisinopril, remeron, zyprexa, potassium, zocor. Pt stopped plavix 10/15/14 for surgery.   Pt hospitalized 04/2014 and 06/2014 (see care everywhere) for chest pain. Pt was advised to have stress test but refused and left AMA.   Preoperative labs reviewed.   EKG 08/17/2014: Sinus rhythm. Baseline wander in lead(s) II III aVR aVF V1 V2.  1 view CXR 10/26/14: Right IJ Port-A-Cath is unchanged in position. Left IJ central line with tip in left innominate vein. No pneumothorax.  Nuclear stress test 05/20/2014 (care everywhere): -Rest only nuclear stress test showing normal perfusion in the resting exam. Stress not completed due to pt refusal.   Echo 10/16/2012:  - Left ventricle: The cavity size was normal. Systolic function was normal. The estimated ejection fraction was in the range of 60% to 65%. Wall motion was normal; there were no regional wall motion abnormalities. - Pericardium, extracardiac: A trivial pericardial effusion was identified.  Carotid duplex US 10/16/2012:  - The vertebral arteries appear patent with antegrade flow. - Findings consistent with less than 39 percent stenosis involving the right internal carotid artery and the left internal carotid artery.  Pt had cardiac clearance for lumbar fusion 10/26/14. Dr. Claudie Fisherman at [facility] in [place], [place] cleared pt for surgery based on reassuring EKG, absence of CV symptoms and normal stress test from 2013.   Pt's H&P needs to be updated. She has appt scheduled with neurology on 03/07/15  As pt tolerated lumbar fusion 4 months ago, if H&P updated I  anticipate pt can proceed with MRI as scheduled.   Willeen Cass, FNP-BC [facility] Stay [facility]/Anesthesiology Phone: [phone] 03/04/2015 12:33 PM

## 2015-03-04 NOTE — Telephone Encounter (Signed)
LVM for pt to call back. Gave [facility] phone number and hours. Please schedule pt for appt with [name] or [name] on Monday. She needs to have history and physical done before her MRI at least within the last 30 days. She saw Dr Jaynee Eagles last over 30 days ago.

## 2015-03-04 NOTE — Telephone Encounter (Signed)
Pt called office back. She said I will need to call medicaid transportation to explain why she needed this appt. They need typically a 5 day notice. That is why I need to call and get it approved. They will ask for her information. Give them both her cell and home number per pt. Told her I will call them and then call her back to let her know the pick up time.   Phone: [phone] Medicaid ID [id number]  Pick up: [address]  [place] [address] Drop off:  [facility]  [address] [place], [place] [address]  Called and spoke to Mehan at [employer] care for transportation. Transportation is going to get pt at 9:45am for pick up. 15 minute window for pick-up. Between 930-1000am for driver to arrive. She scheduled for return pick-up at 12:00pm. Made reservation Monday 03/07/15.   Reference number: [id number]

## 2015-03-04 NOTE — Telephone Encounter (Signed)
LVM for pt to call back. See message below if she calls back. If she cannot make appt for Monday, she will have to r/s MRI.   LVM for Isaias Sakai to call back about pt MRI scheduled for next week. Gave [facility] phone number and hours.

## 2015-03-04 NOTE — Telephone Encounter (Signed)
Called [facility] back and she advised Sherlynn Stalls was not here today. Spoke with Jeannene Patella. She said pt needs to have history and physical before MRI on 03/08/15. If she needs to reschedule MRI, she needs to call [phone]. Told her I will call pt and try and make appt for 03/07/15 in our office.

## 2015-03-07 ENCOUNTER — Ambulatory Visit (INDEPENDENT_AMBULATORY_CARE_PROVIDER_SITE_OTHER): Payer: Medicare Other | Admitting: Nurse Practitioner

## 2015-03-07 ENCOUNTER — Encounter: Payer: Self-pay | Admitting: Nurse Practitioner

## 2015-03-07 VITALS — BP 124/76 | HR 92 | Ht 65.0 in | Wt 208.4 lb

## 2015-03-07 DIAGNOSIS — R270 Ataxia, unspecified: Secondary | ICD-10-CM | POA: Diagnosis not present

## 2015-03-07 DIAGNOSIS — R208 Other disturbances of skin sensation: Secondary | ICD-10-CM | POA: Diagnosis not present

## 2015-03-07 DIAGNOSIS — I10 Essential (primary) hypertension: Secondary | ICD-10-CM | POA: Diagnosis not present

## 2015-03-07 DIAGNOSIS — R200 Anesthesia of skin: Secondary | ICD-10-CM

## 2015-03-07 DIAGNOSIS — R296 Repeated falls: Secondary | ICD-10-CM | POA: Diagnosis not present

## 2015-03-07 NOTE — Patient Instructions (Signed)
H and P for MRI already scheduled for tommorow F/U as planned with Dr. Jaynee Eagles after MRI

## 2015-03-07 NOTE — Progress Notes (Addendum)
[facility]  PATIENT: [name] DOB: [birth year]   REASON FOR VISIT: Ataxia, falls, numbness in both legs HISTORY FROM: Patient    HISTORY OF PRESENT ILLNESS: [name], 49 year old female returns for follow-up. She was last seen by Dr. Jaynee Eagles, 01/10/2015. Patient needs general anesthesia to have an MRI. Her symptoms have not changed since she was seen by Dr. Jaynee Eagles in September, she had 2 recent falls, she still has numbness from the waist down. She also has a history of bipolar disorder. She returns for reevaluation  HISTORY: [name] is a 49 y.o. female here as a referral from Dr. Effie Berkshire for numbness in the legs. 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 Numbness started in April. Patient had lumbar Surgery in June, lumbar fusion L5-S1. But in April she had an incident of legs totally going numb, she couldn't feel them from the waist down. She was hospitalized for 4 days. Since surgery she has had 3 falls with the same numbness from the waist to the toes. The last time it happened was 3 weeks ago. She can't feel anything from the belly button to the legs, all the skin is involved in no dermatomal pattern. Sometimes she has dizziness and blurred vision. No loc or altered mentation when the symptoms happen. She goes numb in bed as well. The symptoms last 2-3 hours. It starts acutely, it resolves slowly sometimes other times it immediately goes away. No known triggers, she endorses lower extremity weakness as well. The symptoms feel completely resolved in between episodes. The numbness is painful. Symptoms reaoslve completely in between episodes.   Patient presented to the emergency room in April 2016 with a reported prior  CVA with acute onset of left-sided weakness and slurred speech. Somatization or conversion disorder vssecondary gain was suspected. She had had prior history of left-sided weakness in the past and received IV TPA in the past in 2014 for similar presentation. Physical exam findings were inconsistent. CT of the head was unremarkable. Limited MRI with diffusion weighted imaging showed no acute stroke. She was evaluated by psychiatry inpatient. Drug seeking behavior.  REVIEW OF SYSTEMS: Full 14 system review of systems performed and notable only for those listed, all others are neg:  Constitutional: Sweating  Cardiovascular: neg Ear/Nose/Throat: neg  Skin: neg Eyes: neg Respiratory: Shortness of breath Gastroitestinal: Abdominal pain Hematology/Lymphatic: Anemia  Endocrine: neg Musculoskeletal: Back pain, walking difficulty Allergy/Immunology: neg Neurological: Migraines, numbness, weakness Psychiatric: neg Sleep : Restless leg   ALLERGIES: Allergies  Allergen Reactions  . Latex Shortness Of Breath, Itching, Swelling and Rash    Also Tongue swelling; has been intubated secondary to this reaction   . Lidocaine Anaphylaxis    Occurred 11/14 with right mastectomy  . Metoclopramide Hcl Shortness Of Breath, Itching, Swelling and Rash    Also tongue swelling   . Penicillins Shortness Of Breath, Itching, Swelling and Rash    Also tongue swelling   . Fentanyl Itching and Rash  . Hydromorphone Hcl Itching and Rash  . Iohexol Itching and Rash  . Ketorolac Tromethamine Itching and Rash  . Ondansetron Hcl Itching and Rash  . Prochlorperazine Edisylate Itching and Rash    HOME MEDICATIONS: Outpatient Prescriptions Prior to Visit  Medication Sig Dispense Refill  . albuterol (PROVENTIL HFA;VENTOLIN HFA) 108 (90 BASE) MCG/ACT inhaler Inhale  2 puffs into the lungs every 4 (four) hours as needed for shortness of breath. PROAIR    . ALPRAZolam (XANAX) 1 MG tablet Take 1 mg by mouth 3 (three)  times daily.     Marland Kitchen aspirin EC 325 MG EC tablet Take 1 tablet (325 mg total) by mouth daily. 60 tablet 0  . clopidogrel (PLAVIX) 75 MG tablet Take 75 mg by mouth daily.    . furosemide (LASIX) 20 MG tablet Take 20 mg by mouth daily as needed for edema.     . gabapentin (NEURONTIN) 600 MG tablet Take 600 mg by mouth 3 (three) times daily.    . hydroxyurea (HYDREA) 500 MG capsule Take 500 mg by mouth 2 (two) times daily. May take with food to minimize GI side effects.    Marland Kitchen lisinopril (PRINIVIL,ZESTRIL) 20 MG tablet Take 20 mg by mouth daily.    . mirtazapine (REMERON) 45 MG tablet Take 45 mg by mouth at bedtime.   3  . nitroGLYCERIN (NITROSTAT) 0.4 MG SL tablet Place 0.4 mg under the tongue every 5 (five) minutes as needed for chest pain.    Marland Kitchen OLANZapine (ZYPREXA) 20 MG tablet Take 20 mg by mouth at bedtime.    . Oxycodone HCl 20 MG TABS Take 20 mg by mouth every 6 (six) hours.    . promethazine (PHENERGAN) 25 MG tablet Take 1 tablet (25 mg total) by mouth every 8 (eight) hours as needed for nausea. 40 tablet 0  . ranitidine (ZANTAC) 150 MG tablet Take 150 mg by mouth 3 (three) times daily.     Marland Kitchen tiZANidine (ZANAFLEX) 4 MG tablet Take 4 mg by mouth 3 (three) times daily.     Marland Kitchen venlafaxine XR (EFFEXOR-XR) 150 MG 24 hr capsule Take 150 mg by mouth at bedtime.   5  . zolpidem (AMBIEN) 10 MG tablet Take 10 mg by mouth at bedtime.     Marland Kitchen aspirin EC 325 MG tablet Take 325 mg by mouth daily.    Marland Kitchen oxycodone (ROXICODONE) 30 MG immediate release tablet Take 30 mg by mouth as needed.  0   No facility-administered medications prior to visit.    PAST MEDICAL HISTORY: Past Medical History  Diagnosis Date  . Somatization disorder   . Depression   . Anxiety   . Beta thalassemia trait   . CVA (cerebral infarction)   . Pancreatitis   . Hypertension   . Coronary artery disease   . Stroke [facility])     years ago.  no deficits now  . Sleep apnea     tested more than 5-6 yrs ago.  Unable to get mask d/t lack  of insurance at the time  . Shortness of breath dyspnea     states she does use inhaler, but has never been dx with asthma or bronchitis.  she does smoke however  . History of hiatal hernia   . Breast cancer ([place])     breast cancer on right--mastectomy and has implant  . Sickle cell anemia ([facility])   . Acid reflux   . Bipolar disorder ([place])   . Kidney stones   . Headache   . Wears glasses   . Arthritis     PAST SURGICAL HISTORY: Past Surgical History  Procedure Laterality Date  . Tubal ligation    . Cesarean section    . Partial hysterectomy    . Cardiac catheterization    . Breast lumpectomy    . Port a cath revision  states she's had it for several yrs for sickle cell.  . Lumbar fusion N/A 10/26/2014    Procedure: LUMBAR FUSION L5-S1 WITH PEDICLE SCREWS, RODS, CAGE, VIVIGEN, ALLOGRAFT BONE GRAFT;  Surgeon: Jessy Oto, MD;  Location: [place];  Service: Orthopedics;  Laterality: N/A;  . Colonoscopy w/ biopsies and polypectomy    . Cholecystectomy    . Mastectomy      right with implant    FAMILY HISTORY: Family History  Problem Relation Age of Onset  . Diabetes Mother   . Hyperlipidemia Mother   . Hypertension Mother   . Diabetes Father   . Hyperlipidemia Father   . Hypertension Father   . Cancer    . Heart disease    . Neuropathy Neg Hx     SOCIAL HISTORY: Social History   Social History  . Marital Status: Single    Spouse Name: N/A  . Number of Children: 4  . Years of Education: 16   Occupational History  . Disabled     Social History Main Topics  . Smoking status: Current Every Day Smoker -- 0.50 packs/day for 25 years    Types: Cigarettes  . Smokeless tobacco: Never Used  . Alcohol Use: No     Comment: "hasn't touch alcohol a little over 4 yrs"  . Drug Use: Yes     Comment: history of drug seeking behavior  . Sexual Activity: Not on file   Other Topics Concern  . Not on file   Social History Narrative   Lives at home by yourself    Caffeine use: Drinks coffee (5-6 cups per day)     PHYSICAL EXAM  Filed Vitals:   03/07/15 1059  BP: 124/76  Pulse: 92  Height: 5\' 5"  (1.651 m)  Weight: 208 lb 6.4 oz (94.53 kg)   Body mass index is 34.68 kg/(m^2).  Generalized: Well developed, obese female in no acute distress  Head: normocephalic and atraumatic,. Oropharynx benign  Neck: Supple, no carotid bruits  Cardiac: Regular rate rhythm, no murmur  Musculoskeletal: No deformity   Neurological examination   Mentation: Alert oriented to time, place, history taking. Attention span and concentration appropriate. Recent and remote memory intact.  Follows all commands speech and language fluent.   Cranial nerve II-XII: Fundoscopic exam reveals sharp disc margins.Pupils were equal round reactive to light extraocular movements were full, visual field were full on confrontational test. Facial sensation and strength were normal. hearing was intact to finger rubbing bilaterally. Uvula tongue midline. head turning and shoulder shrug were normal and symmetric.Tongue protrusion into cheek strength was normal. Motor: normal bulk and tone, full strength in the BUE, poor effort and give way weakness in the lower extremities  Sensory: Patient reports numbness that extends to T10 . Pinprick intact in the upper extremities, diminished in the lower extremities, vibratory normal Coordination: finger-nose-finger, normal heel-to-shin difficulty due to pain  Reflexes: Brachioradialis 2/2, biceps 2/2, triceps 2/2, patellar absent, Achilles 2/2, plantar responses were flexor bilaterally. Gait and Station: Rising up from seated position without assistance, wide based stance,  Antalgic gait using rolling walker  DIAGNOSTIC DATA (LABS, IMAGING, TESTING) - I reviewed patient records, labs, notes, testing and imaging myself where available.  Lab Results  Component Value Date   WBC 7.0 03/03/2015   HGB 12.3 03/03/2015   HCT 36.2 03/03/2015   MCV 67.9*  03/03/2015   PLT 325 03/03/2015      Component Value Date/Time   NA 137 03/03/2015 1514  K 4.2 03/03/2015 1514   CL 107 03/03/2015 1514   CO2 21* 03/03/2015 1514   GLUCOSE 89 03/03/2015 1514   BUN 8 03/03/2015 1514   CREATININE 0.64 03/03/2015 1514   CALCIUM 9.4 03/03/2015 1514   PROT 6.9 10/20/2014 1021   ALBUMIN 3.7 10/20/2014 1021   AST 20 10/20/2014 1021   ALT 18 10/20/2014 1021   ALKPHOS 58 10/20/2014 1021   BILITOT 0.8 10/20/2014 1021   GFRNONAA >60 03/03/2015 1514   GFRAA >60 03/03/2015 1514       ASSESSMENT AND PLAN  49 y.o. year old female  has a past medical history of Somatization disorder; Depression; Anxiety; Beta thalassemia trait; CVA (cerebral infarction); Hypertension; Coronary artery disease; Stroke [facility]); Sleep apnea; Shortness of breath dyspnea;  Sickle cell anemia ([place]); Acid reflux; Bipolar disorder ([place]);  Headache; and episodes of total numbness from the waist down to the toes, her symptoms resolved in between episodes which can last several hours.  Patient to receive an MRI cervical and thoracic spine under general anesthesia which is the reason for this visit today She will follow-up with Dr. Jaynee Eagles as planned to discuss the  results of her testing, that visit is already scheduled [name], [facility], [facility], APRN  [facility] [address], [place], [place] [address] [phone]  Personally  participated in and made any corrections needed to history, physical, neuro exam,assessment and plan as stated above.  Sarina Ill, MD [facility]

## 2015-03-08 ENCOUNTER — Ambulatory Visit (HOSPITAL_COMMUNITY)
Admission: RE | Admit: 2015-03-08 | Discharge: 2015-03-08 | Disposition: A | Discharge status: Home / Self Care | Payer: Medicare Other | Source: Ambulatory Visit | Attending: Neurology | Admitting: Neurology

## 2015-03-08 ENCOUNTER — Encounter (HOSPITAL_COMMUNITY)
Admission: RE | Disposition: A | Discharge status: Home / Self Care | Payer: Self-pay | Source: Ambulatory Visit | Attending: Anesthesiology

## 2015-03-08 ENCOUNTER — Encounter (HOSPITAL_COMMUNITY): Payer: Self-pay | Admitting: *Deleted

## 2015-03-08 ENCOUNTER — Ambulatory Visit (HOSPITAL_COMMUNITY): Payer: Medicare Other | Admitting: Anesthesiology

## 2015-03-08 ENCOUNTER — Ambulatory Visit (HOSPITAL_COMMUNITY)
Admission: RE | Admit: 2015-03-08 | Discharge: 2015-03-08 | Disposition: A | Discharge status: Home / Self Care | Payer: Medicare Other | Source: Ambulatory Visit | Attending: Anesthesiology | Admitting: Anesthesiology

## 2015-03-08 ENCOUNTER — Ambulatory Visit (HOSPITAL_COMMUNITY): Payer: Medicare Other | Admitting: Emergency Medicine

## 2015-03-08 ENCOUNTER — Ambulatory Visit (HOSPITAL_COMMUNITY): Admission: RE | Admit: 2015-03-08 | Payer: Medicare Other | Source: Ambulatory Visit

## 2015-03-08 DIAGNOSIS — G959 Disease of spinal cord, unspecified: Secondary | ICD-10-CM | POA: Diagnosis not present

## 2015-03-08 DIAGNOSIS — R531 Weakness: Secondary | ICD-10-CM | POA: Diagnosis present

## 2015-03-08 DIAGNOSIS — R200 Anesthesia of skin: Secondary | ICD-10-CM | POA: Insufficient documentation

## 2015-03-08 DIAGNOSIS — R270 Ataxia, unspecified: Secondary | ICD-10-CM | POA: Diagnosis not present

## 2015-03-08 HISTORY — PX: RADIOLOGY WITH ANESTHESIA: SHX6223

## 2015-03-08 SURGERY — RADIOLOGY WITH ANESTHESIA
Anesthesia: General

## 2015-03-08 MED ORDER — METHOCARBAMOL 500 MG PO TABS
ORAL_TABLET | ORAL | Status: AC
  Filled 2015-03-08: qty 1

## 2015-03-08 MED ORDER — METHOCARBAMOL 500 MG PO TABS
500.0000 mg | ORAL_TABLET | Freq: Once | ORAL | Status: AC
  Administered 2015-03-08: 500 mg via ORAL

## 2015-03-08 MED ORDER — OXYCODONE HCL 5 MG PO TABS
ORAL_TABLET | ORAL | Status: AC
  Filled 2015-03-08: qty 2

## 2015-03-08 MED ORDER — OXYCODONE HCL 5 MG PO TABS
10.0000 mg | ORAL_TABLET | Freq: Once | ORAL | Status: AC
  Administered 2015-03-08: 10 mg via ORAL

## 2015-03-08 MED ORDER — LACTATED RINGERS IV SOLN: INTRAVENOUS | Status: DC

## 2015-03-08 MED ORDER — GADOBENATE DIMEGLUMINE 529 MG/ML IV SOLN
20.0000 mL | Freq: Once | INTRAVENOUS | Status: AC | PRN
  Administered 2015-03-08: 20 mL via INTRAVENOUS

## 2015-03-08 MED ORDER — HEPARIN SOD (PORK) LOCK FLUSH 100 UNIT/ML IV SOLN
500.0000 [IU] | INTRAVENOUS | Status: AC | PRN
Start: 2015-03-08 — End: 2015-03-08
  Administered 2015-03-08: 500 [IU]

## 2015-03-08 NOTE — Anesthesia Preprocedure Evaluation (Addendum)
Anesthesia Evaluation  Patient identified by MRN, date of birth, ID band Patient awake    Reviewed: Allergy & Precautions, NPO status , Patient's Chart, lab work & pertinent test results  History of Anesthesia Complications (+) DIFFICULT AIRWAY and history of anesthetic complications (anterior airway requiring glidescope intubation, no problems with mask ventilation per record)  Airway Mallampati: III  TM Distance: >3 FB Neck ROM: Full    Dental no notable dental hx. (+) Dental Advisory Given   Pulmonary sleep apnea , Current Smoker,    Pulmonary exam normal breath sounds clear to auscultation       Cardiovascular hypertension, Pt. on medications + CAD, + Cardiac Stents (in 2002, has only been off plavix for 2 days per patient) and + Peripheral Vascular Disease  Normal cardiovascular exam Rhythm:Regular Rate:Normal     Neuro/Psych  Headaches, PSYCHIATRIC DISORDERS Anxiety Depression Bipolar Disorder Hx of conversion disorder Numbness in bilateral legs CVA    GI/Hepatic negative GI ROS, Neg liver ROS, GERD  Medicated and Controlled,  Endo/Other  obesity  Renal/GU negative Renal ROS  negative genitourinary   Musculoskeletal  (+) Arthritis ,   Abdominal (+) + obese,   Peds negative pediatric ROS (+)  Hematology negative hematology ROS (+)   Anesthesia Other Findings   Reproductive/Obstetrics negative OB ROS                           Anesthesia Physical Anesthesia Plan  ASA: IV  Anesthesia Plan: General   Post-op Pain Management:    Induction: Intravenous  Airway Management Planned: Oral ETT  Additional Equipment:   Intra-op Plan:   Post-operative Plan: Extubation in OR  Informed Consent: I have reviewed the patients History and Physical, chart, labs and discussed the procedure including the risks, benefits and alternatives for the proposed anesthesia with the patient or  authorized representative who has indicated his/her understanding and acceptance.   Dental advisory given  Plan Discussed with: CRNA  Anesthesia Plan Comments:         Anesthesia Quick Evaluation

## 2015-03-08 NOTE — Progress Notes (Signed)
Spoke with Dr.Hodierne--okay to d/c home at this time.

## 2015-03-08 NOTE — Progress Notes (Signed)
Dr Jillyn Hidden notified of pt request to use PAC. IV team notified and was accessed per IV team without problems.Marland Kitchen

## 2015-03-08 NOTE — Anesthesia Postprocedure Evaluation (Signed)
  Anesthesia Post-op Note  Patient: [name]  Procedure(s) Performed: Procedure(s): MRI THORAIC SPINE   (RADIOLOGY WITH ANESTHESIA) (N/A)  Patient Location: PACU  Anesthesia Type: General   Level of Consciousness: awake, alert  and oriented  Airway and Oxygen Therapy: Patient Spontanous Breathing  Post-op Pain: mild  Post-op Assessment: Post-op Vital signs reviewed  Post-op Vital Signs: Reviewed  Last Vitals:  Filed Vitals:   03/08/15 1545  BP: 119/77  Pulse: 86  Temp:   Resp:     Complications: No apparent anesthesia complications

## 2015-03-08 NOTE — Transfer of Care (Signed)
Immediate Anesthesia Transfer of Care Note  Patient: ABBEE CREMEENS  Procedure(s) Performed: Procedure(s): MRI [facility] SPINE   (RADIOLOGY WITH ANESTHESIA) (N/A)  Patient Location: PACU  Anesthesia Type:General  Level of Consciousness: awake, oriented and patient cooperative  Airway & Oxygen Therapy: Patient Spontanous Breathing and Patient connected to nasal cannula oxygen  Post-op Assessment: Report given to RN and Post -op Vital signs reviewed and stable  Post vital signs: Reviewed  Last Vitals:  Filed Vitals:   03/08/15 1529  BP: 118/73  Pulse: 75  Temp: 36.2 C  Resp: 16    Complications: No apparent anesthesia complications

## 2015-03-08 NOTE — Anesthesia Procedure Notes (Signed)
Procedure Name: Intubation Date/Time: 03/08/2015 12:29 PM Performed by: Jenne Campus Pre-anesthesia Checklist: Patient identified, Emergency Drugs available, Suction available, Patient being monitored and Timeout performed Patient Re-evaluated:Patient Re-evaluated prior to inductionOxygen Delivery Method: Circle system utilized Preoxygenation: Pre-oxygenation with 100% oxygen Intubation Type: IV induction Ventilation: Mask ventilation without difficulty and Oral airway inserted - appropriate to patient size Grade View: Grade I Tube type: Oral Tube size: 7.0 mm Number of attempts: 1 Airway Equipment and Method: Video-laryngoscopy Placement Confirmation: positive ETCO2,  CO2 detector,  breath sounds checked- equal and bilateral and ETT inserted through vocal cords under direct vision Secured at: 21 cm Tube secured with: Tape Dental Injury: Teeth and Oropharynx as per pre-operative assessment

## 2015-03-09 ENCOUNTER — Encounter (HOSPITAL_COMMUNITY): Payer: Self-pay | Admitting: Radiology

## 2015-03-09 ENCOUNTER — Telehealth: Payer: Self-pay | Admitting: *Deleted

## 2015-03-09 NOTE — Telephone Encounter (Signed)
Pt returned Faith's call. Please return call to [phone]

## 2015-03-09 NOTE — Telephone Encounter (Signed)
I have spoken with Pam this afternoon, and per Dr. Lavell Anchors, advised that mri of c and t-spines was essentially normal.  She verbalized understanding of same/fim

## 2015-03-09 NOTE — Telephone Encounter (Signed)
LMTC./fim 

## 2015-03-09 NOTE — Telephone Encounter (Signed)
-----   Message from Melvenia Beam, MD sent at 03/08/2015  6:12 PM EST ----- Let patient  Know the mri of her cervical and thoracic cords were essentially normal thanks

## 2015-04-13 ENCOUNTER — Ambulatory Visit (INDEPENDENT_AMBULATORY_CARE_PROVIDER_SITE_OTHER): Payer: Medicare Other | Admitting: Neurology

## 2015-04-13 ENCOUNTER — Encounter: Payer: Self-pay | Admitting: Neurology

## 2015-04-13 VITALS — BP 123/88 | HR 93 | Ht 65.0 in | Wt 214.4 lb

## 2015-04-13 DIAGNOSIS — G629 Polyneuropathy, unspecified: Secondary | ICD-10-CM

## 2015-04-13 DIAGNOSIS — M5417 Radiculopathy, lumbosacral region: Secondary | ICD-10-CM

## 2015-04-13 NOTE — Progress Notes (Signed)
[facility]    CC: Numbness in the legs  Interval history. Her legs are still going numb. MRi brain, MRi of the cervical and thoracic cords were all normal. She is still having numbness from the waist down. Her left leg is numb. She feels pain in the lower back and then the numbness and it radiates to the back of the legs and to the bottom of her feet. She also has numbness in the front of the left leg. Always starts in the low back and radiates to the toes. She sees Dr. Louanne Skye for back problems. These symptoms are most pronounced after her lumbar surgery however she did have an episode in church before her surgery where she could not feel her legs. Since the surgery they are more often. The symptoms start at the waist and not higher.   HPI: [name] is a 49 y.o. female here as a referral from Dr. Effie Berkshire for numbness in the legs. 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 Numbness started in April. Patient had lumbar Surgery in June, lumbar fusion L5-S1. But in April she had an incident of legs totally going numb, she couldn't feel them from the waist down. She was hospitalized for 4 days. Since surgery she has had 3 falls with the same numbness from the waist to the toes. The last time it happened was 3 weeks ago. She can't feel anything from the belly button to the legs, all the skin is involved in no dermatomal pattern. Sometimes she has dizziness and blurred vision. No loc or altered mentation when the symptoms happen. She goes numb in bed as well. The symptoms last 2-3 hours. It starts acutely, it resolves slowly sometimes other times it immediately goes away. No known triggers, she endorses lower extremity weakness as well. The  symptoms feel completely resolved in between episodes. The numbness is painful. Symptoms reaoslve completely in between episodes.   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 Somatization or conversion disorder vssecondary gain was suspected. She had had prior history of left-sided weakness in the past and received IV TPA in the past in 2014 for similar presentation. Physical exam findings were inconsistent. CT of the head was unremarkable. Limited MRI with diffusion weighted imaging showed no acute stroke. She was evaluated by psychiatry inpatient. Drug seeking behavior.   Review of Systems: Patient complains of symptoms per HPI as well as the following symptoms: Chills, fatigue, shortness of breath, chest tightness, chest pain, abdominal pain, nausea, vomiting, insomnia, apnea, snoring, joint pain, back pain, walking difficulty, dizziness, headache, numbness, weakness, depression, anxiety,. Pertinent negatives per HPI. All others negative.   Social History   Social History  . Marital Status: Single    Spouse Name: N/A  . Number of Children: 4  . Years of Education: 16   Occupational History  . Disabled     Social History Main Topics  . Smoking status: Current Every Day Smoker -- 0.50 packs/day for 25  years    Types: Cigarettes  . Smokeless tobacco: Never Used  . Alcohol Use: No     Comment: "hasn't touch alcohol a little over 4 yrs"  . Drug Use: Yes     Comment: history of drug seeking behavior  . Sexual Activity: Not on file   Other Topics Concern  . Not on file     Social History Narrative   Lives at home by yourself   Caffeine use: Drinks coffee (5-6 cups per day)    Family History  Problem Relation Age of Onset  . Diabetes Mother   . Hyperlipidemia Mother   . Hypertension Mother   . Diabetes Father   . Hyperlipidemia Father   . Hypertension Father   . Cancer    . Heart disease    . Neuropathy Neg Hx     Past Medical History  Diagnosis Date  . Somatization disorder   . Depression   . Anxiety   . Beta thalassemia trait   . CVA (cerebral infarction)   . Pancreatitis   . Hypertension   . Coronary artery disease   . Stroke [facility])     years ago.  no deficits now  . Sleep apnea     tested more than 5-6 yrs ago.  Unable to get mask d/t lack of insurance at the time  . Shortness of breath dyspnea     states she does use inhaler, but has never been dx with asthma or bronchitis.  she does smoke however  . History of hiatal hernia   . Breast cancer ([place])     breast cancer on right--mastectomy and has implant  . Sickle cell anemia ([facility])   . Acid reflux   . Bipolar disorder ([place])   . Kidney stones   . Headache   . Wears glasses   . Arthritis     Past Surgical History  Procedure Laterality Date  . Tubal ligation    . Cesarean section    . Partial hysterectomy    . Cardiac catheterization    . Breast lumpectomy    . Port a cath revision      states she's had it for several yrs for sickle cell.  . Lumbar fusion N/A 10/26/2014    Procedure: LUMBAR FUSION L5-S1 WITH PEDICLE SCREWS, RODS, CAGE, VIVIGEN, ALLOGRAFT BONE GRAFT;  Surgeon: Jessy Oto, MD;  Location: [place];  Service: Orthopedics;  Laterality: N/A;  . Colonoscopy w/ biopsies and polypectomy    . Cholecystectomy    . Mastectomy      right with implant  . Radiology with anesthesia N/A 03/08/2015    Procedure: MRI THORAIC SPINE   (RADIOLOGY WITH ANESTHESIA);  Surgeon: Medication Radiologist, MD;  Location: [facility];  Service: Radiology;  Laterality: N/A;    Current  Outpatient Prescriptions  Medication Sig Dispense Refill  . albuterol (PROVENTIL HFA;VENTOLIN HFA) 108 (90 BASE) MCG/ACT inhaler Inhale 2 puffs into the lungs every 4 (four) hours as needed for shortness of breath. PROAIR    . ALPRAZolam (XANAX) 1 MG tablet Take 1 mg by mouth 3 (three) times daily.     Marland Kitchen aspirin EC 325 MG EC tablet Take 1 tablet (325 mg total) by mouth daily. 60 tablet 0  . clopidogrel (PLAVIX) 75 MG tablet Take 75 mg by mouth daily.    . furosemide (LASIX) 20 MG tablet Take 20 mg by mouth daily as needed for edema.     . gabapentin (NEURONTIN) 600 MG  tablet Take 600 mg by mouth 3 (three) times daily.    . hydroxyurea (HYDREA) 500 MG capsule Take 500 mg by mouth 2 (two) times daily. May take with food to minimize GI side effects.    Marland Kitchen lisinopril (PRINIVIL,ZESTRIL) 20 MG tablet Take 20 mg by mouth daily.    . mirtazapine (REMERON) 45 MG tablet Take 45 mg by mouth at bedtime.   3  . nicotine (RA NICOTINE) 21 mg/24hr patch Place onto the skin.    Marland Kitchen nitroGLYCERIN (NITROSTAT) 0.4 MG SL tablet Place 0.4 mg under the tongue every 5 (five) minutes as needed for chest pain.    Marland Kitchen OLANZapine (ZYPREXA) 20 MG tablet Take 20 mg by mouth at bedtime.    . Oxycodone HCl 20 MG TABS Take 20 mg by mouth every 6 (six) hours.    . promethazine (PHENERGAN) 25 MG tablet Take 1 tablet (25 mg total) by mouth every 8 (eight) hours as needed for nausea. 40 tablet 0  . ranitidine (ZANTAC) 150 MG tablet Take 150 mg by mouth 3 (three) times daily.     Marland Kitchen tiZANidine (ZANAFLEX) 4 MG tablet Take 4 mg by mouth 3 (three) times daily.     Marland Kitchen venlafaxine XR (EFFEXOR-XR) 150 MG 24 hr capsule Take 150 mg by mouth at bedtime.   5  . zolpidem (AMBIEN) 10 MG tablet Take 10 mg by mouth at bedtime.      No current facility-administered medications for this visit.    Allergies as of 04/13/2015 - Review Complete 04/13/2015  Allergen Reaction Noted  . Latex Shortness Of Breath, Itching, Swelling, and Rash 11/26/2007  .  Lidocaine Anaphylaxis 01/28/2013  . Metoclopramide hcl Shortness Of Breath, Itching, Swelling, and Rash 11/26/2007  . Penicillins Shortness Of Breath, Itching, Swelling, and Rash 11/26/2007  . Phenothiazines Other (See Comments) 04/13/2015  . Fentanyl Itching and Rash 11/26/2007  . Hydromorphone hcl Itching and Rash 11/26/2007  . Iodinated diagnostic agents Rash 04/13/2015  . Iohexol Itching and Rash 08/10/2008  . Ketorolac tromethamine Itching and Rash 11/26/2007  . Ondansetron hcl Itching and Rash 11/26/2007  . Prochlorperazine edisylate Itching and Rash 11/26/2007    Vitals: BP 123/88 mmHg  Pulse 93  Ht 5\' 5"  (1.651 m)  Wt 214 lb 6.4 oz (97.251 kg)  BMI 35.68 kg/m2 Last Weight:  Wt Readings from Last 1 Encounters:  04/13/15 214 lb 6.4 oz (97.251 kg)   Last Height:   Ht Readings from Last 1 Encounters:  04/13/15 5\' 5"  (1.651 m)    Physical exam: Exam: Gen: NAD, conversant, obese  Eyes: Conjunctivae clear without exudates or hemorrhage  Neuro: Detailed Neurologic Exam  Speech:  Speech is normal; fluent and spontaneous with normal comprehension.  Cognition:  The patient is oriented to person, place, and time;  Cranial Nerves:  The pupils are equal, round, and reactive to light.  Visual fields are full to finger confrontation. Extraocular movements are intact. Trigeminal sensation is intact and the muscles of mastication are normal. The face is symmetric. The palate elevates in the midline. Hearing intact. Voice is normal. Shoulder shrug is normal. The tongue has normal motion without fasciculations.   Coordination: cant perform HTS due to pain  Gait:  Antalgic gait  Motor Observation:  No asymmetry, no atrophy, and no involuntary movements noted. Tone:  Normal muscle tone.   Posture:  Posture is normal. normal erect   Strength:  Strength is V/V in the upper extremities. She has poor effort and giveway in  the lowers  reportedly due to pain    Reflex Exam:  DTR's: absent patellars otherwise normal Toes:  The toes are downgoing bilaterally.  Clonus:  Clonus is absent.  Sensory level    Assessment/Plan: 49 y.o. female here as a referral from Dr. Effie Berkshire for numbness in the legs. 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 Numbness started in April. Patient had lumbar Surgery in June, lumbar fusion L5-S1. At last appointment she endorsed episodes of total numbness from the chest down to the toes with a thoracic sensory level. Today she reports slightly different history with pain in the low back and radiation down the back of the legs to the bottom of the feet. Denies thoracic sensory changes today. Symptoms resolved in between episodes. The episodes last several hours.   Mri cervical and thoracic spines with/without contrast were normal.  Will order an emg/ncs of the bilateral lower extremities. Can consider lumbar puncture if negative however symptoms are episodic which is unusual   Assessment/Plan:    Sarina Ill, MD  [facility] [address] [place], [place] [address]  Phone [phone] Fax [phone]  A total of 40 minutes was spent face-to-face with this patient. Over half this time was spent on counseling patient on the neuropathy diagnosis and different diagnostic and therapeutic options available.

## 2015-04-13 NOTE — Patient Instructions (Signed)
Remember to drink plenty of fluid, eat healthy meals and do not skip any meals. Try to eat protein with a every meal and eat a healthy snack such as fruit or nuts in between meals. Try to keep a regular sleep-wake schedule and try to exercise daily, particularly in the form of walking, 20-30 minutes a day, if you can.   As far as diagnostic testing: emg/ncs  I would like to see you back for emg/ncs, sooner if we need to. Please call us with any interim questions, concerns, problems, updates or refill requests.   Our phone number is [phone]. We also have an after hours call service for urgent matters and there is a physician on-call for urgent questions. For any emergencies you know to call 911 or go to the nearest emergency room   

## 2015-05-12 ENCOUNTER — Encounter: Payer: Medicare Other | Admitting: Neurology

## 2015-05-13 ENCOUNTER — Encounter: Payer: Self-pay | Admitting: Neurology

## 2015-05-25 ENCOUNTER — Ambulatory Visit (INDEPENDENT_AMBULATORY_CARE_PROVIDER_SITE_OTHER): Payer: Medicare Other | Admitting: Neurology

## 2015-05-25 ENCOUNTER — Ambulatory Visit (INDEPENDENT_AMBULATORY_CARE_PROVIDER_SITE_OTHER): Payer: Self-pay | Admitting: Neurology

## 2015-05-25 DIAGNOSIS — Z0289 Encounter for other administrative examinations: Secondary | ICD-10-CM

## 2015-05-25 DIAGNOSIS — M5417 Radiculopathy, lumbosacral region: Secondary | ICD-10-CM

## 2015-05-25 DIAGNOSIS — R200 Anesthesia of skin: Secondary | ICD-10-CM

## 2015-05-25 NOTE — Progress Notes (Signed)
See procedure note.

## 2015-05-25 NOTE — Progress Notes (Signed)
   [facility]    CC: Numbness in the legs  HPI 01/10/2015: [name] is a 50 y.o. female here as a referral from Dr. Effie Berkshire for numbness in the legs. MRi brain, MRi of the cervical and thoracic cords were all normal. 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 Numbness started in April. Patient had lumbar Surgery in June, lumbar fusion L5-S1. But in April she had an incident of legs totally going numb, she couldn't feel them from the waist down. She was hospitalized for 4 days. Since surgery she has had 3 falls with the same numbness from the waist to the toes bilaterally. The last time it happened was 3 weeks ago. She can't feel anything from the belly button to the legs, all the skin is involved in no dermatomal or nerve pattern. Sometimes she has dizziness and blurred vision. No loc or altered mentation when the symptoms happen. She goes numb in bed as well. The symptoms last 2-3 hours. It starts acutely, it resolves slowly sometimes other times it immediately goes away. No known triggers, she endorses lower extremity weakness as well. The symptoms feel completely resolved in-between episodes. The numbness is painful. Symptoms reaoslve completely in between episodes.   Summary  Nerve conduction studies were performed on the bilateral lower extremities:  The bilateral Peroneal motor nerves showed normal conductions with normal F Wave latencies The bilateral Tibial motor nerves showed normal conductions with normal F Wave latencies The bilateral Sural sensory nerve conductions were within normal limits The bilateral Superficial Peroneal sensory nerve conductions were within normal limits Bilateral H Reflexes showed normal latencies  EMG Needle study was performed on selected  left lower extremity muscles:   The  Gluteus Maximus and Gluteus Medius,  Biceps Femoris (long head), Vastus Medialis, Anterior Tibialis, Medial Gastrocnemius, Extensor Hallucis Longus muscles were within normal limits. Could not sample paraspinal muscles due to previous surgery.  Conclusion: This is a normal study.   Sarina Ill, MD  [facility] [address] [place], [place] [address]  Phone [phone] Fax [phone]

## 2015-05-25 NOTE — Procedures (Signed)
   [facility]    CC: Numbness in the legs  HPI 01/10/2015: [name] is a 50 y.o. female here as a referral from Dr. Effie Berkshire for numbness in the legs. MRi brain, MRi of the cervical and thoracic cords were all normal. 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 Numbness started in April. Patient had lumbar Surgery in June, lumbar fusion L5-S1. But in April she had an incident of legs totally going numb, she couldn't feel them from the waist down. She was hospitalized for 4 days. Since surgery she has had 3 falls with the same numbness from the waist to the toes bilaterally. The last time it happened was 3 weeks ago. She can't feel anything from the belly button to the legs, all the skin is involved in no dermatomal or nerve pattern. Sometimes she has dizziness and blurred vision. No loc or altered mentation when the symptoms happen. She goes numb in bed as well. The symptoms last 2-3 hours. It starts acutely, it resolves slowly sometimes other times it immediately goes away. No known triggers, she endorses lower extremity weakness as well. The symptoms feel completely resolved in-between episodes. The numbness is painful. Symptoms reaoslve completely in between episodes.   Summary  Nerve conduction studies were performed on the bilateral lower extremities:  The bilateral Peroneal motor nerves showed normal conductions with normal F Wave latencies The bilateral Tibial motor nerves showed normal conductions with normal F Wave latencies The bilateral Sural sensory nerve conductions were within normal limits The bilateral Superficial Peroneal sensory nerve conductions were within normal limits Bilateral H Reflexes showed normal latencies  EMG Needle study was performed on selected  left lower extremity muscles:   The  Gluteus Maximus and Gluteus Medius,  Biceps Femoris (long head), Vastus Medialis, Anterior Tibialis, Medial Gastrocnemius, Extensor Hallucis Longus muscles were within normal limits. Could not sample paraspinal muscles due to previous surgery.  Conclusion: This is a normal study.   Sarina Ill, MD  [facility] [address] [place], [place] [address]  Phone [phone] Fax [phone]

## 2015-12-02 ENCOUNTER — Encounter (HOSPITAL_COMMUNITY): Payer: Self-pay | Admitting: Radiology

## 2016-11-29 ENCOUNTER — Telehealth (INDEPENDENT_AMBULATORY_CARE_PROVIDER_SITE_OTHER): Payer: Self-pay | Admitting: Specialist

## 2016-11-29 NOTE — Telephone Encounter (Signed)
sched her for Tuesday

## 2016-11-29 NOTE — Telephone Encounter (Signed)
Patient called advised she is in a lot of pain and need to see Dr. Louanne Skye as soon as possible. Patient advised her back has been hurting since the surgery. Patient said she has fallen 6 or 7 times in 4 mnths. The number to contact patient is [phone]

## 2016-12-03 NOTE — Telephone Encounter (Signed)
Patient scheduled 12/04/16 at 1:45pm

## 2016-12-04 ENCOUNTER — Ambulatory Visit (INDEPENDENT_AMBULATORY_CARE_PROVIDER_SITE_OTHER): Payer: Medicare Other | Admitting: Specialist

## 2016-12-19 ENCOUNTER — Ambulatory Visit (INDEPENDENT_AMBULATORY_CARE_PROVIDER_SITE_OTHER): Payer: Medicare Other | Admitting: Specialist

## 2016-12-24 ENCOUNTER — Telehealth (INDEPENDENT_AMBULATORY_CARE_PROVIDER_SITE_OTHER): Payer: Self-pay | Admitting: Specialist

## 2016-12-24 NOTE — Telephone Encounter (Signed)
Patient called advised she need an appointment before October. Patient said she will see any other doctor.  The number to contact patient is [phone]

## 2017-01-01 NOTE — Telephone Encounter (Signed)
sched for 01/10/17 @ 945am

## 2017-01-10 ENCOUNTER — Ambulatory Visit (INDEPENDENT_AMBULATORY_CARE_PROVIDER_SITE_OTHER): Payer: Medicare Other | Admitting: Surgery

## 2017-02-11 ENCOUNTER — Telehealth (INDEPENDENT_AMBULATORY_CARE_PROVIDER_SITE_OTHER): Payer: Self-pay | Admitting: Orthopaedic Surgery

## 2017-02-11 NOTE — Telephone Encounter (Signed)
See message.

## 2017-02-11 NOTE — Telephone Encounter (Signed)
Please advise 

## 2017-02-11 NOTE — Telephone Encounter (Signed)
No I prefer not to see her if she's had surgery with Nitka already.  It would make more sense for her to see him.  Thanks.

## 2017-02-11 NOTE — Telephone Encounter (Signed)
Patient called this morning and wanted to know if she is unable to see Dr. Louanne Skye could see Dr. Erlinda Hong for her back.  Dr. Louanne Skye did the surgery.  I wanted to make sure it was okay with Dr. Erlinda Hong for me to put her on his schedule if she is unable to see Dr. Louanne Skye.  CB#[phone].  Thank you.

## 2017-02-14 ENCOUNTER — Ambulatory Visit (INDEPENDENT_AMBULATORY_CARE_PROVIDER_SITE_OTHER): Payer: Medicare Other | Admitting: Specialist

## 2017-02-18 ENCOUNTER — Telehealth (INDEPENDENT_AMBULATORY_CARE_PROVIDER_SITE_OTHER): Payer: Self-pay | Admitting: Specialist

## 2017-02-18 NOTE — Telephone Encounter (Signed)
Patient called asking for the dates of all her appointments back in 2017, I would give them to her but I don't have access to the old system anymore. CB # [phone]

## 2017-02-19 NOTE — Telephone Encounter (Signed)
Looking in the old system she was scheduled for 1 visit on 07/20/15 but did not keep that appt. Other wise she did have any other appointments with our office in 2017.---I tried to call her but her VM has not been set up. I will try again later.

## 2017-02-19 NOTE — Telephone Encounter (Signed)
I called and spoke with patient she wants me to call her back tomorrow afternoon to give her the dates from 2016

## 2017-02-21 ENCOUNTER — Ambulatory Visit (INDEPENDENT_AMBULATORY_CARE_PROVIDER_SITE_OTHER): Payer: Medicare Other | Admitting: Surgery

## 2017-03-01 NOTE — Telephone Encounter (Signed)
Mailed a list of Dates to patient

## 2017-07-18 ENCOUNTER — Ambulatory Visit (INDEPENDENT_AMBULATORY_CARE_PROVIDER_SITE_OTHER): Payer: Medicare Other | Admitting: Specialist

## 2017-08-22 ENCOUNTER — Ambulatory Visit (INDEPENDENT_AMBULATORY_CARE_PROVIDER_SITE_OTHER): Payer: Medicare Other | Admitting: Specialist

## 2017-08-22 ENCOUNTER — Encounter (INDEPENDENT_AMBULATORY_CARE_PROVIDER_SITE_OTHER): Payer: Self-pay | Admitting: Specialist

## 2017-08-22 ENCOUNTER — Ambulatory Visit (INDEPENDENT_AMBULATORY_CARE_PROVIDER_SITE_OTHER): Payer: Medicare Other

## 2017-08-22 VITALS — BP 114/77 | HR 78 | Ht 65.0 in | Wt 214.0 lb

## 2017-08-22 DIAGNOSIS — M4327 Fusion of spine, lumbosacral region: Secondary | ICD-10-CM | POA: Diagnosis not present

## 2017-08-22 DIAGNOSIS — M4807 Spinal stenosis, lumbosacral region: Secondary | ICD-10-CM | POA: Diagnosis not present

## 2017-08-22 DIAGNOSIS — M5441 Lumbago with sciatica, right side: Secondary | ICD-10-CM | POA: Diagnosis not present

## 2017-08-22 DIAGNOSIS — R202 Paresthesia of skin: Secondary | ICD-10-CM | POA: Diagnosis not present

## 2017-08-22 DIAGNOSIS — G8929 Other chronic pain: Secondary | ICD-10-CM | POA: Diagnosis not present

## 2017-08-22 DIAGNOSIS — M5442 Lumbago with sciatica, left side: Secondary | ICD-10-CM

## 2017-08-22 DIAGNOSIS — R200 Anesthesia of skin: Secondary | ICD-10-CM

## 2017-08-22 MED ORDER — DIAZEPAM 5 MG PO TABS
ORAL_TABLET | ORAL | 0 refills | Status: DC
Start: 2017-08-22 — End: 2022-09-03

## 2017-08-22 NOTE — Patient Instructions (Signed)
Avoid bending, stooping and avoid lifting weights greater than 10 lbs. Avoid prolong standing and walking. Avoid frequent bending and stooping  No lifting greater than 10 lbs. May use ice or moist heat for pain. Weight loss is of benefit. Handicap license is approved. Lumbar MRI without contrast in an open MRI.
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
## 2017-09-18 ENCOUNTER — Ambulatory Visit
Admission: RE | Admit: 2017-09-18 | Discharge: 2017-09-18 | Disposition: A | Discharge status: Home / Self Care | Payer: 59 | Source: Ambulatory Visit | Attending: Specialist | Admitting: Specialist

## 2017-09-18 DIAGNOSIS — M4807 Spinal stenosis, lumbosacral region: Secondary | ICD-10-CM

## 2017-09-27 ENCOUNTER — Encounter (INDEPENDENT_AMBULATORY_CARE_PROVIDER_SITE_OTHER): Payer: Self-pay | Admitting: Specialist

## 2017-09-27 ENCOUNTER — Ambulatory Visit (INDEPENDENT_AMBULATORY_CARE_PROVIDER_SITE_OTHER): Payer: 59 | Admitting: Specialist

## 2017-09-27 ENCOUNTER — Other Ambulatory Visit (INDEPENDENT_AMBULATORY_CARE_PROVIDER_SITE_OTHER): Payer: Self-pay | Admitting: Specialist

## 2017-09-27 VITALS — BP 101/68 | HR 78 | Ht 65.0 in | Wt 214.0 lb

## 2017-09-27 DIAGNOSIS — M4327 Fusion of spine, lumbosacral region: Secondary | ICD-10-CM

## 2017-09-27 DIAGNOSIS — M4726 Other spondylosis with radiculopathy, lumbar region: Secondary | ICD-10-CM | POA: Diagnosis not present

## 2017-09-27 MED ORDER — MELOXICAM 15 MG PO TABS: 15.0000 mg | ORAL_TABLET | Freq: Every day | ORAL | 3 refills | Status: DC
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
## 2017-09-27 NOTE — Telephone Encounter (Signed)
Meloxicam refill request

## 2017-09-27 NOTE — Patient Instructions (Signed)
Avoid bending, stooping and avoid lifting weights greater than 10 lbs. Avoid prolong standing and walking. Avoid frequent bending and stooping  No lifting greater than 10 lbs. May use ice or moist heat for pain. Weight loss is of benefit. Handicap license is approved. Dr. Newton's secretary/Assistant will call to arrange for lumbar facet steroid injection  

## 2017-11-13 ENCOUNTER — Ambulatory Visit (INDEPENDENT_AMBULATORY_CARE_PROVIDER_SITE_OTHER): Payer: 59 | Admitting: Specialist

## 2020-04-15 ENCOUNTER — Ambulatory Visit: Payer: 59 | Admitting: Specialist

## 2020-06-23 ENCOUNTER — Ambulatory Visit: Payer: 59 | Admitting: Specialist

## 2021-08-15 ENCOUNTER — Telehealth: Payer: Self-pay | Admitting: Specialist

## 2021-08-15 NOTE — Telephone Encounter (Signed)
Pt called requesting to make an appt. Last 3 appts for back pains was no shows. Explained to pt have to ask Dr. Louanne Skye and Alyse Low permission to set appt. Pt phone number is [phone]. Pt agreed ?

## 2021-08-23 NOTE — Telephone Encounter (Signed)
Per Dr. Louanne Skye: Madaline Brilliant to schedule her, BUT if she NO SHOWS then there will be no further appointments, there is no reason she can not pick up a phone to cancel if she can pick one up to schedule. ?

## 2021-08-24 ENCOUNTER — Telehealth: Payer: Self-pay | Admitting: Specialist

## 2021-08-24 NOTE — Telephone Encounter (Signed)
Called pt and was unable to leave vm. Pt called to set an appt and has several no shows. Per Dr. Louanne Skye states she is allowed to make an appt but if she no shows again he will discontinue service to pt. Please notify pt of this when calls to set appt. ?

## 2021-12-27 ENCOUNTER — Ambulatory Visit: Payer: 59 | Admitting: Specialist

## 2022-01-17 ENCOUNTER — Ambulatory Visit: Payer: 59 | Admitting: Specialist

## 2022-01-24 ENCOUNTER — Ambulatory Visit: Payer: 59 | Admitting: Specialist

## 2022-02-08 ENCOUNTER — Ambulatory Visit: Payer: Self-pay

## 2022-02-08 ENCOUNTER — Encounter: Payer: Self-pay | Admitting: Specialist

## 2022-02-08 ENCOUNTER — Ambulatory Visit (INDEPENDENT_AMBULATORY_CARE_PROVIDER_SITE_OTHER): Payer: 59 | Admitting: Specialist

## 2022-02-08 VITALS — BP 148/92 | HR 114 | Ht 65.0 in | Wt 214.0 lb

## 2022-02-08 DIAGNOSIS — M4316 Spondylolisthesis, lumbar region: Secondary | ICD-10-CM | POA: Diagnosis not present

## 2022-02-08 MED ORDER — GABAPENTIN 300 MG PO CAPS: 300.0000 mg | ORAL_CAPSULE | Freq: Every day | ORAL | 1 refills | Status: DC

## 2022-02-08 NOTE — Patient Instructions (Addendum)
Plan: Avoid bending, stooping and avoid lifting weights greater than 10 lbs. Avoid prolong standing and walking. Avoid frequent bending and stooping  No lifting greater than 10 lbs. May use ice or moist heat for pain. Weight loss is of benefit. MRI of the lumbar spine ordered Follow up with new spine specialist to consider undergoing extension of fusion to L4-5. While waiting for follow up with new spine surgeon need to work on improving medical illnessess, breathing, depression etc.  Gabapentin for neurogenic pain.
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
## 2022-03-01 ENCOUNTER — Ambulatory Visit: Payer: 59 | Admitting: Orthopedic Surgery

## 2022-03-06 ENCOUNTER — Ambulatory Visit: Payer: 59 | Admitting: Orthopedic Surgery

## 2022-04-04 ENCOUNTER — Encounter: Payer: Self-pay | Admitting: Orthopedic Surgery

## 2022-04-04 ENCOUNTER — Ambulatory Visit (INDEPENDENT_AMBULATORY_CARE_PROVIDER_SITE_OTHER): Payer: Medicaid Other | Admitting: Orthopedic Surgery

## 2022-04-04 ENCOUNTER — Ambulatory Visit (INDEPENDENT_AMBULATORY_CARE_PROVIDER_SITE_OTHER): Payer: Medicaid Other

## 2022-04-04 VITALS — BP 116/73 | HR 83 | Ht 65.0 in | Wt 214.0 lb

## 2022-04-04 DIAGNOSIS — M4316 Spondylolisthesis, lumbar region: Secondary | ICD-10-CM

## 2022-04-04 NOTE — Progress Notes (Signed)
Orthopedic Spine Surgery Office Note  Assessment: Patient is a 56 y.o. female with low back pain that radiates into the right thigh. Has spondylolisthesis above prior fusion.  Possible lumbar radiculopathy   Plan: -Explained that initially conservative treatment is tried as a significant number of patients may experience relief with these treatment modalities. Discussed that the conservative treatments include:  -activity modification  -physical therapy  -over the counter pain medications  -medrol dosepak  -lumbar steroid injections -An MRI was previously recommended and ordered by my former partner Dr. Louanne Skye. I agree with that recommendation and she needs to get this done. I provided her the phone number to call to schedule that MRI.  -Explained that she would need to quit smoking before any surgical intervention would be considered. She said she would work on quitting.  -Patient should return to office in 4 weeks, repeat x-rays of lumbar spine at next visit: None   Patient expressed understanding of the plan and all questions were answered to the patient's satisfaction.   ___________________________________________________________________________   History:  Patient is a 56 y.o. female who presents today for lumbar spine.  Patient has 5 to 6 months of low back pain that radiates into her right leg.  Feels ago from the lower back to the right buttock and right posterior lateral aspect of the right thigh.  No pain distal to the knee.  No pain on the left side.  Gets numbness and paresthesias in the same distribution as the pain on the right thigh.  No other numbness or paresthesias.  There is no trauma or injury that brought her pain.  She feels the pain on a daily basis and has gotten progressively worse.  She states it is slightly worse with activity but she still has considerable pain even at rest.   Weakness: Yes, feels her back is weaker.  No other weakness Symptoms of imbalance:  Denies Paresthesias and numbness: Yes, into right thigh.  No other numbness or paresthesias Bowel or bladder incontinence: Denies Saddle anesthesia: Denies  Treatments tried: Physical therapy, pain management, steroid injection, activity modification  Review of systems: Denies fevers and chills, night sweats, unexplained weight loss, history of cancer.  Has had pain that wakes her at night  Past medical history: GERD Breast cancer Bipolar Anxiety CAD Stroke HTN Kidney stones Sickle cell anemia Pancreatitis Sleep apnea   Allergies: latex, lidocaine, metoclopramide, penicllins, phenothiazine, fentanyl, hydromorphone, iodine, iohexol, ketorolac, ondansetron, prochlorperazine  Past surgical history:  Breast lumpectomy Cholecystectomy C Section L5/S1 PSIF and interbody fusion Mastectomy Tubal ligation  Social history: Reports use of nicotine product (smoking, vaping, patches, smokeless) - couple of cigarettes per day Alcohol use: denies Denies recreational drug use   Physical Exam:  General: no acute distress, appears stated age Neurologic: alert, answering questions appropriately, following commands Respiratory: unlabored breathing on room air, symmetric chest rise Psychiatric: appropriate affect, normal cadence to speech   MSK (spine):  -Strength exam      Left  Right EHL    4/5  4/5 TA    5/5  5/5 GSC    5/5  5/5 Knee extension  5/5  5/5 Hip flexion   5/5  5/5  -Sensory exam    Sensation intact to light touch in L3-S1 nerve distributions of bilateral lower extremities  -Achilles DTR: 1/4 on the left, 1/4 on the right -Patellar tendon DTR: 1/4 on the left, 1/4 on the right  -Straight leg raise: Negative -Contralateral straight leg raise: Negative -Clonus: no  beats bilaterally  -Right hip exam: Positive FADIR, no pain through remainder of range of motion at the hip, negative Stinchfield, negative Corky Sox -Right hip exam: No pain through range of  motion, negative Stinchfield, negative Faber  Imaging: XR of the lumbar spine from 02/08/2022 and 04/04/2022 was independently reviewed and interpreted, showing instrumentation into L5/S1 with interbody device in that level as well. Screws have not backed out. No lucency around the screws. Spondylolisthesis at L4/5 that measures about 37m and shifts 129mbetween flexion and extension views.   Patient name: Margaret GHERARDIatient MRN: [id number] of visit: 04/04/22

## 2022-04-17 ENCOUNTER — Other Ambulatory Visit: Payer: 59

## 2022-05-02 ENCOUNTER — Ambulatory Visit: Payer: 59 | Admitting: Orthopedic Surgery

## 2022-05-07 ENCOUNTER — Other Ambulatory Visit: Payer: 59

## 2022-05-07 ENCOUNTER — Telehealth: Payer: Self-pay | Admitting: Orthopedic Surgery

## 2022-05-09 ENCOUNTER — Telehealth: Payer: Self-pay | Admitting: Orthopedic Surgery

## 2022-05-09 ENCOUNTER — Ambulatory Visit (INDEPENDENT_AMBULATORY_CARE_PROVIDER_SITE_OTHER): Payer: Medicaid Other | Admitting: Orthopedic Surgery

## 2022-05-09 DIAGNOSIS — M5416 Radiculopathy, lumbar region: Secondary | ICD-10-CM

## 2022-05-09 NOTE — Progress Notes (Signed)
Orthopedic Spine Surgery Office Note  Assessment: Patient is a 57 y.o. female with low back pain that radiates into the right thigh. Has spondylolisthesis above prior fusion. Possible lumbar radiculopathy   Plan: -Explained that initially conservative treatment is tried as a significant number of patients may experience relief with these treatment modalities. Discussed that the conservative treatments include:  -activity modification  -physical therapy  -over the counter pain medications  -medrol dosepak  -lumbar steroid injections -Patient has tried Physical therapy, pain management, steroid injection, activity modification -She has had no relief with the above treatments and now is having falls as a result of her pain radiating to her right hip, so recommending MRI of the lumbar spine to evaluate for radiculopathy -Patient should return to office in 4 weeks, x-rays at next visit: none   Patient expressed understanding of the plan and all questions were answered to the patient's satisfaction.   ___________________________________________________________________________  History: Patient is a 57 y.o. female who has been previously seen in the office for symptoms concerning for lumbar radiculopathy. Since the last visit, symptoms have remained the same.  She still having significant back pain that radiates into the right lateral hip and anterior thigh.  She feels that on a daily basis.  There is not any particular activity that makes it better or worse at this point.  It seems to be better or worse depending on the day.  She tells me today that because of the pain her hip will give way and she has had 4 falls since her last visit.  She has no symptoms on the left side.  Denies paresthesias and numbness.  No change in bowel or bladder habits.  Has had difficulty doing her activities of daily living due to the pain.  Previous treatments: Physical therapy, pain management, steroid injection,  activity modification   COPY OF FIRST NOTE Patient is a 57 y.o. female who presents today for lumbar spine.  Patient has 5 to 6 months of low back pain that radiates into her right leg.  Feels ago from the lower back to the right buttock and right posterior lateral aspect of the right thigh.  No pain distal to the knee.  No pain on the left side.  Gets numbness and paresthesias in the same distribution as the pain on the right thigh.  No other numbness or paresthesias.  There is no trauma or injury that brought her pain.  She feels the pain on a daily basis and has gotten progressively worse.  She states it is slightly worse with activity but she still has considerable pain even at rest.     Weakness: Yes, feels her back is weaker.  No other weakness Symptoms of imbalance: Denies Paresthesias and numbness: Yes, into right thigh.  No other numbness or paresthesias Bowel or bladder incontinence: Denies Saddle anesthesia: Denies END OF COPY  Physical Exam:  General: no acute distress, appears stated age Neurologic: alert, answering questions appropriately, following commands Respiratory: unlabored breathing on room air, symmetric chest rise Psychiatric: appropriate affect, normal cadence to speech   MSK (spine):  -Strength exam      Left  Right EHL    4/5  4/5 TA    5/5  5/5 GSC    5/5  5/5 Knee extension  5/5  5/5 Hip flexion   5/5  4/5  -Sensory exam    Sensation intact to light touch in L3-S1 nerve distributions of bilateral lower extremities  -Achilles DTR: 1/4 on  the left, 1/4 on the right -Patellar tendon DTR: 1/4 on the left, 1/4 on the right  -Straight leg raise: negative -Contralateral straight leg raise: negative -Femoral nerve stretch test: negative -Clonus: no beats bilaterally  Imaging: XR of the lumbar spine from 02/08/2022 and 04/04/2022 was previously independently reviewed and interpreted, showing instrumentation into L5/S1 with interbody device in that  level as well. Screws have not backed out. No lucency around the screws. Spondylolisthesis at L4/5 that measures about 77m and shifts 122mbetween flexion and extension views.    Patient name: Margaret ROTHSCHILDatient MRN: [id number] of visit: 05/09/22

## 2022-05-09 NOTE — Telephone Encounter (Signed)
Patient was wanting to make sure her stuff was submitted the new info for her MRI

## 2022-05-09 NOTE — Telephone Encounter (Signed)
Please call patient ASAP. Regarding surgery and another issue..[phone]

## 2022-05-09 NOTE — Telephone Encounter (Signed)
I called and lmom for her to call us back and leave a detailed message with the front desk so I can discuss with Dr. Laurance Flatten and call her back.

## 2022-05-09 NOTE — Telephone Encounter (Signed)
Patient returning a call to [facility].

## 2022-05-16 ENCOUNTER — Telehealth: Payer: Self-pay | Admitting: Orthopedic Surgery

## 2022-05-16 ENCOUNTER — Other Ambulatory Visit: Payer: Self-pay | Admitting: Radiology

## 2022-05-16 DIAGNOSIS — M4316 Spondylolisthesis, lumbar region: Secondary | ICD-10-CM

## 2022-05-16 DIAGNOSIS — M5416 Radiculopathy, lumbar region: Secondary | ICD-10-CM

## 2022-05-16 NOTE — Telephone Encounter (Signed)
Patient wanting to know about her MRI

## 2022-05-16 NOTE — Telephone Encounter (Signed)
I put in a new referral-[facility] told patient that the referral was expired

## 2022-05-21 ENCOUNTER — Other Ambulatory Visit: Payer: 59

## 2022-05-24 ENCOUNTER — Ambulatory Visit
Admission: RE | Admit: 2022-05-24 | Discharge: 2022-05-24 | Disposition: A | Discharge status: Home / Self Care | Payer: Medicaid Other | Source: Ambulatory Visit | Attending: Orthopedic Surgery | Admitting: Orthopedic Surgery

## 2022-05-24 DIAGNOSIS — M4316 Spondylolisthesis, lumbar region: Secondary | ICD-10-CM

## 2022-05-24 DIAGNOSIS — M5416 Radiculopathy, lumbar region: Secondary | ICD-10-CM

## 2022-06-13 ENCOUNTER — Ambulatory Visit (INDEPENDENT_AMBULATORY_CARE_PROVIDER_SITE_OTHER): Payer: Medicaid Other | Admitting: Orthopedic Surgery

## 2022-06-13 ENCOUNTER — Encounter: Payer: Self-pay | Admitting: Orthopedic Surgery

## 2022-06-13 ENCOUNTER — Ambulatory Visit (INDEPENDENT_AMBULATORY_CARE_PROVIDER_SITE_OTHER): Payer: Medicaid Other

## 2022-06-13 DIAGNOSIS — M5416 Radiculopathy, lumbar region: Secondary | ICD-10-CM | POA: Diagnosis not present

## 2022-06-13 NOTE — Progress Notes (Addendum)
Orthopedic Spine Surgery Office Note  Assessment: Patient is a 57 y.o. 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 Her symptoms have gotten progressively worse over this time frame. I discussed a transforaminal injection as a possible treatment option but she was not interested, so discussed surgery as a treatment option for her. -Explained that she would need to be nicotine free before surgery. She said she is down to a couple of cigarettes a day so she will quit today. I informed her that she would get a urine test so she does need to quit today. She expressed understanding -Discussed surgery in the form of: L4/5 laminectomy, right sided foraminotomy, and extension of her prior fusion -Patient will next be seen at date of surgery  The patient has symptoms consistent with lumbar radiculopathy. The patient's symptoms were not getting improvement with conservative treatment so operative management was discussed in the form of L4/5 laminectomy, foraminotomy, and posterior instrumented spinal fusion. The risks including but not limited to dural tear, nerve root injury, paralysis, persistent pain, pseudarthrosis, infection, bleeding, hardware failure, adjacent segment disease, heart attack, death, stroke, fracture, blindness, and need for additional procedures were discussed with the patient. Explained that dural tear and infection risks are higher in her due to the fact that this is a revision surgery. The benefit of the surgery would be improvement in her leg pain. I explained that back pain is not reliably relieved with this surgery but due to the unstable nature of her spondylolisthesis, I think she can expect some improvement in her back pain. The alternatives to surgical management were covered  with the patient and included continued monitoring, physical therapy, over-the-counter pain medications, ambulatory aids, and activity modification. All the patient's questions were answered to her satisfaction. After this discussion, the patient expressed understanding and elected to proceed with surgical intervention.     ___________________________________________________________________________   History: Patient is a 57 y.o. female who has been previously seen in the office for symptoms consistent with lumbar radiculopathy. She has been having low back pain that radiates into her right thigh both anteriorly and posteriorly. She is not having any symptoms on the left side. Pain is worse with change in position or when she is active, but she does feel it constantly. She has been feeling the pain on a daily basis. She cannot do as much as she wants to do because of the pain. She has tried conservative treatments as listed below without any relief for over 6 months now. Denies paresthesias and numbness. No change in bowel or bladder habits. Denies saddle anesthesia.   Treatments tried: Physical therapy, tylenol, pain management, steroid injections, activity modification    Physical Exam:  BMI 33.1  General: no acute distress, appears stated age Neurologic: alert, answering questions appropriately, following commands Respiratory: unlabored breathing on room air, symmetric chest rise Psychiatric: appropriate affect, normal cadence to speech   MSK (spine):  -Strength exam      Left  Right EHL    4/5  4/5 TA    5/5  5/5 GSC    5/5  5/5 Knee extension  5/5  5/5 Hip flexion   5/5  4+/5  -Sensory exam    Sensation intact to light touch in L3-S1 nerve distributions of bilateral lower extremities  -Achilles DTR: 1/4 on the  left, 1/4 on the right -Patellar tendon DTR: 1/4 on the left, 1/4 on the right  -Straight leg raise: negative -Contralateral straight leg raise: negative -Femoral  nerve stretch test: negative -Clonus: no beats bilaterally  -Left hip exam: no pain through range of motion, negative FADIR, negative FABER, negative stinchfield -Right hip exam: mild pain with FADIR, no pain through remainder of range of motion at the hip, negative FABER, negative stinchfield   Imaging: XR of the lumbar spine from 02/08/2022 and 04/04/2022 was previously independently reviewed and interpreted, showing instrumentation into L5/S1 with interbody device in that level as well. Screws have not backed out. No lucency around the screws. Spondylolisthesis at L4/5 that measures about 8mm and shifts 1mm between flexion and extension views.  PI of 67, LL of 59.  MRI of the lumbar spine from 05/24/2022 was independently reviewed and interpreted, showing L4/5 foraminal stenosis bilaterally. Lateral recess stenosis at L4/5. Spondylolisthesis at L4/5 appears nearly reduced - measures 1.62mm. L5/S1 has prior instrumentation but MRI not the best modality to evaluate. No stenosis seen at L5/S1.   Patient name: [name] Patient MRN: [id number] Date of visit: 06/13/22

## 2022-06-13 NOTE — Progress Notes (Signed)
Preoperative Scores  ODI: 52 VAS leg: 9 VAS back: 10 SF-36:  -Physical functioning: 20  -Role limitations due to physical health: 0  -Role limitations due to emotional problems: 0  -Energy/fatigue: 25  -Emotional well-being: 28  -Social functioning: 12.5  -Pain: 22.5  -General health: [place], MD Orthopedic Surgeon

## 2022-06-20 ENCOUNTER — Telehealth: Payer: Self-pay | Admitting: Orthopedic Surgery

## 2022-06-20 NOTE — Telephone Encounter (Signed)
Patient requests a call to discuss surgery, and having to wait 6 weeks before she can schedule surgery due to smoking cession.

## 2022-08-06 ENCOUNTER — Encounter (HOSPITAL_COMMUNITY): Payer: Self-pay

## 2022-08-06 ENCOUNTER — Encounter (HOSPITAL_COMMUNITY)
Admission: RE | Admit: 2022-08-06 | Discharge: 2022-08-06 | Disposition: A | Discharge status: Home / Self Care | Payer: Medicare (Managed Care) | Source: Ambulatory Visit | Attending: Orthopedic Surgery | Admitting: Orthopedic Surgery

## 2022-08-06 VITALS — BP 149/87 | HR 74 | Temp 98.1°F | Resp 18 | Ht 63.0 in | Wt 202.6 lb

## 2022-08-06 DIAGNOSIS — D563 Thalassemia minor: Secondary | ICD-10-CM | POA: Diagnosis not present

## 2022-08-06 DIAGNOSIS — I10 Essential (primary) hypertension: Secondary | ICD-10-CM | POA: Diagnosis not present

## 2022-08-06 DIAGNOSIS — J45909 Unspecified asthma, uncomplicated: Secondary | ICD-10-CM | POA: Diagnosis not present

## 2022-08-06 DIAGNOSIS — Z01818 Encounter for other preprocedural examination: Secondary | ICD-10-CM | POA: Diagnosis present

## 2022-08-06 DIAGNOSIS — I2510 Atherosclerotic heart disease of native coronary artery without angina pectoris: Secondary | ICD-10-CM | POA: Insufficient documentation

## 2022-08-06 DIAGNOSIS — Z87891 Personal history of nicotine dependence: Secondary | ICD-10-CM | POA: Insufficient documentation

## 2022-08-06 DIAGNOSIS — M4316 Spondylolisthesis, lumbar region: Secondary | ICD-10-CM | POA: Insufficient documentation

## 2022-08-06 DIAGNOSIS — Z853 Personal history of malignant neoplasm of breast: Secondary | ICD-10-CM | POA: Diagnosis not present

## 2022-08-06 DIAGNOSIS — M5416 Radiculopathy, lumbar region: Secondary | ICD-10-CM | POA: Diagnosis not present

## 2022-08-06 DIAGNOSIS — Z8673 Personal history of transient ischemic attack (TIA), and cerebral infarction without residual deficits: Secondary | ICD-10-CM | POA: Insufficient documentation

## 2022-08-06 DIAGNOSIS — G4733 Obstructive sleep apnea (adult) (pediatric): Secondary | ICD-10-CM | POA: Insufficient documentation

## 2022-08-06 HISTORY — DX: Other complications of anesthesia, initial encounter: T88.59XA

## 2022-08-06 HISTORY — DX: COVID-19: U07.1

## 2022-08-06 HISTORY — DX: Unspecified asthma, uncomplicated: J45.909

## 2022-08-06 HISTORY — DX: Bronchitis, not specified as acute or chronic: J40

## 2022-08-06 HISTORY — DX: Pneumonia, unspecified organism: J18.9

## 2022-08-06 HISTORY — DX: Personal history of urinary calculi: Z87.442

## 2022-08-06 LAB — SURGICAL PCR SCREEN
MRSA, PCR: NEGATIVE
Staphylococcus aureus: NEGATIVE

## 2022-08-06 NOTE — Progress Notes (Addendum)
Surgical Instructions   Your procedure is scheduled on Monday, 08/13/22 at 7:30 AM.  Report to [facility] Main Entrance "A" at 5:30 AM, then check in with the Admitting office.  Call this number if you have problems the morning of surgery:  [phone]   If you have any questions prior to your surgery date call [phone]: Open Monday-Friday 8am-4pm If you experience any cold or flu symptoms such as cough, fever, chills, shortness of breath, etc. between now and your scheduled surgery, please notify us at the above number    Remember:  Do not eat after midnight the night before your surgery  You may drink clear liquids until  the morning of your surgery.   Clear liquids allowed are: Water, Non-Citrus Juices (without pulp), Carbonated Beverages, Clear Tea, Black Coffee ONLY (NO MILK, CREAM OR POWDERED CREAMER of any kind), and Gatorade   Please complete your PRE-SURGERY ENSURE that was provided to you by ... the morning of surgery.  Please, if able, drink it in one setting. DO NOT SIP.    Take these medicines the morning of surgery with A SIP OF WATER: Abilify, Alprazolam (Xanax), Buspirone (Buspar), Gabapentin (Neurontin),  Oxycodone, Ranitidine (Zantac), Venlafaxine XR (Effexor-XR), Promethazine (Phenergan) - if needed, Albuterol inhaler - prn (bring day of surgery)  As of today, STOP taking any Aspirin (unless otherwise instructed by your surgeon) Aleve, Naproxen, Ibuprofen, Motrin, Advil, Goody's, BC's, all herbal medications, fish oil, and all vitamins.  [facility] is not responsible for any belongings or valuables.    Do NOT Smoke (Tobacco/Vaping)  24 hours prior to your procedure  If you use a CPAP at night, you may bring your mask for your overnight stay.   Contacts, glasses, hearing aids, dentures or partials may not be worn into surgery, please bring cases for these belongings   For patients admitted to the hospital, discharge time will be determined by your treatment  team.   Patients discharged the day of surgery will not be allowed to drive home, and someone needs to stay with them for 24 hours.  SURGICAL WAITING ROOM VISITATION Patients having surgery or a procedure may have no more than 2 support people in the waiting area - these visitors may rotate.   Children under the age of 69 must have an adult with them who is not the patient. If the patient needs to stay at the hospital during part of their recovery, the visitor guidelines for inpatient rooms apply. Pre-op nurse will coordinate an appropriate time for 1 support person to accompany patient in pre-op.  This support person may not rotate.   Please refer to [email or web address] for the visitor guidelines for Inpatients (after your surgery is over and you are in a regular room).   Special instructions:    Oral Hygiene is also important to reduce your risk of infection.  Remember - BRUSH YOUR TEETH THE MORNING OF SURGERY WITH YOUR REGULAR TOOTHPASTE  - Preparing For Surgery  Before surgery, you can play an important role. Because skin is not sterile, your skin needs to be as free of germs as possible. You can reduce the number of germs on your skin by washing with CHG (chlorahexidine gluconate) Soap before surgery.  CHG is an antiseptic cleaner which kills germs and bonds with the skin to continue killing germs even after washing.    Please do not use if you have an allergy to CHG or antibacterial soaps. If your skin becomes reddened/irritated stop using the  CHG.  Do not shave (including legs and underarms) for at least 48 hours prior to first CHG shower. It is OK to shave your face.  Please follow these instructions carefully.    Shower the NIGHT BEFORE SURGERY and the MORNING OF SURGERY with CHG Soap.   If you chose to wash your hair, wash your hair first as usual with your normal shampoo. After you shampoo, rinse your hair and body  thoroughly to remove the shampoo.  Then [employer] and genitals (private parts) with your normal soap and rinse thoroughly to remove soap.  After that Use CHG Soap as you would any other liquid soap. You can apply CHG directly to the skin and wash gently with a scrungie or a clean washcloth.   Apply the CHG Soap to your body ONLY FROM THE NECK DOWN.  Do not use on open wounds or open sores. Avoid contact with your eyes, ears, mouth and genitals (private parts). Wash Face and genitals (private parts)  with your normal soap.   Wash thoroughly, paying special attention to the area where your surgery will be performed.  Thoroughly rinse your body with warm water from the neck down.  DO NOT shower/wash with your normal soap after using and rinsing off the CHG Soap.  Pat yourself dry with a CLEAN TOWEL.  Wear CLEAN PAJAMAS to bed the night before surgery  Place CLEAN SHEETS on your bed the night before your surgery  DO NOT SLEEP WITH PETS.  Day of Surgery: Take a shower with CHG soap. Wear Clean/Comfortable clothing the morning of surgery Do not wear jewelry or makeup. Do not wear lotions, powders, perfumes/cologne or deodorant. Do not shave 48 hours prior to surgery.  Do not bring valuables to the hospital. Do not wear nail polish, gel polish, artificial nails, or any other type of covering on natural nails (fingers and toes) If you have artificial nails or gel coating that need to be removed by a nail salon, please have this removed prior to surgery. Artificial nails or gel coating may interfere with anesthesia's ability to adequately monitor your vital signs.  Please read over the fact sheets that you were given.

## 2022-08-06 NOTE — Progress Notes (Addendum)
PCP - Dr. Chana Bode [facility] Cardiologist -doesn't remember who she has seen, has not seen anyone for over 2 years.   EKG - 08/06/22 Stress Test - 02/07/21 ECHO - 20115 Cardiac Cath - none  Sleep Apnea hx CPAP - no  ERAS Protcol - yes, no drink  Requesting Lab work from [employer] done last week.  Anesthesia review: yes hx of Sickle cell anemia,HTN, ?CAD (no significant coronary calcifications noted on PET Rubiduium Myocardial perfusion, has had several Echos over the years   Patient denies shortness of breath, fever, cough and chest pain at PAT appointment   All instructions explained to the patient, with a verbal understanding of the material. Patient agrees to go over the instructions while at home for a better understanding. Patient also instructed to self quarantine after being tested for COVID-19. The opportunity to ask questions was provided.

## 2022-08-07 ENCOUNTER — Encounter (HOSPITAL_COMMUNITY): Payer: Self-pay | Admitting: Certified Registered Nurse Anesthetist

## 2022-08-07 ENCOUNTER — Encounter (HOSPITAL_COMMUNITY): Payer: Self-pay | Admitting: Vascular Surgery

## 2022-08-07 NOTE — Progress Notes (Addendum)
Pt called me yesterday afternoon asking if I would check in the Admissions office to see if they had her Medicare and Medicaid cards. I went over to check and Clydie Braun in Admissions looked but didn't find any. I went back this morning to check again and no one has seen them. Jkayla called back again today and asked if I found them and I told her no. I gave her the number to Admissions so she can call them. She also wanted to let me know that her blood work has been resulted and that her potassium is low. She stated that Dr. Barron Alvine has called in Potassium pills for her but wanted [place] to fax him the EKG that was done yesterday. I have faxed that to him at [phone] and asked that his office faxes [place] the lab work.    I asked Dorinne if she was safe at this time and she stated yes and that she has talked with a counselor and she has a found a safe place to live starting before surgery.

## 2022-08-07 NOTE — Progress Notes (Signed)
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  History includes former smoker (quit 06/13/22),somatization disorder, CVA (TPA for CVA like event 2010 & 08/2019 with no acute infarct by 09/07/19 MRI), HTN, CAD (previously reported possible PCI in 2002; normal coronaries and no stent visualized 07/16/08 [facility]), pancreatitis, asthma, OSA (does not use CPAP), breast cancer (s/p surgery), hiatal hernia, Sickle cell with Beta thalassemia trait, spinal surgery (L5-S1 fusion 10/26/14; spinal cord stimulator 02/15/16, removal 03/28/17). Reported "slow to wake up" after anesthesia (prolonged emergence).  BMI is consistent with obesity.   She has a history of homelessness--per PAT RN note, patient reported that she had talked with a counselor an has found a safe place to live starting before her surgery.  She had labs on 08/03/22 (see LABS). Copy on shadow chart. Potassium was 3.3. She reported that PCP Dr. Barron Alvine supplement her with KCl. He also requested copy of EKG done at PAT, as he is evaluating her for preoperative clearance/risk assessment. EKG was faxed to his office for his review. Will await PCP input. In the interim, reviewed above with anesthesiologist Achille Rich, MD. If not acute changes, then will defer additional preoperative recommendations, if any, to Dr. Barron Alvine.  Office to fax clearance once received.    VS: BP (!) 149/87   Pulse 74   Temp 36.7 C   Resp 18   Ht 5\' 3"  (1.6 m)   Wt 91.9 kg   SpO2 100%   BMI 35.89 kg/m   PROVIDERS: Tania Ade Lenny Pastel, NP Chana Bode, DO is PCP ([facility]) She previously saw cardiologist Dr. Rico Junker at [facility] in [place], [place], last visit likely ~ 2017 or 2018.   LABS: She had labs on 08/03/22 at [facility] including CBC with diff, CMP, Vit D, Thyroid panel, Lipid profile, A1c. Results included A1c 6.0%, TSH 0.88, free T40.80, total T31.01, vitamin D 7 (L), BUN 15, creatinine 0.57, sodium 140, potassium 3.3, glucose 103, albumin 3.6, AST 12, ALT 11, magnesium 1.9, WBC 6.4, hemoglobin 10.8, hematocrit 32.9, platelet count 300. Labs are on shadow chart. (all labs ordered are listed, but only abnormal results are displayed)  Labs Reviewed  SURGICAL PCR SCREEN    IMAGES: MRI L-spine 05/24/22: IMPRESSION: 1. Stable chronic decompression and fusion at L5-S1. Progressed Adjacent segment disease at L4-L5 since 2019 including subtle new anterolisthesis, increased disc and posterior element degeneration. Increased moderate left and moderate to severe right neural foraminal stenosis. Query L4 radiculitis. 2. Elsewhere normal for age MRI appearance of the lumbar spine. No acute or suspicious osseous lesion identified.   EKG: 08/06/22: Normal sinus rhythm Possible Left atrial enlargement When compared with ECG of 17-Aug-2014 17:16, baseline artifact not present otherwise no significant change. Confirmed by Tessa Lerner [phone]) on 08/07/2022 1:23:57 AM   CV: Echo (Limited) 02/07/21 [facility]): Summary   1. Technically difficult study.    2. The left ventricle is normal in size with normal wall thickness.    3. The left ventricular systolic function is normal, LVEF is visually  estimated at 65-70%.  4. The right ventricle is normal in size, with normal systolic function.    5. Agitated saline study is negative.    US Carotid 02/07/21 [facility]): IMPRESSION: Less than 50% stenosis of the internal carotid arteries.    Echo (Complete) 09/08/19 ([facility]): Left Atrium: Injection of agitated saline  documents no interatrial  shunt..   Left Ventricle: Systolic function is normal. EF: 60-65%.    Nuclear stress test 05/20/2014 [facility]): -Rest only nuclear stress test showing normal perfusion in the resting exam. Stress again not completed. [Stress not completed due to patient refusal.]  - Per prior anesthesia APP review in in 03/2015, she later had preoperative cardiology evaluation by Dr. Rico Junkerraig Turner at [facility] in [place] and he did not think stress test needed to be repeated given absence of CV symptoms, reassuring EKG, and prior normal stress test in 2013.    Cardiac cath 07/16/08 [facility](Jonathan Berry, MD): SELECTIVE CORONARY ANGIOGRAPHY:  1. Left main normal.  2. LAD normal.  3. Left circumflex normal.  4. Right coronary was dominant and normal.  5. Left ventriculography; RAO left ventriculogram was performed using      25 mL of Visipaque dye at 12 mL per second.  The overall LVEF was      estimated greater than 60% without focal wall motion abnormalities.   IMPRESSION:  Ms. Fraser Dinreston has normal coronary arteries, normal left ventricular function.  I cannot visualize a stent and wonder whether this was ever done.  Her chest pain is clearly noncardiac.     Past Medical History:  Diagnosis Date   Acid reflux    Anxiety    Arthritis    Asthma    Beta thalassemia trait    Bipolar disorder    Breast cancer    breast cancer on right--mastectomy and has implant   Bronchitis    Complication of anesthesia    slow to wake up   Coronary artery disease    states she's been told she has some blockages, but no stenting needed.Pt doesn't remember when this was done.   COVID    January or February - 2024, mild case   CVA (cerebral infarction)    Depression    Headache    History of hiatal hernia    History of kidney stones    Hypertension    N&V (nausea and vomiting) 12/22/2009   Pancreatitis    Pneumonia    Shortness of breath dyspnea    states  she does use inhaler, but has never been dx with asthma or bronchitis.  she does smoke however   Sickle cell anemia    Sleep apnea    tested more than 5-6 yrs ago.  Unable to get mask d/t lack of insurance at the time   Somatization disorder    Stroke    years ago.  no deficits now   Wears glasses     Past Surgical History:  Procedure Laterality Date   BREAST LUMPECTOMY     CARDIAC CATHETERIZATION     CESAREAN SECTION     CHOLECYSTECTOMY     COLONOSCOPY W/ BIOPSIES AND POLYPECTOMY     LUMBAR FUSION N/A 10/26/2014   Procedure: LUMBAR FUSION L5-S1 WITH PEDICLE SCREWS, RODS, CAGE, VIVIGEN, ALLOGRAFT BONE GRAFT;  Surgeon: Kerrin ChampagneJames E Nitka, MD;  Location: [facility];  Service: Orthopedics;  Laterality: N/A;   MASTECTOMY     right with implant   PARTIAL HYSTERECTOMY  PORT A CATH REVISION     states she's had it for several yrs for sickle cell.   RADIOLOGY WITH ANESTHESIA N/A 03/08/2015   Procedure: MRI THORAIC SPINE   (RADIOLOGY WITH ANESTHESIA);  Surgeon: Medication Radiologist, MD;  Location: [facility];  Service: Radiology;  Laterality: N/A;   TUBAL LIGATION      MEDICATIONS:  amitriptyline (ELAVIL) 50 MG tablet   aripiprazole (ABILIFY) 15 MG disintegrating tablet   busPIRone (BUSPAR) 10 MG tablet   QUEtiapine (SEROQUEL) 100 MG tablet   albuterol (PROVENTIL HFA;VENTOLIN HFA) 108 (90 BASE) MCG/ACT inhaler   ALPRAZolam (XANAX) 1 MG tablet   aspirin EC 325 MG EC tablet   clopidogrel (PLAVIX) 75 MG tablet   diazepam (VALIUM) 5 MG tablet   furosemide (LASIX) 20 MG tablet   gabapentin (NEURONTIN) 300 MG capsule   gabapentin (NEURONTIN) 600 MG tablet   hydroxyurea (HYDREA) 500 MG capsule   lisinopril (PRINIVIL,ZESTRIL) 20 MG tablet   meloxicam (MOBIC) 15 MG tablet   mirtazapine (REMERON) 45 MG tablet   nicotine (RA NICOTINE) 21 mg/24hr patch   nitroGLYCERIN (NITROSTAT) 0.4 MG SL tablet   OLANZapine (ZYPREXA) 20 MG tablet   Oxycodone HCl 20 MG TABS   promethazine (PHENERGAN) 25 MG tablet    ranitidine (ZANTAC) 150 MG tablet   tiZANidine (ZANAFLEX) 4 MG tablet   venlafaxine XR (EFFEXOR-XR) 150 MG 24 hr capsule   zolpidem (AMBIEN) 10 MG tablet   No current facility-administered medications for this encounter.   - She reported that she is no longer taking ASA, Plavix, Valium, Lasix, hydroxyurea, lisinopril, Mobic, Remeron, Nitro, Zyprexa, Zanaflex, or Neurontin.   Shonna Chock, PA-C Surgical Short Stay/Anesthesiology [facility] Phone [phone] [facility] Phone [phone] 08/07/2022 5:00 PM

## 2022-08-07 NOTE — Progress Notes (Signed)
PCP - Dr. Chana Bode [facility] Cardiologist -doesn't remember who she has seen, has not seen anyone for over 2 years.   EKG - 08/06/22 Stress Test - 02/07/21 ECHO - 20115 Cardiac Cath - none  Sleep Apnea hx CPAP - no  ERAS Protcol - yes, no drink  Requesting Lab work from [employer] done last week.  Anesthesia review: yes hx of Sickle cell anemia,HTN, has had several Echos over the years   Patient denies shortness of breath, fever, cough and chest pain at PAT appointment   All instructions explained to the patient, with a verbal understanding of the material. Patient agrees to go over the instructions while at home for a better understanding. Patient also instructed to self quarantine after being tested for COVID-19. The opportunity to ask questions was provided.

## 2022-08-08 NOTE — Anesthesia Preprocedure Evaluation (Deleted)
Anesthesia Evaluation    Airway        Dental   Pulmonary former smoker          Cardiovascular hypertension,      Neuro/Psych    GI/Hepatic   Endo/Other    Renal/GU      Musculoskeletal   Abdominal   Peds  Hematology   Anesthesia Other Findings   Reproductive/Obstetrics                             Anesthesia Physical Anesthesia Plan  ASA:   Anesthesia Plan:    Post-op Pain Management:    Induction:   PONV Risk Score and Plan:   Airway Management Planned:   Additional Equipment:   Intra-op Plan:   Post-operative Plan:   Informed Consent:   Plan Discussed with:   Anesthesia Plan Comments: (PAT note written 08/08/2022 by Shonna Chock, PA-C. 08/03/22 labs on shadow chart.   )       Anesthesia Quick Evaluation

## 2022-08-13 ENCOUNTER — Encounter (HOSPITAL_COMMUNITY): Admission: RE | Payer: Self-pay | Source: Home / Self Care

## 2022-08-13 ENCOUNTER — Ambulatory Visit (HOSPITAL_COMMUNITY): Admission: RE | Admit: 2022-08-13 | Payer: Medicaid Other | Source: Home / Self Care | Admitting: Orthopedic Surgery

## 2022-08-13 ENCOUNTER — Telehealth: Payer: Self-pay | Admitting: Orthopedic Surgery

## 2022-08-13 ENCOUNTER — Encounter (HOSPITAL_COMMUNITY): Payer: Self-pay | Admitting: Orthopedic Surgery

## 2022-08-13 DIAGNOSIS — M4316 Spondylolisthesis, lumbar region: Secondary | ICD-10-CM

## 2022-08-13 SURGERY — POSTERIOR LUMBAR FUSION 1 LEVEL
Anesthesia: General

## 2022-08-13 MED ORDER — FENTANYL CITRATE (PF) 250 MCG/5ML IJ SOLN
INTRAMUSCULAR | Status: AC
  Filled 2022-08-13: qty 5

## 2022-08-13 MED ORDER — ONDANSETRON HCL 4 MG/2ML IJ SOLN
INTRAMUSCULAR | Status: AC
  Filled 2022-08-13: qty 2

## 2022-08-13 MED ORDER — PROPOFOL 10 MG/ML IV BOLUS
INTRAVENOUS | Status: AC
  Filled 2022-08-13: qty 20

## 2022-08-13 MED ORDER — MIDAZOLAM HCL 2 MG/2ML IJ SOLN
INTRAMUSCULAR | Status: AC
  Filled 2022-08-13: qty 2

## 2022-08-13 MED ORDER — DEXAMETHASONE SODIUM PHOSPHATE 10 MG/ML IJ SOLN
INTRAMUSCULAR | Status: AC
  Filled 2022-08-13: qty 1

## 2022-08-13 MED ORDER — SUCCINYLCHOLINE CHLORIDE 200 MG/10ML IV SOSY
PREFILLED_SYRINGE | INTRAVENOUS | Status: AC
  Filled 2022-08-13: qty 10

## 2022-08-13 MED ORDER — ROCURONIUM BROMIDE 10 MG/ML (PF) SYRINGE
PREFILLED_SYRINGE | INTRAVENOUS | Status: AC
  Filled 2022-08-13: qty 10

## 2022-08-13 MED ORDER — PROPOFOL 1000 MG/100ML IV EMUL
INTRAVENOUS | Status: AC
  Filled 2022-08-13: qty 100

## 2022-08-13 MED ORDER — LIDOCAINE 2% (20 MG/ML) 5 ML SYRINGE
INTRAMUSCULAR | Status: AC
  Filled 2022-08-13: qty 5

## 2022-08-13 NOTE — Telephone Encounter (Signed)
Patient rescheduled surgery for 08/31/22. Patient is due to get a refill on her pain medication on 09/07/22, and wanted to know if Dr. Christell Constant would be prescribing any pain medication post surgery? Patient wants to make sure she will have medication fo after surgery. Please cal lto advise.

## 2022-08-13 NOTE — Telephone Encounter (Signed)
I tried to call but both numbers were unable to complete my calls. I will try again later

## 2022-08-14 NOTE — Telephone Encounter (Signed)
Patient called back to find out about medication. I advised patient of Dr. Christell Constant taking over pain medication for 6 weeks post op then patient returning to pain management after that time period. Patient understood. Patient's phone number has been updated as well.

## 2022-08-14 NOTE — Telephone Encounter (Signed)
Tried again to call but neither number is working---Dr. Christell Constant will take over her pain meds for 6 weeks postop ad then she will back to pain management. She will have pain meds after surgery.

## 2022-08-23 ENCOUNTER — Telehealth: Payer: Self-pay | Admitting: Orthopedic Surgery

## 2022-08-23 MED ORDER — CHLORHEXIDINE GLUCONATE 2 % EX LIQD: 40.0000 mL | Freq: Every day | CUTANEOUS | 0 refills | Status: AC

## 2022-08-23 MED ORDER — ENSURE PRE-SURGERY PO LIQD: 296.0000 mL | Freq: Once | ORAL | 0 refills | Status: AC

## 2022-08-23 MED ORDER — MUPIROCIN 2 % EX OINT: 1.0000 | TOPICAL_OINTMENT | Freq: Two times a day (BID) | CUTANEOUS | 0 refills | Status: AC

## 2022-08-23 NOTE — Telephone Encounter (Signed)
Pt called requesting call back. Pt spoke with pre admission and they informed pt to call to put another order in for disinfected soap and ensure to drink before surgery May 3. Please call pt about this matter at [phone]

## 2022-08-23 NOTE — Telephone Encounter (Signed)
Vonna Kotyk (Pharmacist )  from [facility] stated Dr Christell Constant sent in script for 2% chlorhexidine and they only have $5 in stock and is it ok to switch to 4%. Please call Vonna Kotyk

## 2022-08-23 NOTE — Telephone Encounter (Signed)
I called and advised her that this has been ordered and she will need to contact preadmission to find out when to pick it up

## 2022-08-24 NOTE — Telephone Encounter (Signed)
I called pharmacy and the auth to change it to 4%

## 2022-08-27 ENCOUNTER — Encounter: Payer: Self-pay | Admitting: Orthopedic Surgery

## 2022-08-30 NOTE — Progress Notes (Signed)
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  Case was initially scheduled for 08/13/22 but was moved to 08/31/22 for unknown reasons. See my previously Anesthesia APP note from Encounter Date 08/06/22.   Copies of last office note from PCP Chana Bode, DO in [place], 08/03/22 labs, and 08/06/22 EKG (that was faxed from her PAT visit) received from Dr. Philomena Doheny office. Within note, he wrote, "Pt is cleared for surgery."   08/03/22 labs included CBC with diff, CMP, Vit D, Thyroid panel, Lipid profile, A1c. Results included A1c 6.0%, TSH 0.88, free T40.80, total T31.01, vitamin D 7 (L), BUN 15, creatinine 0.57, sodium 140, potassium 3.3, glucose 103, albumin 3.6, AST 12, ALT 11, magnesium 1.9, WBC 6.4, hemoglobin 10.8, hematocrit 32.9, platelet count 300.   EKG 08/06/22: Normal sinus rhythm Possible Left atrial enlargement When compared with ECG of 17-Aug-2014 17:16, baseline artifact not present otherwise no significant change. Confirmed by Tessa Lerner [phone]) on 08/07/2022 1:23:57 AM   Echo (Limited) 02/07/21 [facility]): Summary   1. Technically difficult study.    2. The left ventricle is normal in size with normal wall thickness.    3. The left ventricular systolic function is normal, LVEF is visually  estimated at 65-70%.    4. The right ventricle is normal in size, with normal systolic function.    5. Agitated saline study is negative.    Additional studies outlined in my previous note.  Anesthesia team to evaluate on the day of surgery.    Shonna Chock, PA-C Surgical Short  Stay/Anesthesiology [facility] Phone [phone] [facility] Phone [phone] 08/30/2022 9:52 AM

## 2022-08-30 NOTE — Anesthesia Preprocedure Evaluation (Addendum)
Anesthesia Evaluation  Patient identified by MRN, date of birth, ID band Patient awake    Reviewed: Allergy & Precautions, NPO status , Patient's Chart, lab work & pertinent test results  History of Anesthesia Complications Negative for: history of anesthetic complications  Airway Mallampati: II  TM Distance: >3 FB Neck ROM: Full    Dental  (+) Edentulous Lower, Missing,    Pulmonary asthma , sleep apnea , former smoker   Pulmonary exam normal        Cardiovascular hypertension, Pt. 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  Reproductive/Obstetrics negative OB ROS                              Anesthesia Physical Anesthesia Plan  ASA: 2  Anesthesia Plan: General   Post-op Pain Management: Tylenol PO (pre-op)*, Ketamine IV* and Sufentanil infusion   Induction: Intravenous  PONV Risk Score and Plan: 4 or greater and Propofol infusion, Treatment may vary due to age or medical condition, Midazolam, Dexamethasone and Scopolamine patch - Pre-op  Airway Management Planned: Oral ETT  Additional Equipment: Arterial line  Intra-op Plan:   Post-operative Plan: Extubation in OR  Informed Consent: I have reviewed the patients History and Physical, chart, labs and discussed the procedure including the risks, benefits and alternatives for the proposed anesthesia with the patient or  authorized representative who has indicated his/her understanding and acceptance.     Dental advisory given  Plan Discussed with: CRNA  Anesthesia Plan Comments: (Charted "lidocaine allergy" discussed with patient. She is unclear of the circumstances that led to this being added to her chart. She has itching with multiple medications. She tells me that she believes she may have had itching with lidocaine but denies any sx of anaphylaxis. In chart review, it appears she was given lidocaine with bupivacaine during her spinal cord stimualtor explant in 2018. She denies any problems during or after this procedure. We agreed that we should remove lidocaine from her allergy list. She has itching with all opioids and continues to take oxycodone but occasionally requires benadryl for itching. We discussed that opioids were unavoidable for this type of surgery but we would treat any itching should it occur. Patient is in agreement with plan. Stephannie Peters, MD  )        Anesthesia Quick Evaluation

## 2022-08-31 ENCOUNTER — Encounter (HOSPITAL_COMMUNITY): Payer: Self-pay | Admitting: Orthopedic Surgery

## 2022-08-31 ENCOUNTER — Inpatient Hospital Stay (HOSPITAL_COMMUNITY)
Admission: RE | Disposition: A | Discharge status: Home / Self Care | Payer: Self-pay | Source: Home / Self Care | Attending: Orthopedic Surgery

## 2022-08-31 ENCOUNTER — Ambulatory Visit (HOSPITAL_BASED_OUTPATIENT_CLINIC_OR_DEPARTMENT_OTHER): Payer: Medicare (Managed Care) | Admitting: Vascular Surgery

## 2022-08-31 ENCOUNTER — Ambulatory Visit (HOSPITAL_COMMUNITY): Payer: Medicare (Managed Care) | Admitting: Vascular Surgery

## 2022-08-31 ENCOUNTER — Inpatient Hospital Stay (HOSPITAL_COMMUNITY)
Admission: RE | Admit: 2022-08-31 | Discharge: 2022-09-03 | DRG: 460 | Disposition: A | Discharge status: Home / Self Care | Payer: Medicare (Managed Care) | Attending: Orthopedic Surgery | Admitting: Orthopedic Surgery

## 2022-08-31 ENCOUNTER — Ambulatory Visit (HOSPITAL_COMMUNITY): Payer: Medicare (Managed Care)

## 2022-08-31 ENCOUNTER — Other Ambulatory Visit: Payer: Self-pay

## 2022-08-31 DIAGNOSIS — Z8673 Personal history of transient ischemic attack (TIA), and cerebral infarction without residual deficits: Secondary | ICD-10-CM

## 2022-08-31 DIAGNOSIS — D62 Acute posthemorrhagic anemia: Secondary | ICD-10-CM | POA: Diagnosis not present

## 2022-08-31 DIAGNOSIS — M4316 Spondylolisthesis, lumbar region: Secondary | ICD-10-CM | POA: Diagnosis not present

## 2022-08-31 DIAGNOSIS — J449 Chronic obstructive pulmonary disease, unspecified: Secondary | ICD-10-CM | POA: Diagnosis present

## 2022-08-31 DIAGNOSIS — G4730 Sleep apnea, unspecified: Secondary | ICD-10-CM | POA: Diagnosis present

## 2022-08-31 DIAGNOSIS — I10 Essential (primary) hypertension: Secondary | ICD-10-CM | POA: Diagnosis present

## 2022-08-31 DIAGNOSIS — Z853 Personal history of malignant neoplasm of breast: Secondary | ICD-10-CM

## 2022-08-31 DIAGNOSIS — I2510 Atherosclerotic heart disease of native coronary artery without angina pectoris: Secondary | ICD-10-CM | POA: Diagnosis present

## 2022-08-31 DIAGNOSIS — Z79899 Other long term (current) drug therapy: Secondary | ICD-10-CM

## 2022-08-31 DIAGNOSIS — G8929 Other chronic pain: Secondary | ICD-10-CM | POA: Diagnosis present

## 2022-08-31 DIAGNOSIS — Z87442 Personal history of urinary calculi: Secondary | ICD-10-CM

## 2022-08-31 DIAGNOSIS — Z87891 Personal history of nicotine dependence: Secondary | ICD-10-CM

## 2022-08-31 DIAGNOSIS — M5416 Radiculopathy, lumbar region: Secondary | ICD-10-CM | POA: Diagnosis not present

## 2022-08-31 DIAGNOSIS — D571 Sickle-cell disease without crisis: Secondary | ICD-10-CM | POA: Diagnosis present

## 2022-08-31 DIAGNOSIS — M48061 Spinal stenosis, lumbar region without neurogenic claudication: Secondary | ICD-10-CM | POA: Diagnosis present

## 2022-08-31 DIAGNOSIS — K219 Gastro-esophageal reflux disease without esophagitis: Secondary | ICD-10-CM | POA: Diagnosis present

## 2022-08-31 DIAGNOSIS — F419 Anxiety disorder, unspecified: Secondary | ICD-10-CM | POA: Diagnosis present

## 2022-08-31 LAB — TYPE AND SCREEN
ABO/RH(D): B POS
Antibody Screen: NEGATIVE

## 2022-08-31 SURGERY — POSTERIOR LUMBAR FUSION 1 LEVEL
Anesthesia: General

## 2022-08-31 MED ORDER — ACETAMINOPHEN 500 MG PO TABS
1000.0000 mg | ORAL_TABLET | Freq: Once | ORAL | Status: DC
Start: 2022-08-31 — End: 2022-08-31

## 2022-08-31 MED ORDER — SUGAMMADEX SODIUM 200 MG/2ML IV SOLN
INTRAVENOUS | Status: DC | PRN
  Administered 2022-08-31: 200 mg via INTRAVENOUS

## 2022-08-31 MED ORDER — ROCURONIUM BROMIDE 100 MG/10ML IV SOLN
INTRAVENOUS | Status: DC | PRN
  Administered 2022-08-31: 40 mg via INTRAVENOUS
  Administered 2022-08-31: 20 mg via INTRAVENOUS

## 2022-08-31 MED ORDER — QUETIAPINE FUMARATE 100 MG PO TABS
100.0000 mg | ORAL_TABLET | Freq: Every day | ORAL | Status: DC
  Administered 2022-08-31 – 2022-09-02 (×3): 100 mg via ORAL
  Filled 2022-08-31 (×3): qty 1

## 2022-08-31 MED ORDER — LIDOCAINE 2% (20 MG/ML) 5 ML SYRINGE
INTRAMUSCULAR | Status: DC | PRN
  Administered 2022-08-31: 100 mg via INTRAVENOUS

## 2022-08-31 MED ORDER — DEXAMETHASONE SODIUM PHOSPHATE 10 MG/ML IJ SOLN
INTRAMUSCULAR | Status: AC
  Filled 2022-08-31: qty 1

## 2022-08-31 MED ORDER — METHOCARBAMOL 500 MG PO TABS
500.0000 mg | ORAL_TABLET | Freq: Four times a day (QID) | ORAL | Status: DC
  Administered 2022-08-31 – 2022-09-02 (×9): 500 mg via ORAL
  Filled 2022-08-31 (×11): qty 1

## 2022-08-31 MED ORDER — SCOPOLAMINE 1 MG/3DAYS TD PT72
1.0000 | MEDICATED_PATCH | Freq: Once | TRANSDERMAL | Status: DC
  Administered 2022-08-31: 1.5 mg via TRANSDERMAL
  Filled 2022-08-31: qty 1

## 2022-08-31 MED ORDER — EPHEDRINE 5 MG/ML INJ
INTRAVENOUS | Status: AC
  Filled 2022-08-31: qty 5

## 2022-08-31 MED ORDER — DEXAMETHASONE SODIUM PHOSPHATE 10 MG/ML IJ SOLN
10.0000 mg | Freq: Once | INTRAMUSCULAR | Status: AC
  Administered 2022-08-31: 10 mg via INTRAVENOUS
  Filled 2022-08-31: qty 1

## 2022-08-31 MED ORDER — PROPOFOL 500 MG/50ML IV EMUL
INTRAVENOUS | Status: DC | PRN
  Administered 2022-08-31: 25 ug/kg/min via INTRAVENOUS
  Administered 2022-08-31: 80 ug/kg/min via INTRAVENOUS

## 2022-08-31 MED ORDER — 0.9 % SODIUM CHLORIDE (POUR BTL) OPTIME
TOPICAL | Status: DC | PRN
  Administered 2022-08-31: 1000 mL

## 2022-08-31 MED ORDER — ALBUTEROL SULFATE (2.5 MG/3ML) 0.083% IN NEBU: 2.5000 mg | INHALATION_SOLUTION | RESPIRATORY_TRACT | Status: DC | PRN

## 2022-08-31 MED ORDER — MIDAZOLAM HCL 2 MG/2ML IJ SOLN
INTRAMUSCULAR | Status: DC | PRN
  Administered 2022-08-31: 2 mg via INTRAVENOUS

## 2022-08-31 MED ORDER — PHENYLEPHRINE HCL-NACL 20-0.9 MG/250ML-% IV SOLN
INTRAVENOUS | Status: DC | PRN
  Administered 2022-08-31: 25 ug/min via INTRAVENOUS

## 2022-08-31 MED ORDER — AMITRIPTYLINE HCL 50 MG PO TABS
50.0000 mg | ORAL_TABLET | Freq: Every day | ORAL | Status: DC
  Administered 2022-08-31 – 2022-09-02 (×3): 50 mg via ORAL
  Filled 2022-08-31 (×3): qty 1

## 2022-08-31 MED ORDER — FENTANYL CITRATE (PF) 250 MCG/5ML IJ SOLN
INTRAMUSCULAR | Status: AC
  Filled 2022-08-31: qty 5

## 2022-08-31 MED ORDER — FENTANYL CITRATE (PF) 250 MCG/5ML IJ SOLN
INTRAMUSCULAR | Status: DC | PRN
  Administered 2022-08-31 (×2): 100 ug via INTRAVENOUS
  Administered 2022-08-31: 50 ug via INTRAVENOUS

## 2022-08-31 MED ORDER — ACETAMINOPHEN 10 MG/ML IV SOLN: 1000.0000 mg | Freq: Once | INTRAVENOUS | Status: DC | PRN

## 2022-08-31 MED ORDER — THROMBIN 20000 UNITS EX SOLR
CUTANEOUS | Status: DC | PRN
  Administered 2022-08-31: 20 mL via TOPICAL

## 2022-08-31 MED ORDER — VANCOMYCIN HCL 1000 MG IV SOLR
INTRAVENOUS | Status: DC | PRN
  Administered 2022-08-31: 1000 mg via TOPICAL

## 2022-08-31 MED ORDER — HYDROMORPHONE HCL 1 MG/ML IJ SOLN
1.0000 mg | INTRAMUSCULAR | Status: DC | PRN
  Filled 2022-08-31: qty 1

## 2022-08-31 MED ORDER — ACETAMINOPHEN 500 MG PO TABS
1000.0000 mg | ORAL_TABLET | Freq: Once | ORAL | Status: AC
  Administered 2022-08-31: 1000 mg via ORAL
  Filled 2022-08-31: qty 2

## 2022-08-31 MED ORDER — SUFENTANIL CITRATE 50 MCG/ML IV SOLN
0.5000 ug/kg/h | INTRAVENOUS | Status: AC
  Administered 2022-08-31 (×2): .25 ug/kg/h via INTRAVENOUS
  Filled 2022-08-31: qty 1

## 2022-08-31 MED ORDER — NITROGLYCERIN 0.4 MG SL SUBL: 0.4000 mg | SUBLINGUAL_TABLET | SUBLINGUAL | Status: DC | PRN

## 2022-08-31 MED ORDER — DEXMEDETOMIDINE HCL IN NACL 80 MCG/20ML IV SOLN
INTRAVENOUS | Status: DC | PRN
  Administered 2022-08-31 (×2): 8 ug via INTRAVENOUS
  Administered 2022-08-31: 4 ug via INTRAVENOUS

## 2022-08-31 MED ORDER — DROPERIDOL 2.5 MG/ML IJ SOLN: 0.6250 mg | Freq: Once | INTRAMUSCULAR | Status: DC | PRN

## 2022-08-31 MED ORDER — OXYCODONE HCL 5 MG PO TABS
20.0000 mg | ORAL_TABLET | ORAL | Status: DC | PRN
  Administered 2022-08-31 – 2022-09-02 (×10): 25 mg via ORAL
  Filled 2022-08-31 (×10): qty 5

## 2022-08-31 MED ORDER — ROCURONIUM BROMIDE 10 MG/ML (PF) SYRINGE
PREFILLED_SYRINGE | INTRAVENOUS | Status: AC
  Filled 2022-08-31: qty 10

## 2022-08-31 MED ORDER — SUCCINYLCHOLINE CHLORIDE 200 MG/10ML IV SOSY
PREFILLED_SYRINGE | INTRAVENOUS | Status: DC | PRN
  Administered 2022-08-31: 120 mg via INTRAVENOUS

## 2022-08-31 MED ORDER — VANCOMYCIN HCL 1000 MG IV SOLR
INTRAVENOUS | Status: AC
  Filled 2022-08-31: qty 20

## 2022-08-31 MED ORDER — DOCUSATE SODIUM 100 MG PO CAPS
100.0000 mg | ORAL_CAPSULE | Freq: Two times a day (BID) | ORAL | Status: DC
  Administered 2022-09-01 – 2022-09-03 (×5): 100 mg via ORAL
  Filled 2022-08-31 (×6): qty 1

## 2022-08-31 MED ORDER — TRANEXAMIC ACID-NACL 1000-0.7 MG/100ML-% IV SOLN
1000.0000 mg | Freq: Once | INTRAVENOUS | Status: AC
  Administered 2022-08-31: 1000 mg via INTRAVENOUS
  Filled 2022-08-31: qty 100

## 2022-08-31 MED ORDER — ORAL CARE MOUTH RINSE: 15.0000 mL | Freq: Once | OROMUCOSAL | Status: AC

## 2022-08-31 MED ORDER — LACTATED RINGERS IV SOLN: INTRAVENOUS | Status: DC

## 2022-08-31 MED ORDER — HYDROMORPHONE HCL 1 MG/ML IJ SOLN
INTRAMUSCULAR | Status: AC
  Filled 2022-08-31: qty 0.5

## 2022-08-31 MED ORDER — PROPOFOL 10 MG/ML IV BOLUS
INTRAVENOUS | Status: AC
  Filled 2022-08-31: qty 20

## 2022-08-31 MED ORDER — PHENYLEPHRINE 80 MCG/ML (10ML) SYRINGE FOR IV PUSH (FOR BLOOD PRESSURE SUPPORT)
PREFILLED_SYRINGE | INTRAVENOUS | Status: AC
  Filled 2022-08-31: qty 10

## 2022-08-31 MED ORDER — POVIDONE-IODINE 10 % EX SWAB: 2.0000 | Freq: Once | CUTANEOUS | Status: DC

## 2022-08-31 MED ORDER — LACTATED RINGERS IV SOLN: INTRAVENOUS | Status: DC | PRN

## 2022-08-31 MED ORDER — KETAMINE HCL 50 MG/5ML IJ SOSY
PREFILLED_SYRINGE | INTRAMUSCULAR | Status: AC
  Filled 2022-08-31: qty 5

## 2022-08-31 MED ORDER — ONDANSETRON HCL 4 MG PO TABS: 4.0000 mg | ORAL_TABLET | Freq: Four times a day (QID) | ORAL | Status: DC | PRN

## 2022-08-31 MED ORDER — HYDROMORPHONE HCL 1 MG/ML IJ SOLN
INTRAMUSCULAR | Status: DC | PRN
  Administered 2022-08-31 (×2): .25 mg via INTRAVENOUS

## 2022-08-31 MED ORDER — THROMBIN 20000 UNITS EX KIT
PACK | CUTANEOUS | Status: AC
  Filled 2022-08-31: qty 1

## 2022-08-31 MED ORDER — CHLORHEXIDINE GLUCONATE 0.12 % MT SOLN
15.0000 mL | Freq: Once | OROMUCOSAL | Status: AC
  Administered 2022-08-31: 15 mL via OROMUCOSAL
  Filled 2022-08-31: qty 15

## 2022-08-31 MED ORDER — BUPIVACAINE-EPINEPHRINE (PF) 0.25% -1:200000 IJ SOLN
INTRAMUSCULAR | Status: AC
  Filled 2022-08-31: qty 30

## 2022-08-31 MED ORDER — ARIPIPRAZOLE 15 MG PO TBDP: 15.0000 mg | ORAL_TABLET | Freq: Every day | ORAL | Status: DC

## 2022-08-31 MED ORDER — ARIPIPRAZOLE 5 MG PO TABS
15.0000 mg | ORAL_TABLET | Freq: Every day | ORAL | Status: DC
  Administered 2022-09-01 – 2022-09-02 (×2): 15 mg via ORAL
  Filled 2022-08-31 (×4): qty 1

## 2022-08-31 MED ORDER — PROPOFOL 1000 MG/100ML IV EMUL
INTRAVENOUS | Status: AC
  Filled 2022-08-31: qty 100

## 2022-08-31 MED ORDER — SUFENTANIL CITRATE 50 MCG/ML IV SOLN
0.5000 ug/kg/h | Freq: Once | INTRAVENOUS | Status: DC
  Filled 2022-08-31: qty 1

## 2022-08-31 MED ORDER — LIDOCAINE 2% (20 MG/ML) 5 ML SYRINGE
INTRAMUSCULAR | Status: AC
  Filled 2022-08-31: qty 5

## 2022-08-31 MED ORDER — BUSPIRONE HCL 10 MG PO TABS
10.0000 mg | ORAL_TABLET | Freq: Three times a day (TID) | ORAL | Status: DC
  Administered 2022-08-31 – 2022-09-03 (×8): 10 mg via ORAL
  Filled 2022-08-31 (×8): qty 1

## 2022-08-31 MED ORDER — ONDANSETRON HCL 4 MG/2ML IJ SOLN: 4.0000 mg | Freq: Four times a day (QID) | INTRAMUSCULAR | Status: DC | PRN

## 2022-08-31 MED ORDER — HYDROMORPHONE HCL 1 MG/ML IJ SOLN
0.2500 mg | INTRAMUSCULAR | Status: DC | PRN
  Administered 2022-08-31: 0.5 mg via INTRAVENOUS

## 2022-08-31 MED ORDER — GABAPENTIN 300 MG PO CAPS
600.0000 mg | ORAL_CAPSULE | Freq: Three times a day (TID) | ORAL | Status: DC
  Administered 2022-08-31 – 2022-09-03 (×8): 600 mg via ORAL
  Filled 2022-08-31 (×8): qty 2

## 2022-08-31 MED ORDER — HYDROMORPHONE HCL 1 MG/ML IJ SOLN
INTRAMUSCULAR | Status: AC
  Filled 2022-08-31: qty 1

## 2022-08-31 MED ORDER — CLINDAMYCIN PHOSPHATE 600 MG/50ML IV SOLN
600.0000 mg | Freq: Four times a day (QID) | INTRAVENOUS | Status: AC
  Administered 2022-08-31 – 2022-09-01 (×3): 600 mg via INTRAVENOUS
  Filled 2022-08-31 (×3): qty 50

## 2022-08-31 MED ORDER — PROMETHAZINE HCL 25 MG PO TABS
25.0000 mg | ORAL_TABLET | Freq: Three times a day (TID) | ORAL | Status: DC | PRN
  Administered 2022-08-31 – 2022-09-03 (×4): 25 mg via ORAL
  Filled 2022-08-31 (×5): qty 1

## 2022-08-31 MED ORDER — ACETAMINOPHEN 500 MG PO TABS
1000.0000 mg | ORAL_TABLET | Freq: Three times a day (TID) | ORAL | Status: DC
  Administered 2022-08-31 – 2022-09-03 (×8): 1000 mg via ORAL
  Filled 2022-08-31 (×8): qty 2

## 2022-08-31 MED ORDER — TRANEXAMIC ACID-NACL 1000-0.7 MG/100ML-% IV SOLN
1000.0000 mg | INTRAVENOUS | Status: AC
  Administered 2022-08-31: 1000 mg via INTRAVENOUS
  Filled 2022-08-31: qty 100

## 2022-08-31 MED ORDER — PHENYLEPHRINE 80 MCG/ML (10ML) SYRINGE FOR IV PUSH (FOR BLOOD PRESSURE SUPPORT)
PREFILLED_SYRINGE | INTRAVENOUS | Status: DC | PRN
  Administered 2022-08-31: 160 ug via INTRAVENOUS
  Administered 2022-08-31 (×4): 80 ug via INTRAVENOUS
  Administered 2022-08-31: 160 ug via INTRAVENOUS
  Administered 2022-08-31: 80 ug via INTRAVENOUS

## 2022-08-31 MED ORDER — PROPOFOL 10 MG/ML IV BOLUS
INTRAVENOUS | Status: DC | PRN
  Administered 2022-08-31: 200 mg via INTRAVENOUS

## 2022-08-31 MED ORDER — ALBUMIN HUMAN 5 % IV SOLN: INTRAVENOUS | Status: DC | PRN

## 2022-08-31 MED ORDER — FAMOTIDINE 20 MG PO TABS
20.0000 mg | ORAL_TABLET | Freq: Every day | ORAL | Status: DC
  Administered 2022-08-31 – 2022-09-03 (×4): 20 mg via ORAL
  Filled 2022-08-31 (×4): qty 1

## 2022-08-31 MED ORDER — ONDANSETRON HCL 4 MG/2ML IJ SOLN
INTRAMUSCULAR | Status: AC
  Filled 2022-08-31: qty 2

## 2022-08-31 MED ORDER — MIDAZOLAM HCL 2 MG/2ML IJ SOLN
INTRAMUSCULAR | Status: AC
  Filled 2022-08-31: qty 2

## 2022-08-31 MED ORDER — DIPHENHYDRAMINE HCL 25 MG PO CAPS
25.0000 mg | ORAL_CAPSULE | Freq: Four times a day (QID) | ORAL | Status: DC | PRN
  Administered 2022-08-31 – 2022-09-02 (×4): 25 mg via ORAL
  Filled 2022-08-31 (×5): qty 1

## 2022-08-31 MED ORDER — POLYETHYLENE GLYCOL 3350 17 G PO PACK
17.0000 g | PACK | Freq: Two times a day (BID) | ORAL | Status: DC
  Administered 2022-09-02: 17 g via ORAL
  Filled 2022-08-31 (×6): qty 1

## 2022-08-31 MED ORDER — KETAMINE HCL 10 MG/ML IJ SOLN
INTRAMUSCULAR | Status: DC | PRN
  Administered 2022-08-31 (×2): 10 mg via INTRAVENOUS
  Administered 2022-08-31: 20 mg via INTRAVENOUS

## 2022-08-31 MED ORDER — CLINDAMYCIN PHOSPHATE 900 MG/50ML IV SOLN
900.0000 mg | INTRAVENOUS | Status: AC
  Administered 2022-08-31: 900 mg via INTRAVENOUS
  Filled 2022-08-31: qty 50

## 2022-08-31 SURGICAL SUPPLY — 67 items
APL SKNCLS STERI-STRIP NONHPOA (GAUZE/BANDAGES/DRESSINGS) ×1
BENZOIN TINCTURE PRP APPL 2/3 (GAUZE/BANDAGES/DRESSINGS) ×1 IMPLANT
BONE FIBERS PLIAFX 5ML (Bone Implant) ×1 IMPLANT
BUR NEURO DRILL SOFT 3.0X3.8M (BURR) ×1 IMPLANT
CANISTER SUCT 3000ML PPV (MISCELLANEOUS) ×1 IMPLANT
CATH FOLEY LATEX FREE 16FR (CATHETERS) ×1
CATH FOLEY LF 16FR (CATHETERS) IMPLANT
CLSR STERI-STRIP ANTIMIC 1/2X4 (GAUZE/BANDAGES/DRESSINGS) ×1 IMPLANT
CORD BIPOLAR FORCEPS 12FT (ELECTRODE) IMPLANT
COVER SURGICAL LIGHT HANDLE (MISCELLANEOUS) ×1 IMPLANT
DRAIN HEMOVAC 7FR (DRAIN) ×2 IMPLANT
DRAPE C-ARM 42X72 X-RAY (DRAPES) ×1 IMPLANT
DRAPE C-ARMOR (DRAPES) IMPLANT
DRAPE UTILITY XL STRL (DRAPES) ×2 IMPLANT
DRESSING MEPILEX FLEX 4X4 (GAUZE/BANDAGES/DRESSINGS) ×1 IMPLANT
DRSG MEPILEX FLEX 4X4 (GAUZE/BANDAGES/DRESSINGS) ×1
DRSG MEPILEX POST OP 4X8 (GAUZE/BANDAGES/DRESSINGS) IMPLANT
DRSG TEGADERM 4X10 (GAUZE/BANDAGES/DRESSINGS) ×1 IMPLANT
DRSG TEGADERM 4X4.75 (GAUZE/BANDAGES/DRESSINGS) IMPLANT
DURAPREP 26ML APPLICATOR (WOUND CARE) ×1 IMPLANT
ELECT BLADE INSULATED 4IN (ELECTROSURGICAL) ×1
ELECT COATED BLADE 2.86 ST (ELECTRODE) ×1 IMPLANT
ELECT PENCIL ROCKER SW 15FT (MISCELLANEOUS) ×1 IMPLANT
ELECT REM PT RETURN 9FT ADLT (ELECTROSURGICAL) ×1
ELECTRODE BLADE INSULATED 4IN (ELECTROSURGICAL) ×1 IMPLANT
ELECTRODE REM PT RTRN 9FT ADLT (ELECTROSURGICAL) ×1 IMPLANT
EVACUATOR 1/8 PVC DRAIN (DRAIN) IMPLANT
GAUZE SPONGE 4X4 12PLY STRL (GAUZE/BANDAGES/DRESSINGS) ×1 IMPLANT
GLOVE BIO SURGEON STRL SZ7.5 (GLOVE) ×1 IMPLANT
GLOVE BIOGEL PI IND STRL 7.5 (GLOVE) ×1 IMPLANT
GLOVE INDICATOR 8.0 STRL GRN (GLOVE) ×1 IMPLANT
GOWN STRL REUS W/ TWL XL LVL3 (GOWN DISPOSABLE) ×1 IMPLANT
GOWN STRL REUS W/TWL XL LVL3 (GOWN DISPOSABLE) ×1
GRAFT BNE FBR PLIAFX PRIME 5 (Bone Implant) IMPLANT
KIT BASIN OR (CUSTOM PROCEDURE TRAY) ×1 IMPLANT
KIT POSITION SURG JACKSON T1 (MISCELLANEOUS) ×1 IMPLANT
KIT TURNOVER KIT B (KITS) ×1 IMPLANT
MODULE EMG NDL SSEP NVM5 (NEEDLE) IMPLANT
MODULE EMG NEEDLE SSEP NVM5 (NEEDLE) ×1 IMPLANT
MODULE NVM5 NEXT GEN EMG (NEEDLE) IMPLANT
NDL 22X1.5 STRL (OR ONLY) (MISCELLANEOUS) ×1 IMPLANT
NEEDLE 22X1.5 STRL (OR ONLY) (MISCELLANEOUS) ×1 IMPLANT
NS IRRIG 1000ML POUR BTL (IV SOLUTION) ×1 IMPLANT
PACK LAMINECTOMY ORTHO (CUSTOM PROCEDURE TRAY) ×1 IMPLANT
PATTIES SURGICAL .5 X.5 (GAUZE/BANDAGES/DRESSINGS) IMPLANT
PROBE BALL TIP NVM5 SNG USE (BALLOONS) IMPLANT
ROD RELIN-O LORD 5.5X65MM (Rod) IMPLANT
SCREW LOCK RELINE 5.5 TULIP (Screw) IMPLANT
SCREW PA RELINE-O 7.5X30 (Screw) IMPLANT
SCREW RELINE-O POLY 7.5X50 (Screw) ×4 IMPLANT
SCREW RLINE PLY 2S 50X7.5XPA (Screw) IMPLANT
SPONGE SURGIFOAM ABS GEL 100 (HEMOSTASIS) ×1 IMPLANT
SPONGE T-LAP 4X18 ~~LOC~~+RFID (SPONGE) ×1 IMPLANT
SUCTION FRAZIER HANDLE 10FR (MISCELLANEOUS) ×1
SUCTION TUBE FRAZIER 10FR DISP (MISCELLANEOUS) ×1 IMPLANT
SUT BONE WAX W31G (SUTURE) ×1 IMPLANT
SUT ETHILON 3 0 PS 1 (SUTURE) IMPLANT
SUT VIC AB 0 CT1 18XCR BRD8 (SUTURE) ×1 IMPLANT
SUT VIC AB 0 CT1 8-18 (SUTURE) ×3
SUT VIC AB 2-0 CT1 18 (SUTURE) ×1 IMPLANT
SUT VIC AB 3-0 PS2 18 (SUTURE) ×1 IMPLANT
SYR BULB IRRIG 60ML STRL (SYRINGE) ×1 IMPLANT
SYR CONTROL 10ML LL (SYRINGE) ×1 IMPLANT
TOWEL GREEN STERILE (TOWEL DISPOSABLE) ×1 IMPLANT
TOWEL GREEN STERILE FF (TOWEL DISPOSABLE) ×1 IMPLANT
TUBING FEATHERFLOW (TUBING) ×1 IMPLANT
WATER STERILE IRR 1000ML POUR (IV SOLUTION) ×1 IMPLANT

## 2022-08-31 NOTE — Transfer of Care (Signed)
Immediate Anesthesia Transfer of Care Note  Patient: [name]  Procedure(s) Performed: L4-5 POSTERIOR SPINAL FUSION, L4-5 LAMINECTOMY AND RIGHT SIDED FORAMINOTOMY/ L4-S1 POSTERIOR INSTRUMENTATION  Patient Location: PACU  Anesthesia Type:General  Level of Consciousness: awake and alert   Airway & Oxygen Therapy: Patient Spontanous Breathing  Post-op Assessment: Report given to RN and Post -op Vital signs reviewed and stable  Post vital signs: Reviewed and stable  Last Vitals:  Vitals Value Taken Time  BP 141/97 08/31/22 1858  Temp 36.7 C 08/31/22 1853  Pulse 101 08/31/22 1859  Resp 16 08/31/22 1859  SpO2 100 % 08/31/22 1858  Vitals shown include unvalidated device data.  Last Pain:  Vitals:   08/31/22 1110  PainSc: 8          Complications: No notable events documented.

## 2022-08-31 NOTE — Progress Notes (Signed)
Nurse on floor called on- call service.  Patient concerned about~20 minutes of RLE numbness.   Pain controlled with oral medication, no change in pain level. Some twitching in her foot but she has some DF, PF weakness by exam by RN. Discussed with Dr Christell Constant who will evaluate in the early morning
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
## 2022-08-31 NOTE — Anesthesia Procedure Notes (Signed)
Procedure Name: Intubation Date/Time: 08/31/2022 1:09 PM  Performed by: Shary Decamp, CRNAPre-anesthesia Checklist: Patient identified, Patient being monitored, Timeout performed, Emergency Drugs available and Suction available Patient Re-evaluated:Patient Re-evaluated prior to induction Oxygen Delivery Method: Circle System Utilized Preoxygenation: Pre-oxygenation with 100% oxygen Induction Type: IV induction Ventilation: Mask ventilation without difficulty Laryngoscope Size: Miller and 2 Grade View: Grade I Tube type: Oral Tube size: 7.0 mm Number of attempts: 1 Airway Equipment and Method: Stylet Placement Confirmation: ETT inserted through vocal cords under direct vision, positive ETCO2 and breath sounds checked- equal and bilateral Secured at: 21 cm Tube secured with: Tape Dental Injury: Teeth and Oropharynx as per pre-operative assessment

## 2022-08-31 NOTE — H&P (Addendum)
Orthopedic Spine Surgery H&P Note  Assessment: Patient is a 57 y.o. female with unstable spondylolisthesis at L4/5 with foraminal stenosis causing lumbar radiculopathy   Plan: -Out of bed as tolerated, activity as tolerated, no brace -Covered the risks of surgery one more time with the patient and patient elected to proceed with planned surgery -Written consent verified -Hold anticoagulation in anticipation of surgery -Clindamycin and TXA on call to OR -NPO for procedure -Site marked -To OR when ready  The patient has symptoms consistent with lumbar radiculopathy. The patient's symptoms were not getting improvement with conservative treatment so operative management was discussed in the form of L4/5 laminectomy, foraminotomy, and posterior instrumented spinal fusion. The risks including but not limited to dural tear, nerve root injury, paralysis, persistent pain, pseudarthrosis, infection, bleeding, hardware failure/malposition, adjacent segment disease, heart attack, death, stroke, fracture, and need for additional procedures were discussed with the patient. Explained that dural tear and infection risks are higher in her due to the fact that this is a revision surgery. The benefit of the surgery would be improvement in her right-sided leg pain. I explained that back pain is not reliably relieved with this surgery but due to the unstable nature of her spondylolisthesis, I think she can expect some improvement in her back pain. The alternatives to surgical management were covered with the patient and included continued monitoring, physical therapy, over-the-counter pain medications, ambulatory aids, repeat injections, and activity modification. All the patient's questions were answered to her satisfaction. After this discussion, the patient expressed understanding and elected to proceed with surgical intervention.    ___________________________________________________________________________  Chief  Complaint: low back pain that radiates into her right lower extremity  History: Patient is 57 y.o. female who has been previously seen in the office for low back pain that radiates into her right lower extremity. Work up showed an unstable spondylolisthesis with foraminal stenosis at L4/5 above her old fusion. Her symptoms were consistent with lumbar radiculopathy. These symptoms failed to improve with conservative treatment so operative management was discussed at the last office visit. The patient presents today with no changes in her symptoms since the last office visit. See previous office note for further details.    Of note, patient has been nicotine free for over 8 weeks now  Review of systems: General: denies fevers and chills, myalgias Neurologic: denies recent changes in vision, slurred speech Abdomen: denies nausea, vomiting, hematemesis Respiratory: denies cough, shortness of breath  Past medical history: GERD Breast cancer Bipolar Anxiety CAD Stroke HTN Kidney stones Sickle cell anemia Pancreatitis Sleep apnea   Allergies (12): latex, lidocaine, metoclopramide, penicllins, phenothiazine, fentanyl, hydromorphone, iodine, iohexol, ketorolac, ondansetron, prochlorperazine   Past surgical history:  Breast lumpectomy Cholecystectomy Cesarean section L5/S1 PSIF and interbody fusion Mastectomy Tubal ligation  Social history:  Quit use of nicotine-containing products (cigarettes, vaping, smokeless, etc.). Has been nicotine free for over 8 weeks now Alcohol use: denies Denies recreational drug use  Family history: -reviewed and not pertinent to unstable spondylolisthesis and lumbar radiculopathy   Physical Exam:  General: no acute distress, appears stated age Neurologic: alert, answering questions appropriately, following commands Cardiovascular: regular rate, no cyanosis Respiratory: unlabored breathing on room air, symmetric chest rise Psychiatric: appropriate  affect, normal cadence to speech   MSK (spine):  -Strength exam      Left  Right  EHL    4/5  4/5 TA    5/5  5/5 GSC    5/5  5/5 Knee extension  5/5  5/5 Knee flexion   5/5  5/5 Hip flexion   5/5  5/5  -Sensory exam    Sensation intact to light touch in L3-S1 nerve distributions of bilateral lower extremities   Patient name: [name] Patient MRN: [id number] Date: 08/31/22

## 2022-08-31 NOTE — Op Note (Addendum)
Orthopedic Spine Surgery Operative Report  Procedure: L4 and L5 segment lumbar laminectomies L4/5 posterior lumbar spinal arthrodesis L4, L5, S1 pedicle screw instrumentation with rods Application of locally harvested autograft (spinous processes, removed lamina) Application of demineralized bone matrix  Modifier: none  Date of procedure: 08/31/2022  Patient name: [name] MRN: [id number] DOB: [birth year]  Surgeon: Willia Craze, MD Assistant: None Pre-operative diagnosis: lumbar radiculopathy, unstable L4/5 spondylolisthesis Post-operative diagnosis: same as above Findings: L4/5 spondylolisthesis, successful fusion at L5/S1  Specimens: none Anesthesia: general EBL: 250cc Complications: none Pre-incision antibiotic: clindamycin TXA was given prior to incision as well  Implants:   Implant Name Type Inv. Item Serial No. Manufacturer Lot No. LRB No. Used Action  BONE FIBERS PLIAFX - [id number] Bone Implant B[employer]IB[employer]IAFX [id number] [employer]  N/A 1 Implanted  SCREW RELINE-O POLY 7.5X50 - [id number] Screw SCREW RELINE-O POLY 7.5X50  [employer]  N/A 4 Implanted  SCREW LOCK RELINE 5.5 [place] - [id number] Screw SCREW LOCK RELINE 5.5 [place]  [employer]  N/A 6 Implanted  ROD RELIN-O LORD 5.5X65MM - [id number] Rod ROD RELIN-O LORD 5.5X65MM  [employer]  N/A 2 Implanted  0 screw 7.5x30 Reline    [employer]  N/A 2 Implanted      Indication for procedure: Patient is a 57 y.o. female who presented to the office with symptoms consistent with lumbar radiculopathy on the right side. The patient had tried conservative treatments that did not provide any lasting relief. As result, operative management was discussed. Her imaging showed an unstable spondylolisthesis and her MRI showed foraminal stenosis at L4/5 so a L4 and L5 laminectomy with right-sided foraminotomy and arthrodesis was presented as a treatment option. The risks including but not limited to dural  tear, nerve root injury, paralysis, persistent pain, pseudarthrosis, infection, bleeding, hardware failure, adjacent segment disease, heart attack, death, stroke, fracture, and need for additional procedures were discussed with the patient. The benefit of the surgery would be relief of the patient's right leg pain. I explained that back pain is not reliably relieved with this surgery but due to the unstable nature of her spondylolisthesis, I think she can expect some improvement in her back pain. The alternatives to surgical management were covered with the patient and included continued monitoring, physical therapy, over-the-counter pain medications, ambulatory aids, repeat injections, and activity modification. All the patient's questions were answered to her satisfaction. After this discussion, the patient expressed understanding and elected to proceed with surgical intervention.    Procedure Description: The patient was met in the pre-operative holding area. The patient's identity and consent were verified. The operative site was marked. The patient's remaining questions about the surgery were answered. The patient was brought back to the operating room. General anesthesia was induced and an endotracheal tube was placed by the anesthesia staff. The neuromonitoring technologist applied leads to her lower extremities. The patient was transferred to the prone Artois table in the prone position. All bony prominences were well padded. The head of the bed was slightly elevated and the eyes were free from compression by the face pillow. Baseline neuromonitoring signals were obtained. The surgical area was cleansed with alcohol. Clindamycin and TXA were given. Fluoroscopy was then brought in to check rotation on the AP image and to mark the levels on the lateral image. On the lateral image, it was noted that her spondylolisthesis had reduced with positioning. The patient's skin was then prepped and draped in a  standard, sterile fashion. A  time out was performed that identified the patient, the procedure, and the operative levels. All team members agreed with what was stated in the time out.   A midline incision over the spinous processes of the previously marked levels was made and sharp dissection was continued down through the skin and dermis. Electrocautery was then used to continue the midline dissection down to the level of the spinous process. Subperiosteal dissection was performed using electrocautery to expose the lamina out lateral to the facet joint capsule at L4/5. The L5 screw was visible at this point. Electrocautery was used to expose the bilateral L5 and S1 screws. The rods were also exposed. Subperiosteal dissection with electrocautery was then done more cranially to expose the L4 lamina, pars, and the L3/4 facet joint. Care was taken not to violate the facet capsules. Electrocautery was used to expose the L4 transverse processes. At this point, the set caps were removed from the existing L5 and S1 screws. The rods were removed. The L5 and S1 screws were manipulated and there was no motion between the L5 and S1 segments. There was also bridging bone seen underneath the rods between the pedicle screws. The L5 and S1 pedicle screws were then removed bilaterally because they would not align with the planned pedicle screws. A ballpoint feeler was placed into the L5 and S1 screws on both sides. No breaches were felt.   Next, the pedicle screws were placed. The starting points for the pedicle screws were identified using anatomical landmarks. Fluoroscopy was brought in in the AP view to confirm the start points. Each pedicle screw was placed under AP fluoroscopy. A true AP was obtained of the vertebra where screw was being placed. Then, a pilot hole was made with a 3mm matchstick burr at the start point. A pedicle finder was advanced into the pilot hole. AP fluoroscopy was used to confirm proper starting  point. A image was taken when the pedicle finder had been advanced 10mm, 15mm, and at 20mm. The pedicle finder remained within the pedicle and did not breach medially at any of those markers. The pedicle finder was then advanced to 35mm and then withdrawn. A feeler was placed into the hole and confirmed no pedicle wall breaches. It was advanced to the ventral cortex by palpation. The screw length was then estimated off of the depth of the feeler. The widths of the screws had been predetermined as they were measured pre-operatively on the patient's advanced imaging. That measured length and pre-determined width screw was then inserted under AP fluoroscopic guidance. The screw was advanced approximately 10mm, 15mm, and then 20mm. At each one of those intervals an image was taken to confirm that the screw was following the trajectory of the pedicle and had not breached medially. The screw was then advanced fully until there was good purchase. This process was repeated for every pedicle screw.     The screws that were inserted were [employer] Reline:               Left                  Right L4 7.5x42mm 7.5x74mm L5 7.5x44mm 7.5x82mm S1 7.5x49mm 7.5x29mm   AP and lateral fluoroscopic images were then taken to visualize all the screws. The screws were in satisfactory position. The screws were all stimulated. No screw stimulated under 20.   A rongeur was used to remove the L3/4 interspinous ligaments, the L4 spinous process, the L4/5 interspinous ligaments, and  the cranial aspect of the L5 spinous process. The spinous processes, but not the interspinous ligaments or soft tissue, was saved and morselized on the back table. Bone wax was used to obtain hemostasis at the bleeding bony surfaces. A high-speed burr was used to thin the lamina of L4 and L5 to the level of the ligamentum flavum. Above the level of the ligamentum, the lamina was thinned with the burr to the approximate level of the ligamentum. A series of  Kerrison rongeurs were used to remove the thinned lamina at L4 and L5. The Kerrison rongeurs were also used to remove the ligamentum overlying the thecal sac.Next, a woodsen was used to palpate the pedicle wall at L4 on the right. The woodsen was then passed below the L4 pedicle. A kerrison was then put top of the woodsen to carefully remove the soft tissue and bone overlying the exiting nerve root. These instruments were then removed and then a curette was placed into the foramen and was used to remove further tissue overlying the nerve root. A woodsen was put back into the foramen and no further areas of stenosis were palpated.  A woodsen was placed into the laminectomy site to palpate for any remaining areas of stenosis. There was no further right sided lateral recess or foraminal stenosis palpated. A rod was placed into the pedicle screws and set screws were tightened over the rod into each pedicle screw. The same process was repeated for the contralateral side. All the set screws were final tightened. An AP fluoroscopic film was taken showing the laminectomy defect and a penfield being passed into the foramen were the decompression was done. Both images demonstrated satisfactory position of the instrumentation. The wound was copiously irrigated with sterile saline.   A high speed burr was used to decorticate the lamina at L4 and L5. It was also used to decorticate the transverse processes at L4 and L5. The locally harvested and morselized autograft and demineralized bone matrix were placed over the decorticated bone. All bony fragments that inadvertently went into the laminectomy defect were removed. The decompressed area was again palpated with a woodsen which confirmed satisfactory decompression.   1g of vancomycin powder was placed into the wound. A medium hemovac drain was placed deep to the fascia. The fascia was reapproximated with 0 vicryl suture. Final neuromonitoring signals were checked and  there as no change from baseline. A subcutaneous hemovac drain was placed above the fascia. The subcutaneous fat was reapproximated with 0 vicryl suture. The deep dermal layer was reapproximated with 2-0 viryl. The skin as closed with a 3-0 running moncryl. All counts were correct at the end of the case. The incision was dressed with steri strips and benzoin. An island dressing was placed over the wound. The patient was transferred back to a bed and brought to the post-anesthesia care unit by anesthesia staff in stable condition.  Post-operative plan: The patient will recover in the post-anesthesia care unit and then go to the floor. The patient will receive two post-operative doses of clindamycin. The patient will be out of bed as tolerated with no brace. The patient will work with physical therapy. If the output slows, the drains will be removed before discharge. Otherwise, I will plan to remove them in the office a few days after discharge. The patient's disposition will be determined based off how she does on the floor post-operatively.       Willia Craze, MD Orthopedic Surgeon

## 2022-08-31 NOTE — Progress Notes (Signed)
Orthopedic Surgery Post-operative Progress Note  Assessment: Patient is a 57 y.o. female who is currently admitted after undergoing L4/5 laminectomy, right-sided foraminotomy, and extension of her fusion   Plan: -Operative plans complete -Needs upright films when able -Drains to be maintained until output slows -Out of bed as tolerated, no brace -No bending/lifting/twisting greater than 10 pounds -PT evaluate and treat -Pain control -Regular diet -No chemoprophylaxis for dvt or antiplatelets for 72 hours after surgery -Clindamycin x2 post-operative doses -Disposition: to the floor from PACU  COPD -continue home albuterol  GERD -continue home H2 antagonist  Bipolar -continue home aripiprazole  Anxiety -continue home amitriptyline and buspar  CAD -continue home nitroglycerin as needed for chest pain -no longer taking plavix or aspiring, will hold due to spine surgery anyway  HTN -patient no longer taking any home medications -will monitor her blood pressure -was previously on lisinopril 20mg  daily, will add this back if she is hypertensive post-operatively  Sleep apnea -patient to use her home CPAP  Chronic pain -patient on baseline oxycodone, will increase dose post-operatively -continue home gabapentin  ___________________________________________________________________________   Subjective: No acute events since surgery. Recovering in PACU. Pain well controlled.  Objective:  General: no acute distress, appropriate affect Neurologic: alert, answering questions appropriately, following commands Respiratory: unlabored breathing on room air Skin: dressing clear/dry/intact, drains with sanguinous output  MSK (spine):  -Strength exam      Right  Left  EHL    4/5  4/5 TA    5/5  5/5 GSC    5/5  5/5 Knee extension  5/5  5/5 Hip flexion   5/5  5/5  -Sensory exam    Sensation intact to light touch in L3-S1 nerve distributions of bilateral lower  extremities   Patient name: [name] Patient MRN: [id number] Date: 08/31/22

## 2022-08-31 NOTE — Anesthesia Procedure Notes (Signed)
Arterial Line Insertion Start/End5/06/2022 1:20 PM, 08/31/2022 1:23 PM Performed by: Atilano Median, DO, anesthesiologist  Patient location: Pre-op. Preanesthetic checklist: patient identified, IV checked, site marked, risks and benefits discussed, surgical consent, monitors and equipment checked, pre-op evaluation, timeout performed and anesthesia consent Lidocaine 1% used for infiltration Left, radial was placed Catheter size: 20 G Hand hygiene performed  and maximum sterile barriers used   Attempts: 1 Procedure performed without using ultrasound guided technique. Following insertion, dressing applied and Biopatch. Post procedure assessment: normal and unchanged  Patient tolerated the procedure well with no immediate complications.

## 2022-08-31 NOTE — Brief Op Note (Signed)
08/31/2022  6:54 PM  PATIENT:  Jeanelle Malling  57 y.o. female  PRE-OPERATIVE DIAGNOSIS:  LUMBAR RADICULOPATHY, UNSTABLE L4-5 SPONDYLOLISTHESIS  POST-OPERATIVE DIAGNOSIS:  LUMBAR RADICULOPATHY, UNSTABLE L4-5 SPONDYLOLISTHESIS  PROCEDURE:  Procedure(s): L4-5 POSTERIOR SPINAL FUSION, L4-5 LAMINECTOMY AND RIGHT SIDED FORAMINOTOMY/ L4-S1 POSTERIOR INSTRUMENTATION (N/A)  SURGEON:  Surgeon(s) and Role:    London Sheer, MD - Primary  PHYSICIAN ASSISTANT:   ASSISTANTS: none   ANESTHESIA:   general  EBL:  250 mL   BLOOD ADMINISTERED:none  DRAINS:  2 medium hemovac drains    LOCAL MEDICATIONS USED:  NONE  SPECIMEN:  No Specimen  DISPOSITION OF SPECIMEN:  N/A  COUNTS:  YES  TOURNIQUET:  NONE  DICTATION: .Written in EPIC  PLAN OF CARE: Admit for overnight observation  PATIENT DISPOSITION:  PACU - hemodynamically stable.   Delay start of Pharmacological VTE agent (>24hrs) due to surgical blood loss or risk of bleeding: yes

## 2022-09-01 DIAGNOSIS — J449 Chronic obstructive pulmonary disease, unspecified: Secondary | ICD-10-CM | POA: Diagnosis present

## 2022-09-01 DIAGNOSIS — I2510 Atherosclerotic heart disease of native coronary artery without angina pectoris: Secondary | ICD-10-CM | POA: Diagnosis present

## 2022-09-01 DIAGNOSIS — Z8673 Personal history of transient ischemic attack (TIA), and cerebral infarction without residual deficits: Secondary | ICD-10-CM | POA: Diagnosis not present

## 2022-09-01 DIAGNOSIS — M5416 Radiculopathy, lumbar region: Secondary | ICD-10-CM | POA: Diagnosis present

## 2022-09-01 DIAGNOSIS — M4316 Spondylolisthesis, lumbar region: Secondary | ICD-10-CM | POA: Diagnosis present

## 2022-09-01 DIAGNOSIS — K219 Gastro-esophageal reflux disease without esophagitis: Secondary | ICD-10-CM | POA: Diagnosis present

## 2022-09-01 DIAGNOSIS — Z87442 Personal history of urinary calculi: Secondary | ICD-10-CM | POA: Diagnosis not present

## 2022-09-01 DIAGNOSIS — Z79899 Other long term (current) drug therapy: Secondary | ICD-10-CM | POA: Diagnosis not present

## 2022-09-01 DIAGNOSIS — D62 Acute posthemorrhagic anemia: Secondary | ICD-10-CM | POA: Diagnosis not present

## 2022-09-01 DIAGNOSIS — G8929 Other chronic pain: Secondary | ICD-10-CM | POA: Diagnosis present

## 2022-09-01 DIAGNOSIS — G4730 Sleep apnea, unspecified: Secondary | ICD-10-CM | POA: Diagnosis present

## 2022-09-01 DIAGNOSIS — Z853 Personal history of malignant neoplasm of breast: Secondary | ICD-10-CM | POA: Diagnosis not present

## 2022-09-01 DIAGNOSIS — I10 Essential (primary) hypertension: Secondary | ICD-10-CM | POA: Diagnosis present

## 2022-09-01 DIAGNOSIS — D571 Sickle-cell disease without crisis: Secondary | ICD-10-CM | POA: Diagnosis present

## 2022-09-01 DIAGNOSIS — F419 Anxiety disorder, unspecified: Secondary | ICD-10-CM | POA: Diagnosis present

## 2022-09-01 DIAGNOSIS — M48061 Spinal stenosis, lumbar region without neurogenic claudication: Secondary | ICD-10-CM | POA: Diagnosis present

## 2022-09-01 LAB — CBC
HCT: 28.6 % — ABNORMAL LOW (ref 36.0–46.0)
Hemoglobin: 9.3 g/dL — ABNORMAL LOW (ref 12.0–15.0)
MCH: 23.1 pg — ABNORMAL LOW (ref 26.0–34.0)
MCHC: 32.5 g/dL (ref 30.0–36.0)
MCV: 71.1 fL — ABNORMAL LOW (ref 80.0–100.0)
Platelets: 298 10*3/uL (ref 150–400)
RBC: 4.02 MIL/uL (ref 3.87–5.11)
RDW: 15.9 % — ABNORMAL HIGH (ref 11.5–15.5)
WBC: 9 10*3/uL (ref 4.0–10.5)
nRBC: 0 % (ref 0.0–0.2)

## 2022-09-01 LAB — BASIC METABOLIC PANEL
Anion gap: 8 (ref 5–15)
BUN: 12 mg/dL (ref 6–20)
CO2: 19 mmol/L — ABNORMAL LOW (ref 22–32)
Calcium: 8.7 mg/dL — ABNORMAL LOW (ref 8.9–10.3)
Chloride: 108 mmol/L (ref 98–111)
Creatinine, Ser: 0.72 mg/dL (ref 0.44–1.00)
GFR, Estimated: >60 mL/min (ref >60)
Glucose, Bld: 164 mg/dL — ABNORMAL HIGH (ref 70–99)
Potassium: 3.9 mmol/L (ref 3.5–5.1)
Sodium: 135 mmol/L (ref 135–145)

## 2022-09-01 MED ORDER — ALPRAZOLAM 0.5 MG PO TABS
0.5000 mg | ORAL_TABLET | Freq: Three times a day (TID) | ORAL | Status: DC | PRN
  Administered 2022-09-01 – 2022-09-02 (×2): 0.5 mg via ORAL
  Filled 2022-09-01 (×3): qty 1

## 2022-09-01 MED ORDER — MORPHINE SULFATE (PF) 2 MG/ML IV SOLN: 2.0000 mg | INTRAVENOUS | Status: DC | PRN

## 2022-09-01 MED ORDER — MORPHINE SULFATE (PF) 4 MG/ML IV SOLN
4.0000 mg | INTRAVENOUS | Status: DC | PRN
  Administered 2022-09-01 – 2022-09-03 (×5): 4 mg via INTRAVENOUS
  Filled 2022-09-01 (×5): qty 1

## 2022-09-01 MED ORDER — MORPHINE SULFATE (PF) 2 MG/ML IV SOLN
2.0000 mg | INTRAVENOUS | Status: DC | PRN
  Administered 2022-09-01 (×2): 2 mg via INTRAVENOUS
  Filled 2022-09-01 (×2): qty 1

## 2022-09-01 NOTE — Progress Notes (Signed)
Orthopedic Surgery Post-operative Progress Note  Assessment: Patient is a 57 y.o. female who is currently admitted after undergoing L4/5 laminectomy, right-sided foraminotomy, and extension of her fusion   Plan: -Operative plans complete -Needs upright films when able -Drains to be maintained until output slows -Out of bed as tolerated, no brace -No bending/lifting/twisting greater than 10 pounds -PT evaluate and treat -Pain control -Regular diet -No chemoprophylaxis for dvt or antiplatelets for 72 hours after surgery -Clindamycin x2 post-operative doses -Disposition: remain floor status  Acute anemia from surgical blood loss -hgb 9.3 this morning, will monitor for signs/symptoms of anemia  COPD -continue home albuterol  GERD -continue home H2 antagonist  Bipolar -continue home aripiprazole  Anxiety -continue home amitriptyline and buspar  CAD -continue home nitroglycerin as needed for chest pain -no longer taking plavix or aspiring, will hold due to spine surgery anyway  HTN -patient no longer taking any home medications -will monitor her blood pressure -was previously on lisinopril 20mg  daily, will add this back if she is hypertensive post-operatively  Sleep apnea -patient to use her home CPAP  Chronic pain -patient on baseline oxycodone, will increase dose post-operatively -continue home gabapentin -will likely remain hospitalized for acute post-op pain as she is needing IV narcotics to control her pain  ___________________________________________________________________________   Subjective: Had an episode last night where her right leg went completely numb. She felt it was numb in all distributions. It lasted about 1hr and then sensation returned to normal. There was no specific motion or activity that preceded the onset of this numbness. No other events overnight. Having a lot of pain in her back and is requiring IV medications for pain control. No  radiating leg pain. Denies paresthesias and numbness.   Objective:  General: no acute distress, appropriate affect Neurologic: alert, answering questions appropriately, following commands Respiratory: unlabored breathing on room air Skin: dressing clear/dry/intact, drains with sanguinous output  MSK (spine):  -Strength exam      Right  Left  EHL    4/5  4/5 TA    5/5  5/5 GSC    5/5  5/5 Knee extension  5/5  5/5 Hip flexion   5/5  5/5  -Sensory exam    Sensation intact to light touch in L3-S1 nerve distributions of bilateral lower extremities   Patient name: [name] Patient MRN: [id number] Date: 09/01/22

## 2022-09-01 NOTE — Evaluation (Signed)
Occupational Therapy Evaluation Patient Details Name: [name] MRN: [id number] DOB: [birth year] Today's Date: 09/01/2022   History of Present Illness Patient is a 57 y.o. female admitted 08/31/22 for planned L4/5 laminectomy, right-sided foraminotomy, and extension of her fusion.  PMH includes Anxiety, Arthritis, Asthma, Beta thalassemia trait, Bipolar disorder , Breast cancer, Bronchitis, Complication of anesthesia, Coronary artery disease, COVID, CVA, Depression, Headache, History of hiatal hernia, History of kidney stones, HTN, Pancreatitis, Pneumonia, Shortness of breath dyspnea, Sickle cell anemia, Sleep apnea, Somatization disorder, Tubal ligation; Cesarean section; Partial hysterectomy; Cardiac catheterization; Breast lumpectomy; Lumbar fusion (10/26/2014); Mastectomy(2016).   Clinical Impression   Pt is typically independent in ADL and mobility, she is currently min guard for transfers and short mobility with RW. She plans on going home to sisters mobile home - and she is concerned because her sister is not healthy and requires assist for transfers. Back handout provided and back precaution education provided throughout session. Pt able to repeat precautions, but struggles in application during functional tasks from bed mobilty to accessing LB for dressing. AT this time recommend continued OT in the hospital setting (plan to bring AE kit next session and provide education) as well as PT evaluation and HHOT to maximize safety and independence in ADL and functional transfers.      Recommendations for follow up therapy are one component of a multi-disciplinary discharge planning process, led by the attending physician.  Recommendations may be updated based on patient status, additional functional criteria and insurance authorization.   Assistance Recommended at Discharge PRN  Patient can return home with the following A little help with walking and/or transfers;A little help with  bathing/dressing/bathroom;Assistance with cooking/housework;Assist for transportation;Help with stairs or ramp for entrance    Functional Status Assessment  Patient has had a recent decline in their functional status and demonstrates the ability to make significant improvements in function in a reasonable and predictable amount of time.  Equipment Recommendations  Other (comment);BSC/3in1 (RW)    Recommendations for Other Services PT consult     Precautions / Restrictions Precautions Precautions: Back Precaution Booklet Issued: Yes (comment) Required Braces or Orthoses:  (no brace) Restrictions Weight Bearing Restrictions: No Other Position/Activity Restrictions: no bending, lifting, twisting      Mobility Bed Mobility Overal bed mobility: Needs Assistance Bed Mobility: Sit to Sidelying         Sit to sidelying: Min guard, HOB elevated General bed mobility comments: cues to keep hips and shoulder in alignment. Pt prefers sidelying position, decreased awareness of lines and drains. Pt cued for twisting multiple times while trying to get comfortable    Transfers Overall transfer level: Needs assistance Equipment used: Rolling walker (2 wheels) Transfers: Sit to/from Stand Sit to Stand: Min guard           General transfer comment: increased time and effort. cues for safe hand placement      Balance Overall balance assessment: Mild deficits observed, not formally tested                                         ADL either performed or assessed with clinical judgement   ADL Overall ADL's : Needs assistance/impaired Eating/Feeding: Modified independent;Sitting Eating/Feeding Details (indicate cue type and reason): finishing up lunch when OT entered room Grooming: Min guard;Cueing for sequencing;Cueing for compensatory techniques;Standing   Upper Body Bathing: Min guard;Cueing for compensatory techniques  Lower Body Bathing: Moderate  assistance;Sitting/lateral leans Lower Body Bathing Details (indicate cue type and reason): educated on compensatory strategies, needs assist for knees down Upper Body Dressing : Min guard;Sitting   Lower Body Dressing: Moderate assistance;Sit to/from stand Lower Body Dressing Details (indicate cue type and reason): assist for slippers Toilet Transfer: Min guard;Ambulation;Rolling walker (2 wheels) Toilet Transfer Details (indicate cue type and reason): extra time for power up Toileting- Clothing Manipulation and Hygiene: Min guard;Cueing for back precautions;Sit to/from Nurse, children's Details (indicate cue type and reason): plans on sponge bathing - she says that her sister's tub is so high that she cannot step over it and a tub bench will not fit over it Functional mobility during ADLs: Min guard;Rolling walker (2 wheels) General ADL Comments: focused on BLT back precaution, Pt requiring mod cueing throughout session, handout provided for reinforcement     Vision Baseline Vision/History: 1 Wears glasses Ability to See in Adequate Light: 0 Adequate Patient Visual Report: No change from baseline       Perception     Praxis      Pertinent Vitals/Pain Pain Assessment Pain Assessment: Faces Faces Pain Scale: Hurts even more Pain Location: lower back, surgical site Pain Descriptors / Indicators: Discomfort, Grimacing, Sore Pain Intervention(s): Monitored during session, Repositioned (BLT precaution education)     Hand Dominance Right   Extremity/Trunk Assessment Upper Extremity Assessment Upper Extremity Assessment: Overall WFL for tasks assessed   Lower Extremity Assessment Lower Extremity Assessment: Overall WFL for tasks assessed   Cervical / Trunk Assessment Cervical / Trunk Assessment: Back Surgery   Communication Communication Communication: No difficulties   Cognition Arousal/Alertness: Awake/alert Behavior During Therapy: WFL for tasks  assessed/performed Overall Cognitive Status: Impaired/Different from baseline Area of Impairment: Safety/judgement, Problem solving, Memory                     Memory: Decreased recall of precautions   Safety/Judgement: Decreased awareness of safety   Problem Solving: Requires verbal cues General Comments: Pt able to follow commands and repeat back precuations, required cues for functional application of back precautions     General Comments       Exercises     Shoulder Instructions      Home Living Family/patient expects to be discharged to:: Private residence Living Arrangements: Other relatives (sister - who is no healthy or physically able to assist) Available Help at Discharge: Family;Available PRN/intermittently Type of Home: Mobile home Home Access: Ramped entrance     Home Layout: One level     Bathroom Shower/Tub: Tub/shower unit;Sponge bathes at baseline (very high ledge)   Bathroom Toilet: Standard Bathroom Accessibility: No   Home Equipment: None          Prior Functioning/Environment Prior Level of Function : Independent/Modified Independent             Mobility Comments: no DME ADLs Comments: sponge bathes mainly at baseline        OT Problem List: Decreased strength;Decreased activity tolerance;Impaired balance (sitting and/or standing);Decreased safety awareness;Decreased knowledge of use of DME or AE;Decreased knowledge of precautions;Obesity;Pain      OT Treatment/Interventions: Self-care/ADL training;DME and/or AE instruction;Therapeutic activities;Patient/family education;Balance training    OT Goals(Current goals can be found in the care plan section) Acute Rehab OT Goals Patient Stated Goal: be as independent as possible OT Goal Formulation: With patient Time For Goal Achievement: 09/15/22 Potential to Achieve Goals: Good ADL Goals Pt Will Perform Grooming: with  modified independence;standing Pt Will Perform Upper Body  Dressing: with modified independence;sitting Pt Will Perform Lower Body Dressing: with supervision;sit to/from stand Pt Will Transfer to Toilet: with supervision;ambulating Pt Will Perform Toileting - Clothing Manipulation and hygiene: with supervision;sit to/from stand Additional ADL Goal #1: Pt will verbalize and maintain back precautions throughout session with less than 2 cues including bed mobility and ADL  OT Frequency: Min 2X/week    Co-evaluation              AM-PAC OT "6 Clicks" Daily Activity     Outcome Measure Help from another person eating meals?: None Help from another person taking care of personal grooming?: A Little Help from another person toileting, which includes using toliet, bedpan, or urinal?: A Little Help from another person bathing (including washing, rinsing, drying)?: A Lot Help from another person to put on and taking off regular upper body clothing?: A Little Help from another person to put on and taking off regular lower body clothing?: A Lot 6 Click Score: 17   End of Session Equipment Utilized During Treatment: Gait belt;Rolling walker (2 wheels) Nurse Communication: Mobility status;Precautions  Activity Tolerance: Patient tolerated treatment well Patient left: in bed;with call bell/phone within reach;with bed alarm set  OT Visit Diagnosis: Unsteadiness on feet (R26.81);Muscle weakness (generalized) (M62.81);Pain;Other abnormalities of gait and mobility (R26.89) Pain - Right/Left:  (central) Pain - part of body:  (back)                Time: 0102-7253 OT Time Calculation (min): 31 min Charges:  OT General Charges $OT Visit: 1 Visit OT Evaluation $OT Eval Moderate Complexity: 1 Mod OT Treatments $Self Care/Home Management : 8-22 mins  Nyoka Cowden OTR/L Acute Rehabilitation Services Office: [phone]  Evern Bio [facility] 09/01/2022, 6:15 PM

## 2022-09-01 NOTE — Anesthesia Postprocedure Evaluation (Signed)
Anesthesia Post Note  Patient: [name]  Procedure(s) Performed: L4-5 POSTERIOR SPINAL FUSION, L4-5 LAMINECTOMY AND RIGHT SIDED FORAMINOTOMY/ L4-S1 POSTERIOR INSTRUMENTATION     Patient location during evaluation: PACU Anesthesia Type: General Level of consciousness: awake and alert Pain management: pain level controlled Vital Signs Assessment: post-procedure vital signs reviewed and stable Respiratory status: spontaneous breathing, nonlabored ventilation, respiratory function stable and patient connected to nasal cannula oxygen Cardiovascular status: blood pressure returned to baseline and stable Postop Assessment: no apparent nausea or vomiting Anesthetic complications: no   No notable events documented.  Last Vitals:  Vitals:   08/31/22 2000 08/31/22 2322  BP: (!) 145/82 (!) 143/69  Pulse: 95 88  Resp: 16 18  Temp: 36.7 C 37.1 C  SpO2: 98% 95%    Last Pain:  Vitals:   09/01/22 0051  TempSrc:   PainSc: Asleep                 Nelle Don Kaydan Wilhoite

## 2022-09-02 ENCOUNTER — Inpatient Hospital Stay (HOSPITAL_COMMUNITY): Payer: Medicare (Managed Care)

## 2022-09-02 ENCOUNTER — Other Ambulatory Visit: Payer: Self-pay

## 2022-09-02 NOTE — Progress Notes (Addendum)
Orthopedic Surgery Post-operative Progress Note  Assessment: Patient is a 57 y.o. female who is currently admitted after undergoing L4/5 laminectomy, right-sided foraminotomy, and extension of her fusion   Plan: -Operative plans complete -Needs upright films when able -Drains to be pulled tomorrow morning -Out of bed as tolerated, no brace -No bending/lifting/twisting greater than 10 pounds -PT evaluate and treat -Pain control -Regular diet -No chemoprophylaxis for dvt or antiplatelets for 72 hours after surgery -Anticipate discharge to home tomorrow  Acute anemia from surgical blood loss -hgb 9.3 yesterday, will monitor for signs/symptoms of anemia  COPD -continue home albuterol  GERD -continue home H2 antagonist  Bipolar -continue home aripiprazole  Anxiety -continue home amitriptyline and buspar  CAD -continue home nitroglycerin as needed for chest pain -no longer taking plavix or aspiring, will hold due to spine surgery anyway  HTN -patient no longer taking any home medications -will monitor her blood pressure (most recent 138/93) -was previously on lisinopril 20mg  daily, will add this back if she is hypertensive post-operatively  Sleep apnea -patient to use her home CPAP  Chronic pain -patient on baseline oxycodone, will increase dose post-operatively -continue home gabapentin -will remain hospitalized for acute post-op pain as she is still needing IV narcotics to control her pain -she said she has talked to her pain management doctor about surgery, I instructed her to talk to their office again as I will be giving her additional narcotics to go home with to control her increased pain from surgery  ___________________________________________________________________________   Subjective: No acute events overnight. Back pain getting better but is still significant. No pain radiating into her legs. Worked with PT yesterday and said it was painful but she felt  it went well. Was able to get herself out of bed this morning and walk to the bathroom without assistance. Is hoping to go home tomorrow. Denies paresthesias and numbness.  Objective:  General: no acute distress, appropriate affect Neurologic: alert, answering questions appropriately, following commands Respiratory: unlabored breathing on room air Skin: dressing clear/dry/intact, drains with serosanguinous output  MSK (spine):  -Strength exam      Right  Left  EHL    4/5  4/5 TA    5/5  5/5 GSC    5/5  5/5 Knee extension  5/5  5/5 Hip flexion   5/5  5/5  -Sensory exam    Sensation intact to light touch in L3-S1 nerve distributions of bilateral lower extremities   Patient name: [name] Patient MRN: [id number] Date: 09/02/22

## 2022-09-02 NOTE — Discharge Summary (Signed)
Orthopedic Surgery Discharge Summary  Patient name: [name] Patient MRN: [id number] Admit today: 08/31/2022 Discharge date: 09/03/2022  Attending physician: Willia Craze, MD Final diagnosis: L4/5 unstable spondylolisthesis, lumbar radiculopathy Findings: L4/5 spondylolisthesis, successful fusion at L5/S1   Hospital course: Patient is a 57 y.o. female who was admitted after undergoing L4/5 laminectomy, foraminotomy, and posterior instrumented spinal fusion. The patient had significant pain immediately after surgery. It took several days of IV narcotics to control her pain given her pre-operative narcotic usage but pain eventually was controlled with a multimodal regimen including oxycodone. Labs during the hospitalization revealed acute anemia from surgical blood loss with a hemoglobin of 9.3. She did not require a transfusion and she was monitored for signs/symptoms of anemia, but did not develop any. The patient worked with physical therapy who recommended discharge to home. The patient was tolerating an oral diet without issue and was voiding spontaneously after surgery. The patient's vitals were stable on the day of discharge. The patient's drains were removed on the day of discharge. The patient was medically ready for discharge and was discharge to home on post-operative day 3.  Instructions:   Orthopedic Surgery Discharge Instructions  Patient name: [name] Procedure Performed: L4/5 laminectomy, foraminotomy, and posterior instrumented spinal fusion Date of Surgery: 08/31/2022 Surgeon: Willia Craze, MD  Pre-operative Diagnosis: L4/5 unstable spondylolisthesis, L4/5 foraminal stenosis, lumbar radiculopathy Post-operative Diagnosis: same as above  Discharge Date: 09/03/2022 Discharged to: home Discharge Condition: stable  Activity: You should refrain from bending, lifting, or twisting with objects greater than ten pounds until three months after surgery. You are  encouraged to walk as much as desired. You can perform household activities such as cleaning dishes, doing laundry, vacuuming, etc. as long as the ten-pound restriction is followed. You do not need to wear a brace during the post-operative period.   Incision Care: Your incision site has a dressing over it. That dressing should remain in place and dry at all times for a total of one week after surgery. After one week, you can remove the dressing. Underneath the dressing, you will find pieces of tape. You should leave these pieces of tape in place. They will fall off with time. Do not pick, rub, or scrub at them. Do not put cream or lotion over the surgical area. After one week and once the dressing is off, it is okay to let soap and water run over your incision. Again, do not pick, scrub, or rub at the pieces of tape when bathing. Do not submerge (e.g., take a bath, swim, go in a hot tub, etc.) until six weeks after surgery. There may be some bloody drainage from the incision into the dressing after surgery. This is normal. You do not need to replace the dressing. Continue to leave it in place for the one week as instructed above. Should the dressing become saturated with blood or drainage, please call the office for further instructions.   Medications: You have been prescribed oxycodone. This is a narcotic pain medication and should be taken in addition to the 20mg  you have been prescribed by your pain management doctor. You should not drink alcohol or operate heavy machinery (including driving) while taking this medication. The oxycodone can cause constipation as a side effect. For that reason, you have been prescribed senna and miralax. These are both laxatives. You do not need to take this medication if you develop diarrhea. Should you remain constipated even while taking these medications, please increase the dose  of miralax to twice daily. Tylenol has been prescribed to be taken every 8 hours, which will  give you additional pain relief. Robaxin is a muscle relaxer that has been prescribed to you for muscle spasm type pain. Take this medication as needed.   Do not take NSAIDs (ibuprofen, Aleve, Celebrex, naproxen, meloxicam, etc.) for the first 6 weeks after surgery as there is some evidence that their use may decrease the chances of successful fusion.   In order to set expectations for opioid prescriptions, you will only be prescribed additional opioids for a total of six weeks after surgery and, at two-weeks after surgery, your opioid prescription will start to tapered (decreased dosage and number of pills). If you are already established with a provider that is giving you opioid medications, you should schedule an appointment with them for six weeks after surgery if you feel you are going to need another prescription in addition to what they have already prescribed you. State law only allows for opioid prescriptions one week at a time. If you are running out of opioid medication near the end of the week, please call the office during business hours before running out so I can send you another prescription.   You may resume any home blood thinners (warfarin, lovenox, apixaban, plavix, xarelto, etc) 72 hours after your surgery. Take these medications as they were previously prescribed.  Driving: You should not drive while taking narcotic pain medications. You should start getting back to driving slowly and you may want to try driving in a parking lot before doing anything more.   Diet: You are safe to resume your regular diet after surgery.   Reasons to Call the Office After Surgery: You should feel free to call the office with any concerns or questions you have in the post-operative period, but you should definitely notify the office if you develop: -shortness of breath, chest pain, or trouble breathing -excessive bleeding, drainage, redness, or swelling around the surgical site -fevers, chills, or  pain that is getting worse with each passing day -persistent nausea or vomiting -new weakness in either leg -new or worsening numbness or tingling in either leg -numbness in the groin, bowel or bladder incontinence -other concerns about your surgery  Follow Up Appointments: You should have an office appointment scheduled for approximately two weeks after surgery. If you do not remember when this appointment is or do not already have it scheduled, please call the office to schedule.   Office Information:  -Willia Craze, MD -Phone number: [phone] -Address: [address]       [place], [place] [address]

## 2022-09-02 NOTE — Discharge Instructions (Addendum)
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 You are encouraged to walk as much as desired. You can perform household activities such as cleaning dishes, doing laundry, vacuuming, etc. as long as the ten-pound restriction is followed. You do not need to wear a brace during the post-operative period.   Incision Care: Your incision site has a dressing over it. That dressing should remain in place and dry at all times for a total of one week after surgery. After one week, you can remove the dressing. Underneath the dressing, you will find pieces of tape. You should leave these pieces of tape in place. They will fall off with time. Do not pick, rub, or scrub at them. Do not put cream or lotion over the surgical area. After one week and once the dressing is off, it is okay to let soap and water run over your incision. Again, do not pick, scrub, or rub at the pieces of tape when bathing. Do not submerge (e.g., take a bath, swim, go in a hot tub, etc.) until six weeks after surgery. There may be some bloody drainage from the incision into the dressing after surgery. This is normal. You do not need to replace the dressing. Continue to leave it in place for the one week as instructed above. Should the dressing become saturated with blood or drainage, please call the office for further instructions.   Medications: You have been prescribed oxycodone. This is a narcotic pain medication  and should be taken in addition to the 20mg  you have been prescribed by your pain management doctor. You should not drink alcohol or operate heavy machinery (including driving) while taking this medication. The oxycodone can cause constipation as a side effect. For that reason, you have been prescribed senna and miralax. These are both laxatives. You do not need to take this medication if you develop diarrhea. Should you remain constipated even while taking these medications, please increase the dose of miralax to twice daily. Tylenol has been prescribed to be taken every 8 hours, which will give you additional pain relief. Robaxin is a muscle relaxer that has been prescribed to you for muscle spasm type pain. Take this medication as needed.   Do not take NSAIDs (ibuprofen, Aleve, Celebrex, naproxen, meloxicam, etc.) for the first 6 weeks after surgery as there is some evidence that their use may decrease the chances of successful fusion.   In order to set expectations for opioid prescriptions, you will only be prescribed additional opioids for a total of six weeks after surgery and, at two-weeks after surgery, your opioid prescription will start to tapered (decreased dosage and number of pills). If you are already established with a provider that is giving you opioid medications, you should schedule an appointment with them for six weeks after surgery if you feel you are going to need another prescription in addition to what they have already prescribed you. State law only allows for opioid prescriptions one week at a time. If you are running out of opioid medication near the end of the week, please call the office during business hours  before running out so I can send you another prescription.   You may resume any home blood thinners (warfarin, lovenox, apixaban, plavix, xarelto, etc) 72 hours after your surgery. Take these medications as they were previously prescribed.  Driving: You should not drive  while taking narcotic pain medications. You should start getting back to driving slowly and you may want to try driving in a parking lot before doing anything more.   Diet: You are safe to resume your regular diet after surgery.   Reasons to Call the Office After Surgery: You should feel free to call the office with any concerns or questions you have in the post-operative period, but you should definitely notify the office if you develop: -shortness of breath, chest pain, or trouble breathing -excessive bleeding, drainage, redness, or swelling around the surgical site -fevers, chills, or pain that is getting worse with each passing day -persistent nausea or vomiting -new weakness in either leg -new or worsening numbness or tingling in either leg -numbness in the groin, bowel or bladder incontinence -other concerns about your surgery  Follow Up Appointments: You should have an office appointment scheduled for approximately two weeks after surgery. If you do not remember when this appointment is or do not already have it scheduled, please call the office to schedule.   Office Information:  -Willia Craze, MD -Phone number: [phone] -Address: [address]       [place], [place] [address]

## 2022-09-03 ENCOUNTER — Other Ambulatory Visit: Payer: Self-pay | Admitting: Orthopedic Surgery

## 2022-09-03 ENCOUNTER — Telehealth: Payer: Self-pay | Admitting: Orthopedic Surgery

## 2022-09-03 MED ORDER — TIZANIDINE HCL 4 MG PO TABS: 4.0000 mg | ORAL_TABLET | Freq: Four times a day (QID) | ORAL | 0 refills | Status: DC | PRN

## 2022-09-03 MED ORDER — OXYCODONE HCL 5 MG PO TABS
30.0000 mg | ORAL_TABLET | ORAL | Status: DC | PRN
  Administered 2022-09-03 (×2): 30 mg via ORAL
  Filled 2022-09-03 (×2): qty 6

## 2022-09-03 MED ORDER — POLYETHYLENE GLYCOL 3350 17 G PO PACK: 17.0000 g | PACK | Freq: Every day | ORAL | 0 refills | Status: DC

## 2022-09-03 MED ORDER — LISINOPRIL 20 MG PO TABS
20.0000 mg | ORAL_TABLET | Freq: Every day | ORAL | Status: DC
  Filled 2022-09-03: qty 1

## 2022-09-03 MED ORDER — SENNA 8.6 MG PO TABS: 1.0000 | ORAL_TABLET | Freq: Two times a day (BID) | ORAL | 0 refills | Status: AC

## 2022-09-03 MED ORDER — METHOCARBAMOL 500 MG PO TABS: 500.0000 mg | ORAL_TABLET | Freq: Four times a day (QID) | ORAL | 0 refills | Status: DC

## 2022-09-03 MED ORDER — OXYCODONE HCL 5 MG PO TABS: 5.0000 mg | ORAL_TABLET | ORAL | 0 refills | Status: DC | PRN

## 2022-09-03 MED ORDER — ACETAMINOPHEN 500 MG PO TABS: 1000.0000 mg | ORAL_TABLET | Freq: Three times a day (TID) | ORAL | 0 refills | Status: AC

## 2022-09-03 MED FILL — Thrombin For Soln Kit 20000 Unit: CUTANEOUS | Qty: 1 | Status: AC

## 2022-09-03 NOTE — Progress Notes (Signed)
Orthopedic Surgery Post-operative Progress Note  Assessment: Patient is a 57 y.o. female who is currently admitted after undergoing L4/5 laminectomy, right-sided foraminotomy, and extension of her fusion   Plan: -Operative plans complete -Drains removed this morning -Out of bed as tolerated, no brace -No bending/lifting/twisting greater than 10 pounds -PT evaluate and treat -Pain control -Regular diet -No chemoprophylaxis for dvt or antiplatelets for 72 hours after surgery -Anticipate discharge to home today  Acute anemia from surgical blood loss -hgb 9.3 yesterday, will monitor for signs/symptoms of anemia  COPD -continue home albuterol  GERD -continue home H2 antagonist  Bipolar -continue home aripiprazole  Anxiety -continue home amitriptyline and buspar  CAD -continue home nitroglycerin as needed for chest pain -no longer taking plavix or aspirin, will hold due to spine surgery  HTN -patient no longer taking any home medications -will monitor her blood pressure (most recent 162/63) -will start lisinopril 20mg  daily  Sleep apnea -patient to use her home CPAP  Chronic pain -patient on baseline oxycodone, increased dose post-operatively -continue home gabapentin -discontinued IV narcotics this morning -Instructed her again to talk to her pain management office as I will be giving her additional narcotics to go home with to control her increased pain from surgery  ___________________________________________________________________________   Subjective: No acute events overnight. Back pain is getting better. Was able to get some sleep last night. Not having any pain radiating into her legs. Denies paresthesias and numbness.   Objective:  General: no acute distress, appropriate affect Neurologic: alert, answering questions appropriately, following commands Respiratory: unlabored breathing on room air Skin: dressing clear/dry/intact, drains with serosanguinous  output  MSK (spine):  -Strength exam      Right  Left  EHL    4/5  4/5 TA    5/5  5/5 GSC    5/5  5/5 Knee extension  5/5  5/5 Hip flexion   5/5  5/5  -Sensory exam    Sensation intact to light touch in L3-S1 nerve distributions of bilateral lower extremities  Deep drain output over overnight shift: 10mL Superficial drain output over overnight shift: 20mL   Patient name: [name] Patient MRN: [id number] Date: 09/03/22

## 2022-09-03 NOTE — Progress Notes (Signed)
Mobility Specialist Progress Note   09/03/22 1005  Mobility  Activity Ambulated independently in room;Stood at bedside;Dangled on edge of bed  Level of Assistance Independent  Assistive Device None  Distance Ambulated (ft) 25 ft  Range of Motion/Exercises Active;All extremities  Activity Response Tolerated well   Patient received dangling EOB and agreeable to participate. Completed review and re-edu of precautions as patient was unable to recall initially. Ambulated independently in room with slow gait. Grimaced in pain throughout stating she was at a 9/10. Returned to EOB without incident. Was left standing at the bedside with all needs met, call bell in reach.   [place] [employer] Belay, BS EXP Mobility Specialist Please contact via SecureChat or Rehab office at [phone]

## 2022-09-03 NOTE — Telephone Encounter (Signed)
Orthopedic Telephone Call  Patient's insurance does not pay for robaxin so sending in tizanidine.   London Sheer, MD Orthopedic Surgeon

## 2022-09-03 NOTE — TOC Transition Note (Signed)
Transition of Care [facility]) - CM/SW Discharge Note   Patient Details  Name: [name] MRN: [id number] Date of Birth: 03-10-1966  [facility] of [facility]) CM/SW Contact:  Epifanio Lesches, RN Phone Number: 09/03/2022, 11:12 AM   Clinical Narrative:    Patient will DC to: home Anticipated DC date: 09/03/2022 Family notified: yes Transport by: [facility], [phone]  L4/5 laminectomy, foraminotomy, and spinal fusion, 5/3   Per MD patient ready for DC today. RN, patient, and patient's sister notified of DC. Pt without RX med concerns. Referral made for RW with [employer] [facility] [employer]. Equipment will be delivered to bedside prior to dc/.  Post hospital f/u noted on AVS. Transportation called and arranged by pt with [facility], [phone]. Per rep pick up time  scheduled for 12:30 PM , however it can take up to 3-4 hours. Pt to pick up RX meds @ local pharmary ( [facility] [place]) in route to home. Pt to reside @ sister home to recover. Sister to assist to assist with needed care. RNCM will sign off for now as intervention is no longer needed. Please consult [place] again if new needs arise.      [facility], [phone]  Final next level of care: Home/Self Care Barriers to Discharge: No Barriers Identified   Patient Goals and CMS Choice   Choice offered to / list presented to : Patient  Discharge Placement                         Discharge Plan and Services Additional resources added to the After Visit Summary for                    DME Agency: [employer] Date DME Agency Contacted: 09/03/22 Time DME Agency Contacted: 1110 Representative spoke with at DME Agency: Vaughan Basta            Social Determinants of Health (SDOH) Interventions SDOH Screenings   Tobacco Use: Medium Risk (08/31/2022)     Readmission Risk Interventions     No data to display

## 2022-09-04 ENCOUNTER — Telehealth: Payer: Self-pay | Admitting: Orthopedic Surgery

## 2022-09-04 MED ORDER — TIZANIDINE HCL 4 MG PO TABS: 4.0000 mg | ORAL_TABLET | Freq: Four times a day (QID) | ORAL | 0 refills | Status: AC | PRN

## 2022-09-04 MED ORDER — HYDROCODONE-ACETAMINOPHEN 5-325 MG PO TABS: 1.0000 | ORAL_TABLET | ORAL | 0 refills | Status: AC | PRN

## 2022-09-04 NOTE — Addendum Note (Signed)
Addended by: Willia Craze on: 09/04/2022 05:09 PM   Modules accepted: Orders

## 2022-09-04 NOTE — Telephone Encounter (Signed)
I called patient and advised that her pain meds were sent in yesterday and they were rec'd by the pharm @ 11:32 am, she is going to call the pharm and find out why they haven't been filled.

## 2022-09-04 NOTE — Telephone Encounter (Signed)
Patient asking for technician to call due to needing several supplies and RX. Please call [phone]

## 2022-09-04 NOTE — Telephone Encounter (Signed)
Pt called stating she has been discharged from surgery with no pain medication. Please send to pharmacy on file. Pt phone number is [phone].

## 2022-09-05 ENCOUNTER — Telehealth: Payer: Self-pay | Admitting: Orthopedic Surgery

## 2022-09-05 NOTE — Telephone Encounter (Signed)
Duplicate message. 

## 2022-09-05 NOTE — Telephone Encounter (Signed)
Patient called in on triage phone and LM. States she is swelling excessively in her stomach, legs, feet. Would like call back immediately

## 2022-09-05 NOTE — Telephone Encounter (Signed)
I called pt states that she is still swelling- I advised her to go to ED and to call me tomorrow and let me know what she found out. She agreed to this. And I advised that her meds were sent in yesterday

## 2022-09-05 NOTE — Telephone Encounter (Signed)
Patient advising she is having a lot of swelling and is concerned. I have tried to transfer call to Triage, patient advising that she has left a message but no response. Please call ASAP...[phone]

## 2022-09-06 ENCOUNTER — Telehealth: Payer: Self-pay | Admitting: Orthopedic Surgery

## 2022-09-06 NOTE — Telephone Encounter (Signed)
Elene called. She is the pharmacist at the [facility]. She would like Dr. Christell Constant to know that another doctor prescribed a 30 day supply of oxycodone on 4/10 with refills remaining. Does he still want her to have the hydrocodone? Cb# is [phone]

## 2022-09-06 NOTE — Telephone Encounter (Signed)
I called and advised [name] that she had back surgery on 5/3 and that this for her break thru pain.

## 2022-09-14 ENCOUNTER — Other Ambulatory Visit: Payer: Self-pay

## 2022-09-14 ENCOUNTER — Ambulatory Visit (INDEPENDENT_AMBULATORY_CARE_PROVIDER_SITE_OTHER): Payer: Medicare HMO | Admitting: Orthopedic Surgery

## 2022-09-14 VITALS — BP 120/83 | HR 80 | Ht 65.0 in | Wt 210.0 lb

## 2022-09-14 DIAGNOSIS — M4316 Spondylolisthesis, lumbar region: Secondary | ICD-10-CM

## 2022-09-14 DIAGNOSIS — Z981 Arthrodesis status: Secondary | ICD-10-CM

## 2022-09-14 DIAGNOSIS — M5416 Radiculopathy, lumbar region: Secondary | ICD-10-CM

## 2022-09-14 MED ORDER — FLEET ENEMA 7-19 GM/118ML RE ENEM: 1.0000 | ENEMA | Freq: Every day | RECTAL | 0 refills | Status: AC | PRN

## 2022-09-14 MED ORDER — MAGNESIUM CITRATE PO SOLN: 1.0000 | Freq: Once | ORAL | 0 refills | Status: AC

## 2022-09-14 NOTE — Progress Notes (Signed)
Orthopedic Surgery Post-operative Office Visit  Procedure: L4/5 laminectomy, right-sided foraminotomy, extension of fusion to L4 Date of Surgery: 08/31/2022 (~2 weeks post-op)  Assessment: Patient is a 57 y.o. whose leg pain has improved since surgery   Plan: -Operative plans complete -Okay to let soap/water run over the incision, but do not submerge -Out of bed as tolerated, no brace -No bending/lifting/twisting greater than 10 pounds -Pain management: per her pain management doctor -Return to office in 4 weeks, lumbar x-rays needed at next visit: AP/lateral lumbar  ___________________________________________________________________________   Subjective: Patient is back at home. Her back pain has improved since surgery. She is back to using her baseline narcotics. She is not having any pain radiating into either lower extremity. Denies paresthesias and numbness. Has not noticed any redness or drainage around the incision.   Objective:  General: no acute distress, appropriate affect Neurologic: alert, answering questions appropriately, following commands Respiratory: unlabored breathing on room air Skin: incision is well approximated with no erythema, induration, or active/expressible drainage  MSK (spine):  -Strength exam      Left  Right  EHL    4/5  4/5 TA    5/5  5/5 GSC    5/5  5/5 Knee extension  5/5  5/5 Hip flexion   5/5  5/5  -Sensory exam    Sensation intact to light touch in L3-S1 nerve distributions of bilateral lower extremities  Imaging: X-rays of the lumbar spine taken 09/14/2022 were independently reviewed and interpreted, showing posterior instrumentation from L4-S1 with no lucency around the screws. Screws in similar position to immediate post-operative films on 09/02/2022. Laminectomy defect seen at L4/5.    Patient name: [name] Patient MRN: [id number] Date of visit: 09/14/22

## 2022-09-27 ENCOUNTER — Telehealth: Payer: Self-pay | Admitting: Orthopedic Surgery

## 2022-09-27 MED ORDER — HYDROCODONE-ACETAMINOPHEN 5-325 MG PO TABS: 1.0000 | ORAL_TABLET | ORAL | 0 refills | Status: AC | PRN

## 2022-09-27 NOTE — Telephone Encounter (Signed)
Patient want a call [phone]

## 2022-09-27 NOTE — Telephone Encounter (Signed)
Patient would like a refill on the Loratab.

## 2022-09-27 NOTE — Addendum Note (Signed)
Addended by: Willia Craze on: 09/27/2022 04:45 PM   Modules accepted: Orders

## 2022-10-03 ENCOUNTER — Telehealth: Payer: Self-pay | Admitting: Orthopedic Surgery

## 2022-10-03 MED ORDER — TIZANIDINE HCL 4 MG PO TABS: 4.0000 mg | ORAL_TABLET | Freq: Four times a day (QID) | ORAL | 2 refills | Status: DC | PRN

## 2022-10-03 MED ORDER — GABAPENTIN 300 MG PO CAPS: 600.0000 mg | ORAL_CAPSULE | Freq: Three times a day (TID) | ORAL | 2 refills | Status: AC

## 2022-10-03 NOTE — Telephone Encounter (Signed)
Patient called needing Rx refilled Gabapentin and Tizanidine. The number to contact patient is [phone]

## 2022-10-17 ENCOUNTER — Other Ambulatory Visit (INDEPENDENT_AMBULATORY_CARE_PROVIDER_SITE_OTHER): Payer: Medicaid Other

## 2022-10-17 ENCOUNTER — Ambulatory Visit (INDEPENDENT_AMBULATORY_CARE_PROVIDER_SITE_OTHER): Payer: Medicare HMO | Admitting: Orthopedic Surgery

## 2022-10-17 DIAGNOSIS — Z981 Arthrodesis status: Secondary | ICD-10-CM

## 2022-10-17 MED ORDER — HYDROCODONE-ACETAMINOPHEN 5-325 MG PO TABS: 1.0000 | ORAL_TABLET | ORAL | 0 refills | Status: AC | PRN

## 2022-10-17 NOTE — Addendum Note (Signed)
Addended by: Willia Craze on: 10/17/2022 01:32 PM   Modules accepted: Orders

## 2022-10-17 NOTE — Progress Notes (Signed)
Orthopedic Surgery Office Visit   Procedure: L4/5 laminectomy, right-sided foraminotomy, extension of fusion to L4 Date of Surgery: 08/31/2022 (~6 weeks post-op)   Assessment: Patient is a 57 y.o. whose leg pain has resolved since surgery     Plan: -Operative plans complete -Okay to submerge wound -Out of bed as tolerated, no brace -No bending/lifting/twisting greater than 10 pounds -Pain management: per her pain management doctor -Return to office in 6 weeks, lumbar x-rays needed at next visit: AP/lateral/flex/ex lumbar   ___________________________________________________________________________     Subjective: Patient has been doing well since she was last seen.  She is not having any leg pain.  She feels that her back pain has improved as well but has not completely gone away.  She is still seeing pain management for her chronic low back pain.  Denies paresthesia numbness.  Has not noticed any redness or drainage around her incision site.   Objective:   General: no acute distress, appropriate affect Neurologic: alert, answering questions appropriately, following commands Respiratory: unlabored breathing on room air Skin: incision is well healed with no erythema, induration, or active/expressible drainage   MSK (spine):   -Strength exam                                                   Left                  Right   EHL                              4/5                  4/5 TA                                 5/5                  5/5 GSC                             5/5                  5/5 Knee extension            5/5                  5/5 Hip flexion                    5/5                  5/5   -Sensory exam                           Sensation intact to light touch in L3-S1 nerve distributions of bilateral lower extremities   Imaging: X-rays of the lumbar spine taken 10/17/2022 were independently reviewed and interpreted, showing posterior instrumentation from L4-S1 with  no lucency around the screws. Screws in similar position to immediate post-operative films on 09/02/2022. Laminectomy defect seen at L4/5. There is an interbody device at L5/S1 with no lucency around it and it appears in appropriate position.  Patient name: [name] Patient MRN: [id number] Date of visit: 10/17/22

## 2022-11-13 ENCOUNTER — Telehealth: Payer: Self-pay | Admitting: Orthopedic Surgery

## 2022-11-13 NOTE — Telephone Encounter (Signed)
Patient called. Larey Seat and would like oxycodone called in.

## 2022-11-14 MED ORDER — OXYCODONE HCL 5 MG PO TABS: 5.0000 mg | ORAL_TABLET | ORAL | 0 refills | Status: AC | PRN

## 2022-11-28 ENCOUNTER — Ambulatory Visit: Payer: Medicare HMO | Admitting: Orthopedic Surgery

## 2023-01-08 ENCOUNTER — Emergency Department (HOSPITAL_BASED_OUTPATIENT_CLINIC_OR_DEPARTMENT_OTHER): Payer: Medicare Other

## 2023-01-08 ENCOUNTER — Emergency Department (HOSPITAL_BASED_OUTPATIENT_CLINIC_OR_DEPARTMENT_OTHER)
Admission: EM | Admit: 2023-01-08 | Discharge: 2023-01-08 | Disposition: A | Discharge status: Home / Self Care | Payer: Medicare Other | Attending: Emergency Medicine | Admitting: Emergency Medicine

## 2023-01-08 ENCOUNTER — Encounter (HOSPITAL_BASED_OUTPATIENT_CLINIC_OR_DEPARTMENT_OTHER): Payer: Self-pay | Admitting: Pediatrics

## 2023-01-08 ENCOUNTER — Other Ambulatory Visit: Payer: Self-pay

## 2023-01-08 DIAGNOSIS — R519 Headache, unspecified: Secondary | ICD-10-CM | POA: Diagnosis present

## 2023-01-08 DIAGNOSIS — Z79899 Other long term (current) drug therapy: Secondary | ICD-10-CM | POA: Insufficient documentation

## 2023-01-08 DIAGNOSIS — Z91040 Latex allergy status: Secondary | ICD-10-CM | POA: Diagnosis not present

## 2023-01-08 DIAGNOSIS — M7918 Myalgia, other site: Secondary | ICD-10-CM | POA: Diagnosis not present

## 2023-01-08 DIAGNOSIS — Z7982 Long term (current) use of aspirin: Secondary | ICD-10-CM | POA: Diagnosis not present

## 2023-01-08 DIAGNOSIS — Z7902 Long term (current) use of antithrombotics/antiplatelets: Secondary | ICD-10-CM | POA: Insufficient documentation

## 2023-01-08 DIAGNOSIS — Z20822 Contact with and (suspected) exposure to covid-19: Secondary | ICD-10-CM | POA: Insufficient documentation

## 2023-01-08 DIAGNOSIS — Z8673 Personal history of transient ischemic attack (TIA), and cerebral infarction without residual deficits: Secondary | ICD-10-CM | POA: Insufficient documentation

## 2023-01-08 DIAGNOSIS — R079 Chest pain, unspecified: Secondary | ICD-10-CM | POA: Insufficient documentation

## 2023-01-08 DIAGNOSIS — I2510 Atherosclerotic heart disease of native coronary artery without angina pectoris: Secondary | ICD-10-CM | POA: Insufficient documentation

## 2023-01-08 DIAGNOSIS — I10 Essential (primary) hypertension: Secondary | ICD-10-CM | POA: Insufficient documentation

## 2023-01-08 DIAGNOSIS — R062 Wheezing: Secondary | ICD-10-CM | POA: Insufficient documentation

## 2023-01-08 DIAGNOSIS — R000 Tachycardia, unspecified: Secondary | ICD-10-CM | POA: Diagnosis not present

## 2023-01-08 LAB — BASIC METABOLIC PANEL
Anion gap: 8 (ref 5–15)
BUN: 11 mg/dL (ref 6–20)
CO2: 22 mmol/L (ref 22–32)
Calcium: 8.7 mg/dL — ABNORMAL LOW (ref 8.9–10.3)
Chloride: 106 mmol/L (ref 98–111)
Creatinine, Ser: 0.7 mg/dL (ref 0.44–1.00)
GFR, Estimated: >60 mL/min (ref >60)
Glucose, Bld: 118 mg/dL — ABNORMAL HIGH (ref 70–99)
Potassium: 3.6 mmol/L (ref 3.5–5.1)
Sodium: 136 mmol/L (ref 135–145)

## 2023-01-08 LAB — CBC
HCT: 32.5 % — ABNORMAL LOW (ref 36.0–46.0)
Hemoglobin: 10.4 g/dL — ABNORMAL LOW (ref 12.0–15.0)
MCH: 22.1 pg — ABNORMAL LOW (ref 26.0–34.0)
MCHC: 32 g/dL (ref 30.0–36.0)
MCV: 69.1 fL — ABNORMAL LOW (ref 80.0–100.0)
Platelets: 301 K/uL (ref 150–400)
RBC: 4.7 MIL/uL (ref 3.87–5.11)
RDW: 16.8 % — ABNORMAL HIGH (ref 11.5–15.5)
WBC: 7.3 K/uL (ref 4.0–10.5)
nRBC: 0 % (ref 0.0–0.2)

## 2023-01-08 LAB — HEPATIC FUNCTION PANEL
ALT: 11 U/L (ref 0–44)
AST: 14 U/L — ABNORMAL LOW (ref 15–41)
Albumin: 3.6 g/dL (ref 3.5–5.0)
Alkaline Phosphatase: 63 U/L (ref 38–126)
Bilirubin, Direct: 0.1 mg/dL (ref 0.0–0.2)
Indirect Bilirubin: 0.2 mg/dL — ABNORMAL LOW (ref 0.3–0.9)
Total Bilirubin: 0.3 mg/dL (ref 0.3–1.2)
Total Protein: 7 g/dL (ref 6.5–8.1)

## 2023-01-08 LAB — RESP PANEL BY RT-PCR (RSV, FLU A&B, COVID)  RVPGX2
Influenza A by PCR: NEGATIVE
Influenza B by PCR: NEGATIVE
Resp Syncytial Virus by PCR: NEGATIVE
SARS Coronavirus 2 by RT PCR: NEGATIVE

## 2023-01-08 LAB — TROPONIN I (HIGH SENSITIVITY): Troponin I (High Sensitivity): <2 ng/L (ref <18)

## 2023-01-08 LAB — LIPASE, BLOOD: Lipase: 32 U/L (ref 11–51)

## 2023-01-08 MED ORDER — ACETAMINOPHEN 325 MG PO TABS
650.0000 mg | ORAL_TABLET | Freq: Once | ORAL | Status: AC
  Administered 2023-01-08: 650 mg via ORAL
  Filled 2023-01-08: qty 2

## 2023-01-08 MED ORDER — ALBUTEROL SULFATE HFA 108 (90 BASE) MCG/ACT IN AERS
2.0000 | INHALATION_SPRAY | Freq: Once | RESPIRATORY_TRACT | Status: AC
  Administered 2023-01-08: 2 via RESPIRATORY_TRACT
  Filled 2023-01-08: qty 6.7

## 2023-01-08 NOTE — ED Notes (Signed)
Patient transported to CT 

## 2023-01-08 NOTE — Discharge Instructions (Signed)
Follow-up with your neurologist, follow-up with your primary care doctor.

## 2023-01-08 NOTE — ED Provider Notes (Signed)
[facility] Provider Note   CSN: [id number] Arrival date & time: 01/08/23  1209     History  Chief Complaint  Patient presents with   Chest Pain   Headache    JOSELINA HAWORTH is a 57 y.o. female.  Patient here with chest discomfort, cough, shortness of breath, headaches, memory issues.  History of sickle cell, hypertension, CAD, stroke, bipolar.  She is having bodyaches, joint pain headaches for the last few days.  Nothing makes it worse or better.  She thinks it could be a sickle cell flare.  She denies any exertional pain.  Nothing makes it worse or better.  Denies any weakness numbness tingling.  She questions if she is having hallucinations at times.  She does have a history of bipolar disorder.  Not hallucinating now.  The history is provided by the patient.       Home Medications Prior to Admission medications   Medication Sig Start Date End Date Taking? Authorizing Provider  albuterol (PROVENTIL HFA;VENTOLIN HFA) 108 (90 BASE) MCG/ACT inhaler Inhale 2 puffs into the lungs every 4 (four) hours as needed for shortness of breath. PROAIR    [provider]  ALPRAZolam Prudy Feeler) 1 MG tablet Take 1 mg by mouth 3 (three) times daily.    [provider]  amitriptyline (ELAVIL) 50 MG tablet Take 50 mg by mouth at bedtime.    [provider]  aripiprazole (ABILIFY) 15 MG disintegrating tablet Take 15 mg by mouth daily.    [provider]  aspirin EC 325 MG EC tablet Take 1 tablet (325 mg total) by mouth daily. 10/29/14   Kerrin Champagne, MD  busPIRone (BUSPAR) 10 MG tablet Take 10 mg by mouth 3 (three) times daily.    [provider]  clopidogrel (PLAVIX) 75 MG tablet Take 75 mg by mouth daily.    [provider]  furosemide (LASIX) 20 MG tablet Take 20 mg by mouth daily as needed for edema.     [provider]  gabapentin (NEURONTIN) 300 MG capsule Take 2 capsules (600 mg total)  by mouth 3 (three) times daily. 10/03/22 01/01/23  London Sheer, MD  hydroxyurea (HYDREA) 500 MG capsule Take 500 mg by mouth 2 (two) times daily. May take with food to minimize GI side effects.    [provider]  lisinopril (PRINIVIL,ZESTRIL) 20 MG tablet Take 20 mg by mouth daily.    [provider]  mirtazapine (REMERON) 45 MG tablet Take 45 mg by mouth at bedtime.  09/11/14   [provider]  nitroGLYCERIN (NITROSTAT) 0.4 MG SL tablet Place 0.4 mg under the tongue every 5 (five) minutes as needed for chest pain.    [provider]  OLANZapine (ZYPREXA) 20 MG tablet Take 20 mg by mouth at bedtime.    [provider]  polyethylene glycol powder (GLYCOLAX/MIRALAX) 17 GM/SCOOP powder DISSOLVE 17 GRAMS IN 8 OZ OF FLUID LIQUID DRINK DAILY AS DIRECTED 09/03/22   London Sheer, MD  promethazine (PHENERGAN) 25 MG tablet Take 1 tablet (25 mg total) by mouth every 8 (eight) hours as needed for nausea. 10/29/14   Kerrin Champagne, MD  QUEtiapine (SEROQUEL) 100 MG tablet Take 100 mg by mouth at bedtime.    [provider]  ranitidine (ZANTAC) 150 MG tablet Take 150 mg by mouth 3 (three) times daily.     [provider]  sodium phosphate (FLEET) 7-19 GM/118ML ENEM Place  133 mLs (1 enema total) rectally daily as needed for severe constipation. 09/14/22   London Sheer, MD  tiZANidine (ZANAFLEX) 4 MG tablet Take 1 tablet (4 mg total) by mouth every 6 (six) hours as needed (pain, muscle spasms). 10/03/22   London Sheer, MD  venlafaxine XR (EFFEXOR-XR) 150 MG 24 hr capsule Take 150 mg by mouth at bedtime.  09/11/14   [provider]      Allergies    Latex, Lidocaine, Metoclopramide hcl, Penicillins, Phenothiazines, Fentanyl, Hydromorphone hcl, Iodinated contrast media, Iohexol, Ketorolac tromethamine, Ondansetron hcl, and Prochlorperazine edisylate    Review of Systems   Review of Systems  Physical Exam Updated Vital Signs BP (!)  154/91   Pulse 87   Temp 98.3 F (36.8 C) (Oral)   Resp (!) 21   Ht 5\' 3"  (1.6 m)   Wt 95.3 kg   SpO2 93%   BMI 37.20 kg/m  Physical Exam Vitals and nursing note reviewed.  Constitutional:      General: She is not in acute distress.    Appearance: She is well-developed. She is not ill-appearing.  HENT:     Head: Normocephalic and atraumatic.  Eyes:     Extraocular Movements: Extraocular movements intact.     Conjunctiva/sclera: Conjunctivae normal.     Pupils: Pupils are equal, round, and reactive to light.  Cardiovascular:     Rate and Rhythm: Normal rate and regular rhythm.     Pulses:          Radial pulses are 2+ on the right side and 2+ on the left side.     Heart sounds: Normal heart sounds. No murmur heard. Pulmonary:     Effort: Pulmonary effort is normal. No respiratory distress.     Breath sounds: Wheezing present.  Abdominal:     Palpations: Abdomen is soft.     Tenderness: There is no abdominal tenderness.  Musculoskeletal:        General: No swelling. Normal range of motion.     Cervical back: Normal range of motion and neck supple.  Skin:    General: Skin is warm and dry.     Capillary Refill: Capillary refill takes less than 2 seconds.  Neurological:     General: No focal deficit present.     Mental Status: She is alert and oriented to person, place, and time.     Cranial Nerves: No cranial nerve deficit.     Motor: No weakness.     Comments: 5+ out of 5 strength throughout, normal sensation, no drift, normal finger-nose-finger, normal speech, normal gait  Psychiatric:        Mood and Affect: Mood normal.     ED Results / Procedures / Treatments   Labs (all labs ordered are listed, but only abnormal results are displayed) Labs Reviewed  BASIC METABOLIC PANEL - Abnormal; Notable for the following components:      Result Value   Glucose, Bld 118 (*)    Calcium 8.7 (*)    All other components within normal limits  CBC - Abnormal; Notable for the  following components:   Hemoglobin 10.4 (*)    HCT 32.5 (*)    MCV 69.1 (*)    MCH 22.1 (*)    RDW 16.8 (*)    All other components within normal limits  HEPATIC FUNCTION PANEL - Abnormal; Notable for the following components:   AST 14 (*)    Indirect Bilirubin 0.2 (*)  All other components within normal limits  RESP PANEL BY RT-PCR (RSV, FLU A&B, COVID)  [id number]  LIPASE, BLOOD  TROPONIN I (HIGH SENSITIVITY)    EKG EKG Interpretation Date/Time:  Tuesday January 08 2023 12:48:04 EDT Ventricular Rate:  102 PR Interval:  140 QRS Duration:  100 QT Interval:  357 QTC Calculation: 465 R Axis:   18  Text Interpretation: Sinus tachycardia Low voltage, precordial leads Confirmed by Virgina Norfolk [phone]) on 01/08/2023 12:51:30 PM  Radiology DG Chest Portable 1 View  Result Date: 01/08/2023 CLINICAL DATA:  chest pain States "it feels like an elephant is sitting in my chest. States having headache x 3 days along with hallucination and forgetting stuff. EXAM: PORTABLE CHEST 1 VIEW COMPARISON:  Chest x-ray 10/26/2014 FINDINGS: Right chest wall Port-A-Cath with tip overlying the expected region of the superior vena cava. The heart and mediastinal contours are unchanged. Aortic calcification. No focal consolidation. No pulmonary edema. No pleural effusion. No pneumothorax. No acute osseous abnormality. Vascular clips overlie the right chest. IMPRESSION: 1. No active disease. 2.  Aortic Atherosclerosis (ICD10-I70.0). Electronically Signed   By: Tish Frederickson M.D.   On: 01/08/2023 14:50   CT Head Wo Contrast  Result Date: 01/08/2023 CLINICAL DATA:  Headache, increasing frequency or severity EXAM: CT HEAD WITHOUT CONTRAST TECHNIQUE: Contiguous axial images were obtained from the base of the skull through the vertex without intravenous contrast. RADIATION DOSE REDUCTION: This exam was performed according to the departmental dose-optimization program which includes automated exposure control,  adjustment of the mA and/or kV according to patient size and/or use of iterative reconstruction technique. COMPARISON:  MRI head 08/17/2014, CT head 08/17/2014 FINDINGS: Brain: No evidence of large-territorial acute infarction. No parenchymal hemorrhage. No mass lesion. No extra-axial collection. No mass effect or midline shift. No hydrocephalus. Basilar cisterns are patent. Vascular: No hyperdense vessel. Skull: No acute fracture or focal lesion. Sinuses/Orbits: Paranasal sinuses and mastoid air cells are clear. The orbits are unremarkable. Other: None. IMPRESSION: No acute intracranial abnormality. Electronically Signed   By: Tish Frederickson M.D.   On: 01/08/2023 14:47    Procedures Procedures    Medications Ordered in ED Medications  albuterol (VENTOLIN HFA) 108 (90 Base) MCG/ACT inhaler 2 puff (2 puffs Inhalation Given 01/08/23 1355)  acetaminophen (TYLENOL) tablet 650 mg (650 mg Oral Given 01/08/23 1355)    ED Course/ Medical Decision Making/ A&P                                 Medical Decision Making Amount and/or Complexity of Data Reviewed Labs: ordered. Radiology: ordered.  Risk OTC drugs. Prescription drug management.   TAWNY KEMERER is here with multiple issues, cough, chest pain, shortness of breath, headache, memory issues, joint pain.  Per chart review looks like she has a history of somatizations disorder, depression, anxiety, beta thalassemia.  There is a question of CAD/stroke but she had MRI few years ago that did not show any evidence of stroke or old strokes.  Does not sound like she is ever had stent placed before.  Ultimately her story is not consistent with ACS or stroke.  She is got a little bit of wheezing on exam.  She is describing more viral symptoms to me.  Maybe some more chronic issues.  Overall differential could be viral process seems less likely to be ACS.  I have no concern for PE or stroke.  She is having some  ongoing headaches.  EKG shows sinus rhythm.   No ischemic changes.  I will be conservative and check labs and head imaging get CBC, CMP, lipase, troponin, chest x-ray, COVID and flu testing, CT head.  Will give breathing treatment, Tylenol and reevaluate.  Per my review and interpretation of labs no significant anemia or electrolyte abnormality kidney injury or leukocytosis.  Troponin is normal.  Chest x-ray per my review without any evidence of pneumonia or pneumothorax.  Head CT is unremarkable per radiology report.  She was feeling better.  However she started develop maybe some left arm weakness just after I had left the room telling her that her results were unremarkable and I was discharging her.  I went back in the room and overall she had a functional exam.  She has had multiple evaluations for similar symptoms in the past.  Multiple MRIs of the brain in the past with no evidence of stroke.  I reviewed old MRI reports.  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  She denies SI or HI.  When I did strength testing she did give any effort.  When I held her arm up above her head I felt great tone in her left upper arm and when I let go she did not drop the arm.  I got her up and she walked around the room well.  She asked for food and I gave her some ice cream and when I went back into the room she was holding the ice cream in her left hand eating with her right hand she is in no apparent distress.  Her neurological exam looks to be great and overall I do think that today's visit was more of stress related process or functional process.  She is feeling well.  I strongly encouraged her to follow-up with her neurologist and her primary care doctor.  She is discharged in good condition.  This chart was dictated using voice recognition software.  Despite best efforts to proofread,  errors can occur which can change the documentation meaning.         Final Clinical Impression(s) / ED Diagnoses Final diagnoses:   Nonspecific chest pain  Bad headache    Rx / DC Orders ED Discharge Orders     None         Virgina Norfolk, DO 01/08/23 1507

## 2023-01-08 NOTE — ED Notes (Signed)
ED Provider at bedside. 

## 2023-01-08 NOTE — ED Triage Notes (Signed)
States "it feels like an elephant is sitting in my chest.   States having headache x 3 days  along with hallucination and forgetting stuff.

## 2023-06-10 ENCOUNTER — Telehealth: Payer: Self-pay | Admitting: Orthopedic Surgery

## 2023-06-10 MED ORDER — METHOCARBAMOL 750 MG PO TABS: 750.0000 mg | ORAL_TABLET | Freq: Four times a day (QID) | ORAL | 2 refills | Status: DC | PRN

## 2023-06-10 NOTE — Telephone Encounter (Signed)
 Pt called requesting refill of methocarbamol . Pt appt not unit March 3. Please call pt at [phone].

## 2023-06-12 ENCOUNTER — Other Ambulatory Visit: Payer: Self-pay | Admitting: Radiology

## 2023-06-12 MED ORDER — CYCLOBENZAPRINE HCL 10 MG PO TABS: 10.0000 mg | ORAL_TABLET | Freq: Three times a day (TID) | ORAL | 2 refills | Status: DC | PRN

## 2023-07-01 ENCOUNTER — Ambulatory Visit: Payer: Medicare Other | Admitting: Orthopedic Surgery

## 2023-07-03 ENCOUNTER — Other Ambulatory Visit (INDEPENDENT_AMBULATORY_CARE_PROVIDER_SITE_OTHER): Payer: Self-pay

## 2023-07-03 ENCOUNTER — Ambulatory Visit (INDEPENDENT_AMBULATORY_CARE_PROVIDER_SITE_OTHER): Payer: Medicare Other | Admitting: Orthopedic Surgery

## 2023-07-03 DIAGNOSIS — Z981 Arthrodesis status: Secondary | ICD-10-CM

## 2023-07-03 MED ORDER — METHYLPREDNISOLONE 4 MG PO TBPK: ORAL_TABLET | ORAL | 0 refills | Status: AC

## 2023-07-03 MED ORDER — TIZANIDINE HCL 4 MG PO TABS: 4.0000 mg | ORAL_TABLET | Freq: Four times a day (QID) | ORAL | 0 refills | Status: DC | PRN

## 2023-07-03 NOTE — Progress Notes (Signed)
 Orthopedic Surgery Office Visit   Procedure: L4/5 laminectomy, right-sided foraminotomy, extension of fusion to L4 Date of Surgery: 08/31/2022 (~10 months post-op)   Assessment: Patient is a 58 y.o. who has developed low back pain that radiates into her left anterior thigh after a fall that occurred about 2 weeks ago     Plan: -No spine specific precautions -Prescribed tizanidine and medrol dose pak for her pain -If she is not doing any better in six weeks, would order an MRI to evaluate for radiculopathy -Pain management: per her pain management doctor -She coming from [place] so I told her that if it is still bother her in six weeks to come back otherwise she can return as needed   ___________________________________________________________________________     Subjective: Patient had been doing well after surgery until about 2 weeks ago. She had a fall and noted acute onset of low back pain with radiation into her left anterior thigh. It radiates to the level of the knee but does not radiate past it. She feels the pain with activity and rest. She has not tried any specific treatment for it yet but has been using her chronic pain medication. Denies paresthesias and numbness. No bowel or bladder incontinence. No saddle anesthesia.    Objective:   General: no acute distress, appropriate affect Neurologic: alert, answering questions appropriately, following commands Respiratory: unlabored breathing on room air Skin: incision is well healed   MSK (spine):   -Strength exam                                                   Left                  Right   EHL                              4/5                  4/5 TA                                 5/5                  5/5 GSC                             5/5                  5/5 Knee extension            5/5                  5/5 Hip flexion                    5/5                  5/5   -Sensory exam                           Sensation  intact to light touch in L3-S1 nerve distributions of bilateral lower extremities   Imaging: XRs of the lumbar spine from 07/03/2023 were independently  reviewed and interpreted, showing posterior instrumentation from L4-S1. No lucency seen around the screws. Interbody device at L5/S1 appears in appropriate position. No fracture or dislocation seen. No significant degenerative changes seen above her fusion. No evidence of instability on flexion/extension views.      Patient name: [name] Patient MRN: [id number] Date of visit: 07/03/23

## 2023-07-11 ENCOUNTER — Telehealth: Payer: Self-pay | Admitting: Orthopedic Surgery

## 2023-07-11 NOTE — Telephone Encounter (Signed)
 I called and spoke with patient, she wanted to know if Dr. Christell Constant did spinal taps, I advised that no he does not, and she asked if he could refer to someone that does them, she states that she needs CSF removed for her headaches. I advised that since the headaches is something that her PCP would address that the referral should come from them. She states that she understands and will call them.

## 2023-07-11 NOTE — Telephone Encounter (Signed)
 Pt called requesting call back from Dr Christell Constant. Pt has medical questions. Please call pt at [phone].

## 2023-08-01 ENCOUNTER — Telehealth: Payer: Self-pay | Admitting: Orthopedic Surgery

## 2023-08-01 MED ORDER — TIZANIDINE HCL 4 MG PO TABS: 4.0000 mg | ORAL_TABLET | Freq: Four times a day (QID) | ORAL | 1 refills | Status: DC | PRN

## 2023-08-01 NOTE — Telephone Encounter (Signed)
Rx refill Tizanidine

## 2023-08-13 ENCOUNTER — Other Ambulatory Visit: Payer: Self-pay | Admitting: Orthopedic Surgery

## 2023-08-14 ENCOUNTER — Ambulatory Visit: Admitting: Orthopedic Surgery

## 2023-09-09 ENCOUNTER — Ambulatory Visit: Admitting: Orthopedic Surgery

## 2023-09-11 ENCOUNTER — Ambulatory Visit: Admitting: Orthopedic Surgery

## 2023-09-13 ENCOUNTER — Ambulatory Visit: Admitting: Orthopedic Surgery

## 2023-10-10 ENCOUNTER — Other Ambulatory Visit (INDEPENDENT_AMBULATORY_CARE_PROVIDER_SITE_OTHER): Payer: Self-pay

## 2023-10-10 ENCOUNTER — Ambulatory Visit: Admitting: Orthopedic Surgery

## 2023-10-10 DIAGNOSIS — Z981 Arthrodesis status: Secondary | ICD-10-CM

## 2023-10-10 NOTE — Progress Notes (Signed)
 Orthopedic Surgery Office Visit   Procedure: L4/5 laminectomy, right-sided foraminotomy, extension of fusion to L4 Date of Surgery: 08/31/2022 (~1 year post-op)   Assessment: Patient is a 58 y.o. who is doing well. Feels things have improved since she was last seen in the office     Plan: -No spine specific precautions -If her leg numbness persists, will order an MRI of the lumbar spine to evaluate further -Pain management: per her pain management doctor -Told her if her leg numbness does not resolve with the aquatic therapy and time, she should call so I can order an MRI -Follow up in 1 year with AP/lateral/flex/ex lumbar   ___________________________________________________________________________     Subjective: Patient has been doing well since she was last seen in the office. She has some chronic low back pain that is well controlled with her pain management doctor. No radiating leg pain. Has had two episodes where her legs go numb and she fell. Outside of these two episodes, she does not have any numbness or paresthesias in her legs. Feels her symptoms have improved since she was last seen in the office. She has been ambulating without assistive devices.No bowel or bladder incontinence. No saddle anesthesia.    Objective:   General: no acute distress, appropriate affect Neurologic: alert, answering questions appropriately, following commands Respiratory: unlabored breathing on room air Skin: incision is well healed   MSK (spine):   -Strength exam                                                   Left                  Right   EHL                              4/5                  4/5 TA                                 5/5                  5/5 GSC                             5/5                  5/5 Knee extension            5/5                  5/5 Hip flexion                    5/5                  5/5   -Sensory exam                           Sensation intact to light  touch in L3-S1 nerve distributions of bilateral lower extremities  -Negative hoffman, negative romberg, no beats of clonus on either leg, gait is normal without assistive devices, negative  heel to shin test   Imaging: XRs of the lumbar spine from 10/10/2023 were independently reviewed and interpreted, showing posterior instrumentation from L4-S1. Screws in appropriate position with no lucency around the screws. None of the screws have backed out. No significant adjacent segment disease seen. No evidence of instability on flexion/extension views. No fracture or dislocation seen.      Patient name: [name] Patient MRN: [id number] Date of visit: 10/10/23

## 2023-11-11 ENCOUNTER — Other Ambulatory Visit: Payer: Self-pay | Admitting: Orthopedic Surgery

## 2024-03-02 ENCOUNTER — Encounter: Payer: Self-pay | Admitting: Radiology

## 2024-03-24 ENCOUNTER — Other Ambulatory Visit: Payer: Self-pay | Admitting: Orthopedic Surgery

## 2024-04-03 ENCOUNTER — Other Ambulatory Visit: Payer: Self-pay | Admitting: Orthopedic Surgery

## 2024-10-08 ENCOUNTER — Ambulatory Visit: Admitting: Orthopedic Surgery
